# Patient Record
Sex: Male | Born: 1960 | Race: Black or African American | Hispanic: No | Marital: Single
Health system: Southern US, Community
[De-identification: ages and names within clinical notes are randomized; demographics above are authoritative.]

## PROBLEM LIST (undated history)

## (undated) ENCOUNTER — Inpatient Hospital Stay (HOSPITAL_REHABILITATION): Admission: RE | Payer: Self-pay | Admitting: Physical Medicine and Rehabilitation

## (undated) DIAGNOSIS — S069XAA Unspecified intracranial injury with loss of consciousness status unknown, initial encounter: Secondary | ICD-10-CM

## (undated) DIAGNOSIS — M1990 Unspecified osteoarthritis, unspecified site: Secondary | ICD-10-CM

## (undated) DIAGNOSIS — F1910 Other psychoactive substance abuse, uncomplicated: Secondary | ICD-10-CM

## (undated) DIAGNOSIS — G4700 Insomnia, unspecified: Secondary | ICD-10-CM

## (undated) DIAGNOSIS — J302 Other seasonal allergic rhinitis: Secondary | ICD-10-CM

## (undated) DIAGNOSIS — F10129 Alcohol abuse with intoxication, unspecified: Secondary | ICD-10-CM

## (undated) DIAGNOSIS — F32A Depression, unspecified: Secondary | ICD-10-CM

## (undated) DIAGNOSIS — F39 Unspecified mood [affective] disorder: Secondary | ICD-10-CM

## (undated) DIAGNOSIS — F419 Anxiety disorder, unspecified: Secondary | ICD-10-CM

## (undated) DIAGNOSIS — K219 Gastro-esophageal reflux disease without esophagitis: Secondary | ICD-10-CM

## (undated) HISTORY — DX: Anxiety disorder, unspecified: F41.9

## (undated) HISTORY — DX: Other psychoactive substance abuse, uncomplicated: F19.10

## (undated) HISTORY — DX: Insomnia, unspecified: G47.00

## (undated) HISTORY — DX: Unspecified osteoarthritis, unspecified site: M19.90

## (undated) HISTORY — DX: Unspecified intracranial injury with loss of consciousness status unknown, initial encounter: S06.9XAA

## (undated) HISTORY — DX: Alcohol abuse with intoxication, unspecified: F10.129

## (undated) HISTORY — DX: Other seasonal allergic rhinitis: J30.2

## (undated) HISTORY — DX: Unspecified mood (affective) disorder: F39

## (undated) HISTORY — DX: Gastro-esophageal reflux disease without esophagitis: K21.9

## (undated) HISTORY — DX: Depression, unspecified: F32.A

---

## 2005-05-14 ENCOUNTER — Emergency Department (HOSPITAL_COMMUNITY): Admission: EM | Admit: 2005-05-14 | Discharge: 2005-05-14 | Payer: Self-pay | Admitting: Emergency Medicine

## 2005-05-21 ENCOUNTER — Emergency Department (HOSPITAL_COMMUNITY): Admission: EM | Admit: 2005-05-21 | Discharge: 2005-05-21 | Payer: Self-pay | Admitting: Family Medicine

## 2006-05-02 ENCOUNTER — Encounter: Admission: RE | Admit: 2006-05-02 | Discharge: 2006-05-02 | Payer: Self-pay | Admitting: Occupational Medicine

## 2006-09-09 ENCOUNTER — Emergency Department (HOSPITAL_COMMUNITY): Admission: EM | Admit: 2006-09-09 | Discharge: 2006-09-09 | Payer: Self-pay | Admitting: Family Medicine

## 2013-01-31 ENCOUNTER — Emergency Department (HOSPITAL_COMMUNITY): Payer: Self-pay

## 2013-01-31 ENCOUNTER — Emergency Department (HOSPITAL_COMMUNITY)
Admission: EM | Admit: 2013-01-31 | Discharge: 2013-01-31 | Disposition: A | Discharge status: Home / Self Care | Payer: Self-pay | Attending: Emergency Medicine | Admitting: Emergency Medicine

## 2013-01-31 ENCOUNTER — Encounter (HOSPITAL_COMMUNITY): Payer: Self-pay | Admitting: Emergency Medicine

## 2013-01-31 DIAGNOSIS — S0180XA Unspecified open wound of other part of head, initial encounter: Secondary | ICD-10-CM | POA: Insufficient documentation

## 2013-01-31 DIAGNOSIS — F17200 Nicotine dependence, unspecified, uncomplicated: Secondary | ICD-10-CM | POA: Insufficient documentation

## 2013-01-31 DIAGNOSIS — S0285XA Fracture of orbit, unspecified, initial encounter for closed fracture: Secondary | ICD-10-CM

## 2013-01-31 DIAGNOSIS — S02401A Maxillary fracture, unspecified, initial encounter for closed fracture: Secondary | ICD-10-CM

## 2013-01-31 DIAGNOSIS — S02109A Fracture of base of skull, unspecified side, initial encounter for closed fracture: Secondary | ICD-10-CM | POA: Insufficient documentation

## 2013-01-31 DIAGNOSIS — S0280XA Fracture of other specified skull and facial bones, unspecified side, initial encounter for closed fracture: Secondary | ICD-10-CM | POA: Insufficient documentation

## 2013-01-31 LAB — URINALYSIS, ROUTINE W REFLEX MICROSCOPIC
Bilirubin Urine: NEGATIVE
Hgb urine dipstick: NEGATIVE
Specific Gravity, Urine: 1.028 (ref 1.005–1.030)
pH: 5.5 (ref 5.0–8.0)

## 2013-01-31 LAB — URINE MICROSCOPIC-ADD ON

## 2013-01-31 MED ORDER — OXYCODONE-ACETAMINOPHEN 5-325 MG PO TABS
1.0000 | ORAL_TABLET | Freq: Once | ORAL | Status: AC
  Administered 2013-01-31: 1 via ORAL
  Filled 2013-01-31: qty 1

## 2013-01-31 MED ORDER — AMOXICILLIN-POT CLAVULANATE 875-125 MG PO TABS: 1.0000 | ORAL_TABLET | Freq: Two times a day (BID) | ORAL | Status: DC

## 2013-01-31 MED ORDER — HYDROCODONE-ACETAMINOPHEN 5-325 MG PO TABS: 1.0000 | ORAL_TABLET | ORAL | Status: DC | PRN
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
## 2013-01-31 NOTE — ED Provider Notes (Signed)
Medical screening examination/treatment/procedure(s) were conducted as a shared visit with non-physician practitioner(s) and myself.  I personally evaluated the patient during the encounter  No significant displaced fractures that demonstrate need for operative repair at this time.  Outpatient followup with maxillofacial surgery.  Lyanne Co, MD 01/31/13 [phone]
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
## 2015-06-15 ENCOUNTER — Emergency Department (HOSPITAL_COMMUNITY): Payer: Self-pay

## 2015-06-15 ENCOUNTER — Emergency Department (HOSPITAL_COMMUNITY)
Admission: EM | Admit: 2015-06-15 | Discharge: 2015-06-15 | Disposition: A | Discharge status: Home / Self Care | Payer: Self-pay | Attending: Emergency Medicine | Admitting: Emergency Medicine

## 2015-06-15 ENCOUNTER — Encounter (HOSPITAL_COMMUNITY): Payer: Self-pay | Admitting: *Deleted

## 2015-06-15 DIAGNOSIS — S39012A Strain of muscle, fascia and tendon of lower back, initial encounter: Secondary | ICD-10-CM

## 2015-06-15 DIAGNOSIS — Y990 Civilian activity done for income or pay: Secondary | ICD-10-CM | POA: Insufficient documentation

## 2015-06-15 DIAGNOSIS — S8991XA Unspecified injury of right lower leg, initial encounter: Secondary | ICD-10-CM | POA: Insufficient documentation

## 2015-06-15 DIAGNOSIS — Y9289 Other specified places as the place of occurrence of the external cause: Secondary | ICD-10-CM | POA: Insufficient documentation

## 2015-06-15 DIAGNOSIS — F17210 Nicotine dependence, cigarettes, uncomplicated: Secondary | ICD-10-CM | POA: Insufficient documentation

## 2015-06-15 DIAGNOSIS — X500XXA Overexertion from strenuous movement or load, initial encounter: Secondary | ICD-10-CM | POA: Insufficient documentation

## 2015-06-15 DIAGNOSIS — G8929 Other chronic pain: Secondary | ICD-10-CM | POA: Insufficient documentation

## 2015-06-15 DIAGNOSIS — Y9389 Activity, other specified: Secondary | ICD-10-CM | POA: Insufficient documentation

## 2015-06-15 MED ORDER — CYCLOBENZAPRINE HCL 10 MG PO TABS: 10.0000 mg | ORAL_TABLET | Freq: Two times a day (BID) | ORAL | Status: DC | PRN

## 2015-06-15 MED ORDER — NAPROXEN 500 MG PO TABS: 500.0000 mg | ORAL_TABLET | Freq: Two times a day (BID) | ORAL | Status: DC

## 2015-06-15 MED ORDER — CYCLOBENZAPRINE HCL 10 MG PO TABS
10.0000 mg | ORAL_TABLET | Freq: Once | ORAL | Status: AC
  Administered 2015-06-15: 10 mg via ORAL
  Filled 2015-06-15: qty 1

## 2015-06-15 MED ORDER — OXYCODONE-ACETAMINOPHEN 5-325 MG PO TABS: 2.0000 | ORAL_TABLET | ORAL | Status: DC | PRN

## 2015-06-15 MED ORDER — OXYCODONE-ACETAMINOPHEN 5-325 MG PO TABS
1.0000 | ORAL_TABLET | Freq: Once | ORAL | Status: AC
  Administered 2015-06-15: 1 via ORAL
  Filled 2015-06-15: qty 1

## 2015-06-15 NOTE — ED Notes (Signed)
Pt reports while moving a refrigerator  On Tuesday his back started to hurt and the pain goes down the RT leg.

## 2015-06-15 NOTE — ED Notes (Signed)
Declined W/C at D/C and was escorted to lobby by RN. 
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
## 2015-06-15 NOTE — Discharge Instructions (Signed)
Low Back Strain With Rehab A strain is an injury in which a tendon or muscle is torn. The muscles and tendons of the lower back are vulnerable to strains. However, these muscles and tendons are very strong and require a great force to be injured. Strains are classified into three categories. Grade 1 strains cause pain, but the tendon is not lengthened. Grade 2 strains include a lengthened ligament, due to the ligament being stretched or partially ruptured. With grade 2 strains there is still function, although the function may be decreased. Grade 3 strains involve a complete tear of the tendon or muscle, and function is usually impaired. SYMPTOMS   Pain in the lower back.  Pain that affects one side more than the other.  Pain that gets worse with movement and may be felt in the hip, buttocks, or back of the thigh.  Muscle spasms of the muscles in the back.  Swelling along the muscles of the back.  Loss of strength of the back muscles.  Crackling sound (crepitation) when the muscles are touched. CAUSES  Lower back strains occur when a force is placed on the muscles or tendons that is greater than they can handle. Common causes of injury include:  Prolonged overuse of the muscle-tendon units in the lower back, usually from incorrect posture.  A single violent injury or force applied to the back. RISK INCREASES WITH:  Sports that involve twisting forces on the spine or a lot of bending at the waist (football, rugby, weightlifting, bowling, golf, tennis, speed skating, racquetball, swimming, running, gymnastics, diving).  Poor strength and flexibility.  Failure to warm up properly before activity.  Family history of lower back pain or disk disorders.  Previous back injury or surgery (especially fusion).  Poor posture with lifting, especially heavy objects.  Prolonged sitting, especially with poor posture. PREVENTION   Learn and use proper posture when sitting or lifting (maintain  proper posture when sitting, lift using the knees and legs, not at the waist).  Warm up and stretch properly before activity.  Allow for adequate recovery between workouts.  Maintain physical fitness:  Strength, flexibility, and endurance.  Cardiovascular fitness. PROGNOSIS  If treated properly, lower back strains usually heal within 6 weeks. RELATED COMPLICATIONS   Recurring symptoms, resulting in a chronic problem.  Chronic inflammation, scarring, and partial muscle-tendon tear.  Delayed healing or resolution of symptoms.  Prolonged disability. TREATMENT  Treatment first involves the use of ice and medicine, to reduce pain and inflammation. The use of strengthening and stretching exercises may help reduce pain with activity. These exercises may be performed at home or with a therapist. Severe injuries may require referral to a therapist for further evaluation and treatment, such as ultrasound. Your caregiver may advise that you wear a back brace or corset, to help reduce pain and discomfort. Often, prolonged bed rest results in greater harm then benefit. Corticosteroid injections may be recommended. However, these should be reserved for the most serious cases. It is important to avoid using your back when lifting objects. At night, sleep on your back on a firm mattress with a pillow placed under your knees. If non-surgical treatment is unsuccessful, surgery may be needed.  MEDICATION   If pain medicine is needed, nonsteroidal anti-inflammatory medicines (aspirin and ibuprofen), or other minor pain relievers (acetaminophen), are often advised.  Do not take pain medicine for 7 days before surgery.  Prescription pain relievers may be given, if your caregiver thinks they are needed. Use only as  directed and only as much as you need.  Ointments applied to the skin may be helpful.  Corticosteroid injections may be given by your caregiver. These injections should be reserved for the most  serious cases, because they may only be given a certain number of times. HEAT AND COLD  Cold treatment (icing) should be applied for 10 to 15 minutes every 2 to 3 hours for inflammation and pain, and immediately after activity that aggravates your symptoms. Use ice packs or an ice massage.  Heat treatment may be used before performing stretching and strengthening activities prescribed by your caregiver, physical therapist, or athletic trainer. Use a heat pack or a warm water soak. SEEK MEDICAL CARE IF:   Symptoms get worse or do not improve in 2 to 4 weeks, despite treatment.  You develop numbness, weakness, or loss of bowel or bladder function.  New, unexplained symptoms develop. (Drugs used in treatment may produce side effects.) EXERCISES  RANGE OF MOTION (ROM) AND STRETCHING EXERCISES - Low Back Strain Most people with lower back pain will find that their symptoms get worse with excessive bending forward (flexion) or arching at the lower back (extension). The exercises which will help resolve your symptoms will focus on the opposite motion.  Your physician, physical therapist or athletic trainer will help you determine which exercises will be most helpful to resolve your lower back pain. Do not complete any exercises without first consulting with your caregiver. Discontinue any exercises which make your symptoms worse until you speak to your caregiver.  If you have pain, numbness or tingling which travels down into your buttocks, leg or foot, the goal of the therapy is for these symptoms to move closer to your back and eventually resolve. Sometimes, these leg symptoms will get better, but your lower back pain may worsen. This is typically an indication of progress in your rehabilitation. Be very alert to any changes in your symptoms and the activities in which you participated in the 24 hours prior to the change. Sharing this information with your caregiver will allow him/her to most efficiently  treat your condition.  These exercises may help you when beginning to rehabilitate your injury. Your symptoms may resolve with or without further involvement from your physician, physical therapist or athletic trainer. While completing these exercises, remember:  Restoring tissue flexibility helps normal motion to return to the joints. This allows healthier, less painful movement and activity.  An effective stretch should be held for at least 30 seconds.  A stretch should never be painful. You should only feel a gentle lengthening or release in the stretched tissue. FLEXION RANGE OF MOTION AND STRETCHING EXERCISES: STRETCH - Flexion, Single Knee to Chest   Lie on a firm bed or floor with both legs extended in front of you.  Keeping one leg in contact with the floor, bring your opposite knee to your chest. Hold your leg in place by either grabbing behind your thigh or at your knee.  Pull until you feel a gentle stretch in your lower back. Hold __________ seconds.  Slowly release your grasp and repeat the exercise with the opposite side. Repeat __________ times. Complete this exercise __________ times per day.  STRETCH - Flexion, Double Knee to Chest   Lie on a firm bed or floor with both legs extended in front of you.  Keeping one leg in contact with the floor, bring your opposite knee to your chest.  Tense your stomach muscles to support your back and then   lift your other knee to your chest. Hold your legs in place by either grabbing behind your thighs or at your knees.  Pull both knees toward your chest until you feel a gentle stretch in your lower back. Hold __________ seconds.  Tense your stomach muscles and slowly return one leg at a time to the floor. Repeat __________ times. Complete this exercise __________ times per day.  STRETCH - Low Trunk Rotation  Lie on a firm bed or floor. Keeping your legs in front of you, bend your knees so they are both pointed toward the ceiling  and your feet are flat on the floor.  Extend your arms out to the side. This will stabilize your upper body by keeping your shoulders in contact with the floor.  Gently and slowly drop both knees together to one side until you feel a gentle stretch in your lower back. Hold for __________ seconds.  Tense your stomach muscles to support your lower back as you bring your knees back to the starting position. Repeat the exercise to the other side. Repeat __________ times. Complete this exercise __________ times per day  EXTENSION RANGE OF MOTION AND FLEXIBILITY EXERCISES: STRETCH - Extension, Prone on Elbows   Lie on your stomach on the floor, a bed will be too soft. Place your palms about shoulder width apart and at the height of your head.  Place your elbows under your shoulders. If this is too painful, stack pillows under your chest.  Allow your body to relax so that your hips drop lower and make contact more completely with the floor.  Hold this position for __________ seconds.  Slowly return to lying flat on the floor. Repeat __________ times. Complete this exercise __________ times per day.  RANGE OF MOTION - Extension, Prone Press Ups  Lie on your stomach on the floor, a bed will be too soft. Place your palms about shoulder width apart and at the height of your head.  Keeping your back as relaxed as possible, slowly straighten your elbows while keeping your hips on the floor. You may adjust the placement of your hands to maximize your comfort. As you gain motion, your hands will come more underneath your shoulders.  Hold this position __________ seconds.  Slowly return to lying flat on the floor. Repeat __________ times. Complete this exercise __________ times per day.  RANGE OF MOTION- Quadruped, Neutral Spine   Assume a hands and knees position on a firm surface. Keep your hands under your shoulders and your knees under your hips. You may place padding under your knees for  comfort.  Drop your head and point your tail bone toward the ground below you. This will round out your lower back like an angry cat. Hold this position for __________ seconds.  Slowly lift your head and release your tail bone so that your back sags into a large arch, like an old horse.  Hold this position for __________ seconds.  Repeat this until you feel limber in your lower back.  Now, find your "sweet spot." This will be the most comfortable position somewhere between the two previous positions. This is your neutral spine. Once you have found this position, tense your stomach muscles to support your lower back.  Hold this position for __________ seconds. Repeat __________ times. Complete this exercise __________ times per day.  STRENGTHENING EXERCISES - Low Back Strain These exercises may help you when beginning to rehabilitate your injury. These exercises should be done near your "sweet   spot." This is the neutral, low-back arch, somewhere between fully rounded and fully arched, that is your least painful position. When performed in this safe range of motion, these exercises can be used for people who have either a flexion or extension based injury. These exercises may resolve your symptoms with or without further involvement from your physician, physical therapist or athletic trainer. While completing these exercises, remember:  °· Muscles can gain both the endurance and the strength needed for everyday activities through controlled exercises. °· Complete these exercises as instructed by your physician, physical therapist or athletic trainer. Increase the resistance and repetitions only as guided. °· You may experience muscle soreness or fatigue, but the pain or discomfort you are trying to eliminate should never worsen during these exercises. If this pain does worsen, stop and make certain you are following the directions exactly. If the pain is still present after adjustments, discontinue the  exercise until you can discuss the trouble with your caregiver. °STRENGTHENING - Deep Abdominals, Pelvic Tilt °· Lie on a firm bed or floor. Keeping your legs in front of you, bend your knees so they are both pointed toward the ceiling and your feet are flat on the floor. °· Tense your lower abdominal muscles to press your lower back into the floor. This motion will rotate your pelvis so that your tail bone is scooping upwards rather than pointing at your feet or into the floor. °· With a gentle tension and even breathing, hold this position for __________ seconds. °Repeat __________ times. Complete this exercise __________ times per day.  °STRENGTHENING - Abdominals, Crunches  °· Lie on a firm bed or floor. Keeping your legs in front of you, bend your knees so they are both pointed toward the ceiling and your feet are flat on the floor. Cross your arms over your chest. °· Slightly tip your chin down without bending your neck. °· Tense your abdominals and slowly lift your trunk high enough to just clear your shoulder blades. Lifting higher can put excessive stress on the lower back and does not further strengthen your abdominal muscles. °· Control your return to the starting position. °Repeat __________ times. Complete this exercise __________ times per day.  °STRENGTHENING - Quadruped, Opposite UE/LE Lift  °· Assume a hands and knees position on a firm surface. Keep your hands under your shoulders and your knees under your hips. You may place padding under your knees for comfort. °· Find your neutral spine and gently tense your abdominal muscles so that you can maintain this position. Your shoulders and hips should form a rectangle that is parallel with the floor and is not twisted. °· Keeping your trunk steady, lift your right hand no higher than your shoulder and then your left leg no higher than your hip. Make sure you are not holding your breath. Hold this position __________ seconds. °· Continuing to keep your  abdominal muscles tense and your back steady, slowly return to your starting position. Repeat with the opposite arm and leg. °Repeat __________ times. Complete this exercise __________ times per day.  °STRENGTHENING - Lower Abdominals, Double Knee Lift °· Lie on a firm bed or floor. Keeping your legs in front of you, bend your knees so they are both pointed toward the ceiling and your feet are flat on the floor. °· Tense your abdominal muscles to brace your lower back and slowly lift both of your knees until they come over your hips. Be certain not to hold your breath. °·   Hold __________ seconds. Using your abdominal muscles, return to the starting position in a slow and controlled manner. Repeat __________ times. Complete this exercise __________ times per day.  POSTURE AND BODY MECHANICS CONSIDERATIONS - Low Back Strain Keeping correct posture when sitting, standing or completing your activities will reduce the stress put on different body tissues, allowing injured tissues a chance to heal and limiting painful experiences. The following are general guidelines for improved posture. Your physician or physical therapist will provide you with any instructions specific to your needs. While reading these guidelines, remember:  The exercises prescribed by your provider will help you have the flexibility and strength to maintain correct postures.  The correct posture provides the best environment for your joints to work. All of your joints have less wear and tear when properly supported by a spine with good posture. This means you will experience a healthier, less painful body.  Correct posture must be practiced with all of your activities, especially prolonged sitting and standing. Correct posture is as important when doing repetitive low-stress activities (typing) as it is when doing a single heavy-load activity (lifting). RESTING POSITIONS Consider which positions are most painful for you when choosing a  resting position. If you have pain with flexion-based activities (sitting, bending, stooping, squatting), choose a position that allows you to rest in a less flexed posture. You would want to avoid curling into a fetal position on your side. If your pain worsens with extension-based activities (prolonged standing, working overhead), avoid resting in an extended position such as sleeping on your stomach. Most people will find more comfort when they rest with their spine in a more neutral position, neither too rounded nor too arched. Lying on a non-sagging bed on your side with a pillow between your knees, or on your back with a pillow under your knees will often provide some relief. Keep in mind, being in any one position for a prolonged period of time, no matter how correct your posture, can still lead to stiffness. PROPER SITTING POSTURE In order to minimize stress and discomfort on your spine, you must sit with correct posture. Sitting with good posture should be effortless for a healthy body. Returning to good posture is a gradual process. Many people can work toward this most comfortably by using various supports until they have the flexibility and strength to maintain this posture on their own. When sitting with proper posture, your ears will fall over your shoulders and your shoulders will fall over your hips. You should use the back of the chair to support your upper back. Your lower back will be in a neutral position, just slightly arched. You may place a small pillow or folded towel at the base of your lower back for support.  When working at a desk, create an environment that supports good, upright posture. Without extra support, muscles tire, which leads to excessive strain on joints and other tissues. Keep these recommendations in mind: CHAIR:  A chair should be able to slide under your desk when your back makes contact with the back of the chair. This allows you to work closely.  The chair's  height should allow your eyes to be level with the upper part of your monitor and your hands to be slightly lower than your elbows. BODY POSITION  Your feet should make contact with the floor. If this is not possible, use a foot rest.  Keep your ears over your shoulders. This will reduce stress on your neck and  lower back. INCORRECT SITTING POSTURES  If you are feeling tired and unable to assume a healthy sitting posture, do not slouch or slump. This puts excessive strain on your back tissues, causing more damage and pain. Healthier options include:  Using more support, like a lumbar pillow.  Switching tasks to something that requires you to be upright or walking.  Talking a brief walk.  Lying down to rest in a neutral-spine position. PROLONGED STANDING WHILE SLIGHTLY LEANING FORWARD  When completing a task that requires you to lean forward while standing in one place for a long time, place either foot up on a stationary 2-4 inch high object to help maintain the best posture. When both feet are on the ground, the lower back tends to lose its slight inward curve. If this curve flattens (or becomes too large), then the back and your other joints will experience too much stress, tire more quickly, and can cause pain. CORRECT STANDING POSTURES Proper standing posture should be assumed with all daily activities, even if they only take a few moments, like when brushing your teeth. As in sitting, your ears should fall over your shoulders and your shoulders should fall over your hips. You should keep a slight tension in your abdominal muscles to brace your spine. Your tailbone should point down to the ground, not behind your body, resulting in an over-extended swayback posture.  INCORRECT STANDING POSTURES  Common incorrect standing postures include a forward head, locked knees and/or an excessive swayback. WALKING Walk with an upright posture. Your ears, shoulders and hips should all  line-up. PROLONGED ACTIVITY IN A FLEXED POSITION When completing a task that requires you to bend forward at your waist or lean over a low surface, try to find a way to stabilize 3 out of 4 of your limbs. You can place a hand or elbow on your thigh or rest a knee on the surface you are reaching across. This will provide you more stability so that your muscles do not fatigue as quickly. By keeping your knees relaxed, or slightly bent, you will also reduce stress across your lower back. CORRECT LIFTING TECHNIQUES DO :   Assume a wide stance. This will provide you more stability and the opportunity to get as close as possible to the object which you are lifting.  Tense your abdominals to brace your spine. Bend at the knees and hips. Keeping your back locked in a neutral-spine position, lift using your leg muscles. Lift with your legs, keeping your back straight.  Test the weight of unknown objects before attempting to lift them.  Try to keep your elbows locked down at your sides in order get the best strength from your shoulders when carrying an object.  Always ask for help when lifting heavy or awkward objects. INCORRECT LIFTING TECHNIQUES DO NOT:   Lock your knees when lifting, even if it is a small object.  Bend and twist. Pivot at your feet or move your feet when needing to change directions.  Assume that you can safely pick up even a paper clip without proper posture.   This information is not intended to replace advice given to you by your health care provider. Make sure you discuss any questions you have with your health care provider.  Take pain medication and muscle relaxers as needed. Apply ice to affected area. Avoid heavy lifting or strenuous activity to allow your back muscles to heal. Follow-up with [facility] for symptoms do not  improve. Return to the emergency department if you experience severe worsening of her symptoms, numbness or tingling in  both of your lower extremities, loss of bowel or bladder control, fever, weakness.

## 2017-03-03 ENCOUNTER — Emergency Department (HOSPITAL_COMMUNITY): Payer: Self-pay

## 2017-03-03 ENCOUNTER — Encounter (HOSPITAL_COMMUNITY): Payer: Self-pay | Admitting: Emergency Medicine

## 2017-03-03 ENCOUNTER — Other Ambulatory Visit: Payer: Self-pay

## 2017-03-03 ENCOUNTER — Emergency Department (HOSPITAL_COMMUNITY)
Admission: EM | Admit: 2017-03-03 | Discharge: 2017-03-03 | Disposition: A | Discharge status: Home / Self Care | Payer: Self-pay | Attending: Physician Assistant | Admitting: Physician Assistant

## 2017-03-03 DIAGNOSIS — F17210 Nicotine dependence, cigarettes, uncomplicated: Secondary | ICD-10-CM | POA: Insufficient documentation

## 2017-03-03 DIAGNOSIS — Z79899 Other long term (current) drug therapy: Secondary | ICD-10-CM | POA: Insufficient documentation

## 2017-03-03 DIAGNOSIS — M5441 Lumbago with sciatica, right side: Secondary | ICD-10-CM | POA: Insufficient documentation

## 2017-03-03 MED ORDER — METHYLPREDNISOLONE 4 MG PO TBPK
ORAL_TABLET | ORAL | 0 refills | Status: DC
Start: 2017-03-03 — End: 2021-11-13

## 2017-03-03 MED ORDER — OXYCODONE-ACETAMINOPHEN 5-325 MG PO TABS
1.0000 | ORAL_TABLET | Freq: Once | ORAL | Status: AC
  Administered 2017-03-03: 1 via ORAL
  Filled 2017-03-03: qty 1

## 2017-03-03 MED ORDER — CYCLOBENZAPRINE HCL 10 MG PO TABS
10.0000 mg | ORAL_TABLET | Freq: Once | ORAL | Status: AC
  Administered 2017-03-03: 10 mg via ORAL
  Filled 2017-03-03: qty 1

## 2017-03-03 MED ORDER — PREDNISONE 20 MG PO TABS
40.0000 mg | ORAL_TABLET | Freq: Once | ORAL | Status: AC
  Administered 2017-03-03: 40 mg via ORAL
  Filled 2017-03-03: qty 2

## 2017-03-03 MED ORDER — CYCLOBENZAPRINE HCL 10 MG PO TABS: 10.0000 mg | ORAL_TABLET | Freq: Two times a day (BID) | ORAL | 0 refills | Status: DC | PRN

## 2017-03-03 NOTE — ED Provider Notes (Signed)
[facility] Note   CSN: [id number] Arrival date & time: 03/03/17  1551     History   Chief Complaint Chief Complaint  Patient presents with  . Back Pain    HPI  Bill Brooks is a 56 y.o. Male with a history of chronic low back pain, presents complaining of right-sided back pain that radiates down his right leg.  Patient reports he works at [employer] and was lifting heavy boxes at work 2-3 days ago and since then he has been having worsening pain.  He describes pain as a constant ache that has been worsening, is tried Tylenol and heating pads with mild improvement but no complete relief.  Patient reports particularly with movement and forward bending pain shoots down his right leg.  He denies numbness, weakness or tingling of the right leg.  Reports he is been able to walk but it is painful.  Symptoms at all on the left side of the back or radiating down to the left leg. Denies weakness, numbness, loss of bowel/bladder function or saddle anesthesia.  Denies fevers or chills.  No abdominal pain, dysuria or frequency.  Patient denies any history of cancer or IV drug use.      History reviewed. No pertinent past medical history.  There are no active problems to display for this patient.   History reviewed. No pertinent surgical history.     Home Medications    Prior to Admission medications   Medication Sig Start Date End Date Taking? Authorizing Provider  amoxicillin-clavulanate (AUGMENTIN) 875-125 MG per tablet Take 1 tablet by mouth every 12 (twelve) hours. 01/31/13   Schinlever, Santina Evansatherine, PA-C  cyclobenzaprine (FLEXERIL) 10 MG tablet Take 1 tablet (10 mg total) by mouth 2 (two) times daily as needed for muscle spasms. 06/15/15   Dowless, Lelon MastSamantha Tripp, PA-C  HYDROcodone-acetaminophen (NORCO/VICODIN) 5-325 MG per tablet Take 1 tablet by mouth every 4 (four) hours as needed for moderate pain. 01/31/13   Schinlever, Santina Evansatherine, PA-C    naproxen (NAPROSYN) 500 MG tablet Take 1 tablet (500 mg total) by mouth 2 (two) times daily. 06/15/15   Dowless, Lelon MastSamantha Tripp, PA-C  oxyCODONE-acetaminophen (PERCOCET/ROXICET) 5-325 MG tablet Take 2 tablets by mouth every 4 (four) hours as needed for severe pain. 06/15/15   Dowless, Lester KinsmanSamantha Tripp, PA-C    Family History No family history on file.  Social History Social History   Tobacco Use  . Smoking status: Current Every Day Smoker    Packs/day: 1.00    Types: Cigarettes  . Smokeless tobacco: Never Used  Substance Use Topics  . Alcohol use: Yes    Alcohol/week: 3.6 oz    Types: 6 Cans of beer per week    Comment: daily  . Drug use: Yes    Types: Cocaine     Allergies   Patient has no known allergies.   Review of Systems Review of Systems  Constitutional: Negative for chills and fever.  Gastrointestinal: Negative for abdominal pain, constipation, diarrhea, nausea and vomiting.  Genitourinary: Negative for dysuria, flank pain and frequency.  Musculoskeletal: Positive for back pain and myalgias. Negative for arthralgias, gait problem, joint swelling, neck pain and neck stiffness.  Skin: Negative for color change and rash.  Neurological: Negative for weakness and numbness.     Physical Exam Updated Vital Signs BP 122/88 (BP Location: Right Arm)   Pulse 71   Temp 98.4 F (36.9 C) (Oral)   Resp 18   Ht 6\' 1"  (  1.854 m)   Wt 92.1 kg (203 lb)   SpO2 97%   BMI 26.78 kg/m   Physical Exam  Constitutional: He is oriented to person, place, and time. He appears well-developed and well-nourished. No distress.  HENT:  Head: Normocephalic and atraumatic.  Eyes: Right eye exhibits no discharge. Left eye exhibits no discharge.  Cardiovascular:  Pulses:      Dorsalis pedis pulses are 2+ on the right side, and 2+ on the left side.       Posterior tibial pulses are 2+ on the right side, and 2+ on the left side.  Pulmonary/Chest: Effort normal. No respiratory distress.   Musculoskeletal:  Tenderness to palpation at midline of the lower back extending across the right side of the lower back and down into the buttock, no tenderness over the left lower back, no erythema or warmth, pain is worsened with movement of the lower extremities especially on the right side, positive right straight leg raise  Neurological: He is alert and oriented to person, place, and time. Coordination normal.  Speech is clear, able to follow commands CN III-XII intact Normal strength in upper and lower extremities bilaterally including dorsiflexion and plantar flexion, strong and equal grip strength Sensation normal to light and sharp touch Moves extremities without ataxia, coordination intact  Skin: He is not diaphoretic.  Psychiatric: He has a normal mood and affect. His behavior is normal.  Nursing note and vitals reviewed.    ED Treatments / Results  Labs (all labs ordered are listed, but only abnormal results are displayed) Labs Reviewed - No data to display  EKG  EKG Interpretation None       Radiology No results found.  Procedures Procedures (including critical care time)  Medications Ordered in ED Medications  oxyCODONE-acetaminophen (PERCOCET/ROXICET) 5-325 MG per tablet 1 tablet (1 tablet Oral Given 03/03/17 1735)  cyclobenzaprine (FLEXERIL) tablet 10 mg (10 mg Oral Given 03/03/17 1735)  oxyCODONE-acetaminophen (PERCOCET/ROXICET) 5-325 MG per tablet 1 tablet (1 tablet Oral Given 03/03/17 1852)  predniSONE (DELTASONE) tablet 40 mg (40 mg Oral Given 03/03/17 1852)     Initial Impression / Assessment and Plan / ED Course  I have reviewed the triage vital signs and the nursing notes.  Pertinent labs & imaging results that were available during my care of the patient were reviewed by me and considered in my medical decision making (see chart for details).  Patient with back pain.  No neurological deficits and normal neuro exam.  Patient can walk but states  is painful.  No loss of bowel or bladder control.  No concern for cauda equina.  No fever, night sweats, weight loss, h/o cancer, IVDU.  Pain improved with medication here in the ED.  RICE protocol and pain medicine indicated and discussed with patient.  Sciatic component will also discharged with steroid taper.  [facility].  Strict return precautions discussed.   Final Clinical Impressions(s) / ED Diagnoses   Final diagnoses:  Acute right-sided low back pain with right-sided sciatica    ED Discharge Orders        Ordered    cyclobenzaprine (FLEXERIL) 10 MG tablet  2 times daily PRN     03/03/17 1858    methylPREDNISolone (MEDROL DOSEPAK) 4 MG TBPK tablet     03/03/17 1858       Dartha LodgeFord, Kelsey N, [place]PA-C 03/03/17 1906    Abelino DerrickMackuen, Courteney Lyn, MD 03/05/17 2215

## 2017-03-03 NOTE — ED Triage Notes (Signed)
C/o lower back pain that radiates down R leg since lifting boxes at work 2-3 days ago.  Reports history of chronic back pain.

## 2017-03-03 NOTE — ED Notes (Signed)
Pt transported to xray 

## 2017-03-03 NOTE — Discharge Instructions (Signed)
Please continue to use Tylenol and Flexeril for pain, complete steroid Dosepak as directed.  Please call Monday morning to schedule [facility].  Also encourage you to use ice and heat, and light stretching.  If you have significantly worsening pain, difficulty walking, numbness or weakness in the legs or problems controlling you bowel or bladder please return to the ED for reevaluation.

## 2018-07-03 NOTE — Progress Notes (Signed)
COVID Hotel Screening performed. Temperature, PHQ-9, and need for medical care and medications assessed. No additional needs assessed at this time.  Tadhg Eskew RN MSN 

## 2018-07-10 NOTE — Progress Notes (Signed)
COVID Hotel Screening performed. Temperature, PHQ-9, and need for medical care and medications assessed.Patient struggles with pain. Reports that he has applied for an [employer] twice now but has not heard back. Gave them his sister's contact number, but she has since changed her number and he does not have the new number. Referral made to [facility].  Carlyle Basques RN MSN
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
## 2018-07-23 NOTE — Progress Notes (Signed)
COVID Hotel Screening performed. Temperature, PHQ-9, and need for medical care and medications assessed. Patient continues to complain of increased pain in lower back that radiates down his legs. Referral to [facility] last week. Patient reports that he has not heard back from her. Patient instructed to go to ED with increasing pain and numbness in legs and back. Referral to [facility]  Carlyle Basques RN MSN

## 2018-08-07 ENCOUNTER — Other Ambulatory Visit (HOSPITAL_COMMUNITY): Payer: Self-pay

## 2018-08-07 DIAGNOSIS — Z20822 Contact with and (suspected) exposure to covid-19: Secondary | ICD-10-CM

## 2018-08-08 ENCOUNTER — Other Ambulatory Visit: Payer: Self-pay

## 2018-08-08 DIAGNOSIS — Z20822 Contact with and (suspected) exposure to covid-19: Secondary | ICD-10-CM

## 2018-08-08 NOTE — Progress Notes (Signed)
lab

## 2018-08-08 NOTE — Addendum Note (Signed)
Addended by: Leonia Corona on: 08/08/2018 11:41 AM   Modules accepted: Orders

## 2020-01-08 ENCOUNTER — Emergency Department (HOSPITAL_COMMUNITY): Payer: No Typology Code available for payment source

## 2020-01-08 ENCOUNTER — Other Ambulatory Visit: Payer: Self-pay

## 2020-01-08 ENCOUNTER — Encounter (HOSPITAL_COMMUNITY): Payer: Self-pay

## 2020-01-08 ENCOUNTER — Inpatient Hospital Stay (HOSPITAL_COMMUNITY)
Admission: EM | Admit: 2020-01-08 | Discharge: 2020-01-11 | DRG: 184 | Disposition: A | Discharge status: Home / Self Care | Payer: No Typology Code available for payment source | Attending: Surgery | Admitting: Surgery

## 2020-01-08 DIAGNOSIS — S0181XA Laceration without foreign body of other part of head, initial encounter: Secondary | ICD-10-CM | POA: Diagnosis present

## 2020-01-08 DIAGNOSIS — S2242XA Multiple fractures of ribs, left side, initial encounter for closed fracture: Secondary | ICD-10-CM | POA: Diagnosis not present

## 2020-01-08 DIAGNOSIS — Y92410 Unspecified street and highway as the place of occurrence of the external cause: Secondary | ICD-10-CM

## 2020-01-08 DIAGNOSIS — S22049A Unspecified fracture of fourth thoracic vertebra, initial encounter for closed fracture: Secondary | ICD-10-CM | POA: Diagnosis present

## 2020-01-08 DIAGNOSIS — M25532 Pain in left wrist: Secondary | ICD-10-CM | POA: Diagnosis present

## 2020-01-08 DIAGNOSIS — S01511A Laceration without foreign body of lip, initial encounter: Secondary | ICD-10-CM | POA: Diagnosis present

## 2020-01-08 DIAGNOSIS — S22039A Unspecified fracture of third thoracic vertebra, initial encounter for closed fracture: Secondary | ICD-10-CM | POA: Diagnosis present

## 2020-01-08 DIAGNOSIS — M79672 Pain in left foot: Secondary | ICD-10-CM

## 2020-01-08 DIAGNOSIS — S2239XA Fracture of one rib, unspecified side, initial encounter for closed fracture: Secondary | ICD-10-CM

## 2020-01-08 DIAGNOSIS — S2249XA Multiple fractures of ribs, unspecified side, initial encounter for closed fracture: Secondary | ICD-10-CM | POA: Diagnosis present

## 2020-01-08 DIAGNOSIS — M25572 Pain in left ankle and joints of left foot: Secondary | ICD-10-CM | POA: Diagnosis present

## 2020-01-08 DIAGNOSIS — J90 Pleural effusion, not elsewhere classified: Secondary | ICD-10-CM | POA: Diagnosis present

## 2020-01-08 DIAGNOSIS — T1490XA Injury, unspecified, initial encounter: Secondary | ICD-10-CM

## 2020-01-08 DIAGNOSIS — R0781 Pleurodynia: Secondary | ICD-10-CM | POA: Diagnosis not present

## 2020-01-08 DIAGNOSIS — Z20822 Contact with and (suspected) exposure to covid-19: Secondary | ICD-10-CM | POA: Diagnosis present

## 2020-01-08 DIAGNOSIS — S32119A Unspecified Zone I fracture of sacrum, initial encounter for closed fracture: Secondary | ICD-10-CM | POA: Diagnosis present

## 2020-01-08 DIAGNOSIS — Z23 Encounter for immunization: Secondary | ICD-10-CM

## 2020-01-08 DIAGNOSIS — M79643 Pain in unspecified hand: Secondary | ICD-10-CM

## 2020-01-08 DIAGNOSIS — S301XXA Contusion of abdominal wall, initial encounter: Secondary | ICD-10-CM | POA: Diagnosis present

## 2020-01-08 LAB — COMPREHENSIVE METABOLIC PANEL
ALT: 29 U/L (ref 0–44)
AST: 32 U/L (ref 15–41)
Albumin: 3.8 g/dL (ref 3.5–5.0)
Alkaline Phosphatase: 54 U/L (ref 38–126)
Anion gap: 7 (ref 5–15)
BUN: 14 mg/dL (ref 6–20)
CO2: 26 mmol/L (ref 22–32)
Calcium: 8.9 mg/dL (ref 8.9–10.3)
Chloride: 108 mmol/L (ref 98–111)
Creatinine, Ser: 1.11 mg/dL (ref 0.61–1.24)
GFR, Estimated: >60 mL/min (ref >60)
Glucose, Bld: 110 mg/dL — ABNORMAL HIGH (ref 70–99)
Potassium: 3.9 mmol/L (ref 3.5–5.1)
Sodium: 141 mmol/L (ref 135–145)
Total Bilirubin: 0.4 mg/dL (ref 0.3–1.2)
Total Protein: 7.3 g/dL (ref 6.5–8.1)

## 2020-01-08 LAB — CBC
HCT: 43.5 % (ref 39.0–52.0)
HCT: 42.2 % (ref 39.0–52.0)
Hemoglobin: 13.6 g/dL (ref 13.0–17.0)
Hemoglobin: 13.2 g/dL (ref 13.0–17.0)
MCH: 28.5 pg (ref 26.0–34.0)
MCH: 28 pg (ref 26.0–34.0)
MCHC: 31.3 g/dL (ref 30.0–36.0)
MCHC: 31.3 g/dL (ref 30.0–36.0)
MCV: 91.2 fL (ref 80.0–100.0)
MCV: 89.6 fL (ref 80.0–100.0)
Platelets: 265 10*3/uL (ref 150–400)
Platelets: 265 10*3/uL (ref 150–400)
RBC: 4.77 MIL/uL (ref 4.22–5.81)
RBC: 4.71 MIL/uL (ref 4.22–5.81)
RDW: 15.5 % (ref 11.5–15.5)
RDW: 15.5 % (ref 11.5–15.5)
WBC: 6 10*3/uL (ref 4.0–10.5)
WBC: 9.2 10*3/uL (ref 4.0–10.5)
nRBC: 0 % (ref 0.0–0.2)
nRBC: 0 % (ref 0.0–0.2)

## 2020-01-08 LAB — I-STAT CHEM 8, ED
BUN: 17 mg/dL (ref 6–20)
Calcium, Ion: 1.1 mmol/L — ABNORMAL LOW (ref 1.15–1.40)
Chloride: 109 mmol/L (ref 98–111)
Creatinine, Ser: 1.1 mg/dL (ref 0.61–1.24)
Glucose, Bld: 147 mg/dL — ABNORMAL HIGH (ref 70–99)
HCT: 44 % (ref 39.0–52.0)
Hemoglobin: 15 g/dL (ref 13.0–17.0)
Potassium: 3.9 mmol/L (ref 3.5–5.1)
Sodium: 143 mmol/L (ref 135–145)
TCO2: 24 mmol/L (ref 22–32)

## 2020-01-08 LAB — COMPREHENSIVE METABOLIC PANEL WITH GFR
ALT: 30 U/L (ref 0–44)
AST: 31 U/L (ref 15–41)
Albumin: 3.7 g/dL (ref 3.5–5.0)
Alkaline Phosphatase: 65 U/L (ref 38–126)
Anion gap: 7 (ref 5–15)
BUN: 15 mg/dL (ref 6–20)
CO2: 26 mmol/L (ref 22–32)
Calcium: 9.1 mg/dL (ref 8.9–10.3)
Chloride: 109 mmol/L (ref 98–111)
Creatinine, Ser: 1.21 mg/dL (ref 0.61–1.24)
GFR, Estimated: >60 mL/min
Glucose, Bld: 153 mg/dL — ABNORMAL HIGH (ref 70–99)
Potassium: 3.9 mmol/L (ref 3.5–5.1)
Sodium: 142 mmol/L (ref 135–145)
Total Bilirubin: 0.3 mg/dL (ref 0.3–1.2)
Total Protein: 7.3 g/dL (ref 6.5–8.1)

## 2020-01-08 LAB — MAGNESIUM: Magnesium: 2 mg/dL (ref 1.7–2.4)

## 2020-01-08 LAB — RESPIRATORY PANEL BY RT PCR (FLU A&B, COVID)
Influenza A by PCR: NEGATIVE
Influenza B by PCR: NEGATIVE
SARS Coronavirus 2 by RT PCR: NEGATIVE

## 2020-01-08 LAB — HIV ANTIBODY (ROUTINE TESTING W REFLEX): HIV Screen 4th Generation wRfx: NONREACTIVE

## 2020-01-08 LAB — LACTIC ACID, PLASMA: Lactic Acid, Venous: 1.3 mmol/L (ref 0.5–1.9)

## 2020-01-08 LAB — PHOSPHORUS: Phosphorus: 2.5 mg/dL (ref 2.5–4.6)

## 2020-01-08 LAB — ETHANOL: Alcohol, Ethyl (B): <10 mg/dL

## 2020-01-08 LAB — PROTIME-INR
INR: 1 (ref 0.8–1.2)
Prothrombin Time: 12.3 s (ref 11.4–15.2)

## 2020-01-08 LAB — SAMPLE TO BLOOD BANK

## 2020-01-08 MED ORDER — DOCUSATE SODIUM 100 MG PO CAPS
100.0000 mg | ORAL_CAPSULE | Freq: Two times a day (BID) | ORAL | Status: DC
  Administered 2020-01-08 – 2020-01-11 (×6): 100 mg via ORAL
  Filled 2020-01-08 (×6): qty 1

## 2020-01-08 MED ORDER — ONDANSETRON HCL 4 MG/2ML IJ SOLN: 4.0000 mg | Freq: Four times a day (QID) | INTRAMUSCULAR | Status: DC | PRN

## 2020-01-08 MED ORDER — THIAMINE HCL 100 MG/ML IJ SOLN: 100.0000 mg | Freq: Every day | INTRAMUSCULAR | Status: DC

## 2020-01-08 MED ORDER — LORAZEPAM 2 MG/ML IJ SOLN: 1.0000 mg | INTRAMUSCULAR | Status: DC | PRN

## 2020-01-08 MED ORDER — HYDROMORPHONE HCL 1 MG/ML IJ SOLN
1.0000 mg | Freq: Once | INTRAMUSCULAR | Status: AC
  Administered 2020-01-08: 1 mg via INTRAVENOUS
  Filled 2020-01-08: qty 1

## 2020-01-08 MED ORDER — MORPHINE SULFATE (PF) 2 MG/ML IV SOLN: 1.0000 mg | INTRAVENOUS | Status: DC | PRN

## 2020-01-08 MED ORDER — MORPHINE SULFATE (PF) 2 MG/ML IV SOLN
2.0000 mg | INTRAVENOUS | Status: DC | PRN
  Administered 2020-01-08 – 2020-01-10 (×2): 2 mg via INTRAVENOUS
  Filled 2020-01-08 (×2): qty 1

## 2020-01-08 MED ORDER — SODIUM CHLORIDE 0.9 % IV SOLN: INTRAVENOUS | Status: DC

## 2020-01-08 MED ORDER — FENTANYL CITRATE (PF) 100 MCG/2ML IJ SOLN
50.0000 ug | Freq: Once | INTRAMUSCULAR | Status: AC
  Administered 2020-01-08: 50 ug via INTRAVENOUS
  Filled 2020-01-08: qty 2

## 2020-01-08 MED ORDER — IOHEXOL 300 MG/ML  SOLN
100.0000 mL | Freq: Once | INTRAMUSCULAR | Status: AC | PRN
  Administered 2020-01-08: 100 mL via INTRAVENOUS

## 2020-01-08 MED ORDER — METOPROLOL TARTRATE 5 MG/5ML IV SOLN: 5.0000 mg | Freq: Four times a day (QID) | INTRAVENOUS | Status: DC | PRN

## 2020-01-08 MED ORDER — OXYCODONE HCL 5 MG PO TABS: 5.0000 mg | ORAL_TABLET | ORAL | Status: DC | PRN

## 2020-01-08 MED ORDER — THIAMINE HCL 100 MG PO TABS
100.0000 mg | ORAL_TABLET | Freq: Every day | ORAL | Status: DC
  Administered 2020-01-09 – 2020-01-11 (×3): 100 mg via ORAL
  Filled 2020-01-08 (×3): qty 1

## 2020-01-08 MED ORDER — LORAZEPAM 1 MG PO TABS: 1.0000 mg | ORAL_TABLET | ORAL | Status: DC | PRN

## 2020-01-08 MED ORDER — ENOXAPARIN SODIUM 30 MG/0.3ML ~~LOC~~ SOLN
30.0000 mg | Freq: Two times a day (BID) | SUBCUTANEOUS | Status: DC
  Administered 2020-01-09 – 2020-01-10 (×4): 30 mg via SUBCUTANEOUS
  Filled 2020-01-08 (×5): qty 0.3

## 2020-01-08 MED ORDER — ACETAMINOPHEN 500 MG PO TABS
1000.0000 mg | ORAL_TABLET | Freq: Four times a day (QID) | ORAL | Status: DC
  Administered 2020-01-08 – 2020-01-11 (×11): 1000 mg via ORAL
  Filled 2020-01-08 (×11): qty 2

## 2020-01-08 MED ORDER — ONDANSETRON 4 MG PO TBDP: 4.0000 mg | ORAL_TABLET | Freq: Four times a day (QID) | ORAL | Status: DC | PRN

## 2020-01-08 MED ORDER — MORPHINE SULFATE (PF) 4 MG/ML IV SOLN: 4.0000 mg | INTRAVENOUS | Status: DC | PRN

## 2020-01-08 MED ORDER — SODIUM CHLORIDE 0.9 % IV BOLUS
500.0000 mL | Freq: Once | INTRAVENOUS | Status: AC
  Administered 2020-01-08: 500 mL via INTRAVENOUS

## 2020-01-08 MED ORDER — FOLIC ACID 1 MG PO TABS
1.0000 mg | ORAL_TABLET | Freq: Every day | ORAL | Status: DC
  Administered 2020-01-09 – 2020-01-11 (×3): 1 mg via ORAL
  Filled 2020-01-08 (×3): qty 1

## 2020-01-08 MED ORDER — TETANUS-DIPHTH-ACELL PERTUSSIS 5-2.5-18.5 LF-MCG/0.5 IM SUSY
0.5000 mL | PREFILLED_SYRINGE | Freq: Once | INTRAMUSCULAR | Status: AC
  Administered 2020-01-08: 0.5 mL via INTRAMUSCULAR
  Filled 2020-01-08: qty 0.5

## 2020-01-08 MED ORDER — OXYCODONE HCL 5 MG PO TABS
10.0000 mg | ORAL_TABLET | ORAL | Status: DC | PRN
  Administered 2020-01-08 – 2020-01-11 (×10): 10 mg via ORAL
  Filled 2020-01-08 (×10): qty 2

## 2020-01-08 MED ORDER — ONDANSETRON HCL 4 MG/2ML IJ SOLN
4.0000 mg | Freq: Once | INTRAMUSCULAR | Status: AC
  Administered 2020-01-08: 4 mg via INTRAVENOUS
  Filled 2020-01-08: qty 2

## 2020-01-08 MED ORDER — ADULT MULTIVITAMIN W/MINERALS CH
1.0000 | ORAL_TABLET | Freq: Every day | ORAL | Status: DC
  Administered 2020-01-09 – 2020-01-11 (×3): 1 via ORAL
  Filled 2020-01-08 (×3): qty 1
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
## 2020-01-08 NOTE — ED Notes (Signed)
Pt transported to CT ?

## 2020-01-08 NOTE — Progress Notes (Signed)
Bill Brooks. is a 59 y.o. male patient admitted. Awake, alert - oriented  X 4 - no acute distress noted.  VSS - Blood pressure 110/68, pulse 78, temperature 98.6 F (37 C), temperature source Oral, resp. rate 19, height 6\' 1"  (1.854 m), weight 81.6 kg, SpO2 96 %.    IV in place, occlusive dsg intact without redness.  Orientation to room, and floor completed.  Admission INP armband ID verified with patient/family, and in place.   SR up x 2, fall assessment complete, with patient and family able to verbalize understanding of risk associated with falls, and verbalized understanding to call nsg before up out of bed.  Call light within reach, patient able to voice, and demonstrate understanding. Laceration above left eye cleansed, back of head has 6 staples cleansed area, and cleansed laceration above lip.No evidence of skin breakdown.  Admission nurse notified of admission.     Will cont to eval and treat per MD orders.  , RN 01/08/2020 8:23 PM
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
## 2020-01-09 ENCOUNTER — Observation Stay (HOSPITAL_COMMUNITY): Payer: No Typology Code available for payment source

## 2020-01-09 DIAGNOSIS — S32119A Unspecified Zone I fracture of sacrum, initial encounter for closed fracture: Secondary | ICD-10-CM | POA: Diagnosis present

## 2020-01-09 DIAGNOSIS — S22049A Unspecified fracture of fourth thoracic vertebra, initial encounter for closed fracture: Secondary | ICD-10-CM | POA: Diagnosis present

## 2020-01-09 DIAGNOSIS — R0781 Pleurodynia: Secondary | ICD-10-CM | POA: Diagnosis present

## 2020-01-09 DIAGNOSIS — Y92410 Unspecified street and highway as the place of occurrence of the external cause: Secondary | ICD-10-CM | POA: Diagnosis not present

## 2020-01-09 DIAGNOSIS — S2242XA Multiple fractures of ribs, left side, initial encounter for closed fracture: Secondary | ICD-10-CM | POA: Diagnosis present

## 2020-01-09 DIAGNOSIS — J90 Pleural effusion, not elsewhere classified: Secondary | ICD-10-CM | POA: Diagnosis present

## 2020-01-09 DIAGNOSIS — S2249XA Multiple fractures of ribs, unspecified side, initial encounter for closed fracture: Secondary | ICD-10-CM | POA: Diagnosis present

## 2020-01-09 DIAGNOSIS — Z23 Encounter for immunization: Secondary | ICD-10-CM | POA: Diagnosis not present

## 2020-01-09 DIAGNOSIS — M25532 Pain in left wrist: Secondary | ICD-10-CM | POA: Diagnosis present

## 2020-01-09 DIAGNOSIS — S22039A Unspecified fracture of third thoracic vertebra, initial encounter for closed fracture: Secondary | ICD-10-CM | POA: Diagnosis present

## 2020-01-09 DIAGNOSIS — S01511A Laceration without foreign body of lip, initial encounter: Secondary | ICD-10-CM | POA: Diagnosis present

## 2020-01-09 DIAGNOSIS — M25572 Pain in left ankle and joints of left foot: Secondary | ICD-10-CM | POA: Diagnosis present

## 2020-01-09 DIAGNOSIS — Z20822 Contact with and (suspected) exposure to covid-19: Secondary | ICD-10-CM | POA: Diagnosis present

## 2020-01-09 DIAGNOSIS — S301XXA Contusion of abdominal wall, initial encounter: Secondary | ICD-10-CM | POA: Diagnosis present

## 2020-01-09 DIAGNOSIS — S0181XA Laceration without foreign body of other part of head, initial encounter: Secondary | ICD-10-CM | POA: Diagnosis present

## 2020-01-09 LAB — BASIC METABOLIC PANEL
Anion gap: 8 (ref 5–15)
BUN: 12 mg/dL (ref 6–20)
CO2: 23 mmol/L (ref 22–32)
Calcium: 9 mg/dL (ref 8.9–10.3)
Chloride: 108 mmol/L (ref 98–111)
Creatinine, Ser: 1.04 mg/dL (ref 0.61–1.24)
GFR, Estimated: >60 mL/min (ref >60)
Glucose, Bld: 119 mg/dL — ABNORMAL HIGH (ref 70–99)
Potassium: 3.7 mmol/L (ref 3.5–5.1)
Sodium: 139 mmol/L (ref 135–145)

## 2020-01-09 LAB — CBC
HCT: 39.3 % (ref 39.0–52.0)
Hemoglobin: 12.4 g/dL — ABNORMAL LOW (ref 13.0–17.0)
MCH: 28.2 pg (ref 26.0–34.0)
MCHC: 31.6 g/dL (ref 30.0–36.0)
MCV: 89.3 fL (ref 80.0–100.0)
Platelets: 231 10*3/uL (ref 150–400)
RBC: 4.4 MIL/uL (ref 4.22–5.81)
RDW: 15.5 % (ref 11.5–15.5)
WBC: 7.9 10*3/uL (ref 4.0–10.5)
nRBC: 0 % (ref 0.0–0.2)

## 2020-01-09 MED ORDER — POTASSIUM CHLORIDE CRYS ER 10 MEQ PO TBCR
10.0000 meq | EXTENDED_RELEASE_TABLET | Freq: Three times a day (TID) | ORAL | Status: AC
  Administered 2020-01-09 (×3): 10 meq via ORAL
  Filled 2020-01-09 (×3): qty 1
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
## 2020-01-09 NOTE — TOC CAGE-AID Note (Signed)
Transition of Care [facility]) - CAGE-AID Screening   Patient Details  Name: Bill Brooks. MRN: [id number] Date of Birth: 08-26-1960  [facility] of [facility]) CM/SW Contact:    Emeterio Reeve, Anita Phone Number: 01/09/2020, 1:16 PM   Clinical Narrative:  CSW met with pt at bedside. CSW introduced self and explained her role at the hospital.  Pt reports he drinks 1 beer after work daily. Pt denies substance use. Pt did not need resources at this time.   CAGE-AID Screening:    Have You Ever Felt You Ought to Cut Down on Your Drinking or Drug Use?: No Have People Annoyed You By Critizing Your Drinking Or Drug Use?: No Have You Felt Bad Or Guilty About Your Drinking Or Drug Use?: No Have You Ever Had a Drink or Used Drugs First Thing In The Morning to Steady Your Nerves or to Get Rid of a Hangover?: No CAGE-AID Score: 0  Substance Abuse Education Offered: Yes    Blima Ledger, [place] Social Worker [phone]
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
## 2020-01-10 LAB — CBC
HCT: 39.3 % (ref 39.0–52.0)
Hemoglobin: 12.5 g/dL — ABNORMAL LOW (ref 13.0–17.0)
MCH: 28.1 pg (ref 26.0–34.0)
MCHC: 31.8 g/dL (ref 30.0–36.0)
MCV: 88.3 fL (ref 80.0–100.0)
Platelets: 224 10*3/uL (ref 150–400)
RBC: 4.45 MIL/uL (ref 4.22–5.81)
RDW: 15.3 % (ref 11.5–15.5)
WBC: 6.7 10*3/uL (ref 4.0–10.5)
nRBC: 0 % (ref 0.0–0.2)
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
## 2020-01-11 ENCOUNTER — Other Ambulatory Visit (HOSPITAL_COMMUNITY): Payer: Self-pay | Admitting: General Surgery

## 2020-01-11 MED ORDER — DOCUSATE SODIUM 100 MG PO CAPS
100.0000 mg | ORAL_CAPSULE | Freq: Two times a day (BID) | ORAL | 0 refills | Status: DC
Start: 2020-01-11 — End: 2022-03-27

## 2020-01-11 MED ORDER — OXYCODONE HCL 10 MG PO TABS
10.0000 mg | ORAL_TABLET | Freq: Four times a day (QID) | ORAL | 0 refills | Status: AC | PRN
Start: 2020-01-11 — End: 2020-01-18

## 2020-01-11 MED ORDER — ACETAMINOPHEN 500 MG PO TABS
1000.0000 mg | ORAL_TABLET | Freq: Four times a day (QID) | ORAL | 0 refills | Status: DC
Start: 2020-01-11 — End: 2022-03-27

## 2020-01-11 MED ORDER — GUAIFENESIN ER 600 MG PO TB12
600.0000 mg | ORAL_TABLET | Freq: Two times a day (BID) | ORAL | Status: DC
  Administered 2020-01-11: 600 mg via ORAL
  Filled 2020-01-11: qty 1

## 2020-01-11 MED FILL — ACETAMINOPHEN 500MG XT STRE: 500 | 3 days supply | Qty: 30 | Fill #0

## 2020-01-11 MED FILL — oxyCODONE HCL 10 MG TABS: 10 | 7 days supply | Qty: 28 | Fill #0

## 2020-01-11 MED FILL — DOCUSATE SODIUM 100 MG CAPS: 100 | 5 days supply | Qty: 10 | Fill #0

## 2020-01-11 NOTE — Discharge Instructions (Signed)
Rib Fracture ° °A rib fracture is a break or crack in one of the bones of the ribs. The ribs are long, curved bones that wrap around your chest and attach to your spine and your breastbone. The ribs protect your heart, lungs, and other organs in the chest. °A broken or cracked rib is often painful but is not usually serious. Most rib fractures heal on their own over time. However, rib fractures can be more serious if multiple ribs are broken or if broken ribs move out of place and push against other structures or organs. °What are the causes? °This condition is caused by: °· Repetitive movements with high force, such as pitching a baseball or having severe coughing spells. °· A direct blow to the chest, such as a sports injury, a car accident, or a fall. °· Cancer that has spread to the bones, which can weaken bones and cause them to break. °What are the signs or symptoms? °Symptoms of this condition include: °· Pain when you breathe in or cough. °· Pain when someone presses on the injured area. °· Feeling short of breath. °How is this diagnosed? °This condition is diagnosed with a physical exam and medical history. Imaging tests may also be done, such as: °· Chest X-ray. °· CT scan. °· MRI. °· Bone scan. °· Chest ultrasound. °How is this treated? °Treatment for this condition depends on the severity of the fracture. Most rib fractures usually heal on their own in 1-3 months. Sometimes healing takes longer if there is a cough that does not stop or if there are other activities that make the injury worse (aggravating factors). While you heal, you will be given medicines to control the pain. You will also be taught deep breathing exercises. °Severe injuries may require hospitalization or surgery. °Follow these instructions at home: °Managing pain, stiffness, and swelling °· If directed, apply ice to the injured area. °? Put ice in a plastic bag. °? Place a towel between your skin and the bag. °? Leave the ice on for  20 minutes, 2-3 times a day. °· Take over-the-counter and prescription medicines only as told by your health care provider. °Activity °· Avoid a lot of activity and any activities or movements that cause pain. Be careful during activities and avoid bumping the injured rib. °· Slowly increase your activity as told by your health care provider. °General instructions °· Do deep breathing exercises as told by your health care provider. This helps prevent pneumonia, which is a common complication of a broken rib. Your health care provider may instruct you to: °? Take deep breaths several times a day. °? Try to cough several times a day, holding a pillow against the injured area. °? Use a device called incentive spirometer to practice deep breathing several times a day. °· Drink enough fluid to keep your urine pale yellow. °· Do not wear a rib belt or binder. These restrict breathing, which can lead to pneumonia. °· Keep all follow-up visits as told by your health care provider. This is important. °Contact a health care provider if: °· You have a fever. °Get help right away if: °· You have difficulty breathing or you are short of breath. °· You develop a cough that does not stop, or you cough up thick or bloody sputum. °· You have nausea, vomiting, or pain in your abdomen. °· Your pain gets worse and medicine does not help. °Summary °· A rib fracture is a break or crack in one of   the bones of the ribs. °· A broken or cracked rib is often painful but is not usually serious. °· Most rib fractures heal on their own over time. °· Treatment for this condition depends on the severity of the fracture. °· Avoid a lot of activity and any activities or movements that cause pain. °This information is not intended to replace advice given to you by your health care provider. Make sure you discuss any questions you have with your health care provider. °Document Revised: 02/01/2017 Document Reviewed: 05/21/2016 °Elsevier Patient  Education © 2020 [employer] ° °

## 2020-01-11 NOTE — Plan of Care (Signed)
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
## 2020-01-13 ENCOUNTER — Other Ambulatory Visit: Payer: Self-pay

## 2020-01-13 ENCOUNTER — Telehealth: Payer: Self-pay

## 2020-01-13 ENCOUNTER — Ambulatory Visit: Payer: Self-pay | Attending: Physician Assistant | Admitting: Physician Assistant
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
## 2020-01-13 NOTE — Telephone Encounter (Signed)
Patient rescheduled for December 15th with [facility] at 10:30am.

## 2020-01-13 NOTE — Telephone Encounter (Signed)
Could please contact pt sister and see if they still want to do phone visit or wants to reschedule

## 2020-02-17 ENCOUNTER — Telehealth: Payer: Self-pay | Admitting: Physician Assistant

## 2020-07-22 ENCOUNTER — Other Ambulatory Visit (HOSPITAL_COMMUNITY): Payer: Self-pay

## 2020-08-22 ENCOUNTER — Other Ambulatory Visit (HOSPITAL_COMMUNITY): Payer: Self-pay

## 2020-09-09 ENCOUNTER — Other Ambulatory Visit (HOSPITAL_COMMUNITY): Payer: Self-pay

## 2020-09-19 ENCOUNTER — Other Ambulatory Visit (HOSPITAL_COMMUNITY): Payer: Self-pay

## 2021-03-05 DIAGNOSIS — Z5189 Encounter for other specified aftercare: Secondary | ICD-10-CM

## 2021-03-05 HISTORY — DX: Encounter for other specified aftercare: Z51.89

## 2021-11-13 ENCOUNTER — Inpatient Hospital Stay (HOSPITAL_COMMUNITY): Payer: Self-pay

## 2021-11-13 ENCOUNTER — Other Ambulatory Visit: Payer: Self-pay

## 2021-11-13 ENCOUNTER — Emergency Department (HOSPITAL_COMMUNITY): Payer: Self-pay

## 2021-11-13 ENCOUNTER — Inpatient Hospital Stay (HOSPITAL_COMMUNITY): Payer: Self-pay | Admitting: Certified Registered"

## 2021-11-13 ENCOUNTER — Encounter (HOSPITAL_COMMUNITY): Payer: Self-pay | Admitting: Emergency Medicine

## 2021-11-13 ENCOUNTER — Inpatient Hospital Stay (HOSPITAL_COMMUNITY)
Admission: EM | Admit: 2021-11-13 | Discharge: 2022-03-16 | DRG: 003 | Disposition: A | Discharge status: Rehabilitation Facility | Payer: Self-pay | Attending: Surgery | Admitting: Surgery

## 2021-11-13 ENCOUNTER — Encounter (HOSPITAL_COMMUNITY): Admission: EM | Disposition: A | Discharge status: Rehabilitation Facility | Payer: Self-pay | Source: Home / Self Care

## 2021-11-13 DIAGNOSIS — I10 Essential (primary) hypertension: Secondary | ICD-10-CM | POA: Diagnosis present

## 2021-11-13 DIAGNOSIS — J80 Acute respiratory distress syndrome: Secondary | ICD-10-CM | POA: Diagnosis present

## 2021-11-13 DIAGNOSIS — R413 Other amnesia: Secondary | ICD-10-CM | POA: Diagnosis present

## 2021-11-13 DIAGNOSIS — R739 Hyperglycemia, unspecified: Secondary | ICD-10-CM | POA: Diagnosis not present

## 2021-11-13 DIAGNOSIS — R339 Retention of urine, unspecified: Secondary | ICD-10-CM | POA: Diagnosis not present

## 2021-11-13 DIAGNOSIS — R627 Adult failure to thrive: Secondary | ICD-10-CM | POA: Diagnosis not present

## 2021-11-13 DIAGNOSIS — R1310 Dysphagia, unspecified: Secondary | ICD-10-CM | POA: Diagnosis not present

## 2021-11-13 DIAGNOSIS — F17200 Nicotine dependence, unspecified, uncomplicated: Secondary | ICD-10-CM

## 2021-11-13 DIAGNOSIS — S42001A Fracture of unspecified part of right clavicle, initial encounter for closed fracture: Secondary | ICD-10-CM | POA: Diagnosis present

## 2021-11-13 DIAGNOSIS — Z515 Encounter for palliative care: Secondary | ICD-10-CM

## 2021-11-13 DIAGNOSIS — Z781 Physical restraint status: Secondary | ICD-10-CM

## 2021-11-13 DIAGNOSIS — J1569 Pneumonia due to other gram-negative bacteria: Secondary | ICD-10-CM | POA: Diagnosis not present

## 2021-11-13 DIAGNOSIS — Y92410 Unspecified street and highway as the place of occurrence of the external cause: Secondary | ICD-10-CM

## 2021-11-13 DIAGNOSIS — S36116A Major laceration of liver, initial encounter: Secondary | ICD-10-CM | POA: Diagnosis present

## 2021-11-13 DIAGNOSIS — Z6825 Body mass index (BMI) 25.0-25.9, adult: Secondary | ICD-10-CM

## 2021-11-13 DIAGNOSIS — Z23 Encounter for immunization: Secondary | ICD-10-CM

## 2021-11-13 DIAGNOSIS — L89812 Pressure ulcer of head, stage 2: Secondary | ICD-10-CM | POA: Diagnosis not present

## 2021-11-13 DIAGNOSIS — F17210 Nicotine dependence, cigarettes, uncomplicated: Secondary | ICD-10-CM | POA: Diagnosis present

## 2021-11-13 DIAGNOSIS — S42301A Unspecified fracture of shaft of humerus, right arm, initial encounter for closed fracture: Secondary | ICD-10-CM

## 2021-11-13 DIAGNOSIS — R531 Weakness: Secondary | ICD-10-CM | POA: Diagnosis not present

## 2021-11-13 DIAGNOSIS — E162 Hypoglycemia, unspecified: Secondary | ICD-10-CM | POA: Diagnosis not present

## 2021-11-13 DIAGNOSIS — L8990 Pressure ulcer of unspecified site, unspecified stage: Secondary | ICD-10-CM | POA: Insufficient documentation

## 2021-11-13 DIAGNOSIS — E559 Vitamin D deficiency, unspecified: Secondary | ICD-10-CM | POA: Diagnosis present

## 2021-11-13 DIAGNOSIS — S270XXA Traumatic pneumothorax, initial encounter: Secondary | ICD-10-CM

## 2021-11-13 DIAGNOSIS — J9600 Acute respiratory failure, unspecified whether with hypoxia or hypercapnia: Secondary | ICD-10-CM | POA: Insufficient documentation

## 2021-11-13 DIAGNOSIS — T794XXA Traumatic shock, initial encounter: Secondary | ICD-10-CM

## 2021-11-13 DIAGNOSIS — F1210 Cannabis abuse, uncomplicated: Secondary | ICD-10-CM | POA: Diagnosis present

## 2021-11-13 DIAGNOSIS — J930 Spontaneous tension pneumothorax: Secondary | ICD-10-CM | POA: Diagnosis present

## 2021-11-13 DIAGNOSIS — R578 Other shock: Secondary | ICD-10-CM | POA: Diagnosis present

## 2021-11-13 DIAGNOSIS — Z20822 Contact with and (suspected) exposure to covid-19: Secondary | ICD-10-CM | POA: Diagnosis present

## 2021-11-13 DIAGNOSIS — J150 Pneumonia due to Klebsiella pneumoniae: Secondary | ICD-10-CM | POA: Diagnosis not present

## 2021-11-13 DIAGNOSIS — B965 Pseudomonas (aeruginosa) (mallei) (pseudomallei) as the cause of diseases classified elsewhere: Secondary | ICD-10-CM | POA: Diagnosis present

## 2021-11-13 DIAGNOSIS — Z751 Person awaiting admission to adequate facility elsewhere: Secondary | ICD-10-CM

## 2021-11-13 DIAGNOSIS — S37031A Laceration of right kidney, unspecified degree, initial encounter: Secondary | ICD-10-CM

## 2021-11-13 DIAGNOSIS — R402142 Coma scale, eyes open, spontaneous, at arrival to emergency department: Secondary | ICD-10-CM | POA: Diagnosis present

## 2021-11-13 DIAGNOSIS — R64 Cachexia: Secondary | ICD-10-CM | POA: Diagnosis not present

## 2021-11-13 DIAGNOSIS — J439 Emphysema, unspecified: Secondary | ICD-10-CM | POA: Diagnosis present

## 2021-11-13 DIAGNOSIS — F10129 Alcohol abuse with intoxication, unspecified: Secondary | ICD-10-CM | POA: Diagnosis present

## 2021-11-13 DIAGNOSIS — F05 Delirium due to known physiological condition: Secondary | ICD-10-CM | POA: Diagnosis not present

## 2021-11-13 DIAGNOSIS — B961 Klebsiella pneumoniae [K. pneumoniae] as the cause of diseases classified elsewhere: Secondary | ICD-10-CM | POA: Diagnosis present

## 2021-11-13 DIAGNOSIS — D62 Acute posthemorrhagic anemia: Secondary | ICD-10-CM | POA: Diagnosis not present

## 2021-11-13 DIAGNOSIS — J942 Hemothorax: Secondary | ICD-10-CM

## 2021-11-13 DIAGNOSIS — S2249XA Multiple fractures of ribs, unspecified side, initial encounter for closed fracture: Secondary | ICD-10-CM | POA: Diagnosis present

## 2021-11-13 DIAGNOSIS — Z833 Family history of diabetes mellitus: Secondary | ICD-10-CM

## 2021-11-13 DIAGNOSIS — N179 Acute kidney failure, unspecified: Secondary | ICD-10-CM | POA: Diagnosis present

## 2021-11-13 DIAGNOSIS — M549 Dorsalgia, unspecified: Secondary | ICD-10-CM | POA: Diagnosis not present

## 2021-11-13 DIAGNOSIS — G629 Polyneuropathy, unspecified: Secondary | ICD-10-CM | POA: Diagnosis present

## 2021-11-13 DIAGNOSIS — Z79899 Other long term (current) drug therapy: Secondary | ICD-10-CM

## 2021-11-13 DIAGNOSIS — E43 Unspecified severe protein-calorie malnutrition: Secondary | ICD-10-CM | POA: Diagnosis not present

## 2021-11-13 DIAGNOSIS — R402252 Coma scale, best verbal response, oriented, at arrival to emergency department: Secondary | ICD-10-CM | POA: Diagnosis present

## 2021-11-13 DIAGNOSIS — R569 Unspecified convulsions: Secondary | ICD-10-CM | POA: Diagnosis not present

## 2021-11-13 DIAGNOSIS — S37001A Unspecified injury of right kidney, initial encounter: Secondary | ICD-10-CM

## 2021-11-13 DIAGNOSIS — K9423 Gastrostomy malfunction: Secondary | ICD-10-CM | POA: Diagnosis not present

## 2021-11-13 DIAGNOSIS — R000 Tachycardia, unspecified: Secondary | ICD-10-CM | POA: Diagnosis present

## 2021-11-13 DIAGNOSIS — S42112A Displaced fracture of body of scapula, left shoulder, initial encounter for closed fracture: Secondary | ICD-10-CM | POA: Diagnosis present

## 2021-11-13 DIAGNOSIS — R451 Restlessness and agitation: Secondary | ICD-10-CM | POA: Diagnosis not present

## 2021-11-13 DIAGNOSIS — S2241XA Multiple fractures of ribs, right side, initial encounter for closed fracture: Secondary | ICD-10-CM

## 2021-11-13 DIAGNOSIS — I952 Hypotension due to drugs: Secondary | ICD-10-CM

## 2021-11-13 DIAGNOSIS — F1410 Cocaine abuse, uncomplicated: Secondary | ICD-10-CM | POA: Diagnosis present

## 2021-11-13 DIAGNOSIS — S2243XA Multiple fractures of ribs, bilateral, initial encounter for closed fracture: Secondary | ICD-10-CM | POA: Diagnosis present

## 2021-11-13 DIAGNOSIS — S37061A Major laceration of right kidney, initial encounter: Secondary | ICD-10-CM | POA: Diagnosis present

## 2021-11-13 DIAGNOSIS — R001 Bradycardia, unspecified: Secondary | ICD-10-CM | POA: Diagnosis not present

## 2021-11-13 DIAGNOSIS — S36119A Unspecified injury of liver, initial encounter: Secondary | ICD-10-CM

## 2021-11-13 DIAGNOSIS — R402362 Coma scale, best motor response, obeys commands, at arrival to emergency department: Secondary | ICD-10-CM | POA: Diagnosis present

## 2021-11-13 DIAGNOSIS — S065XAA Traumatic subdural hemorrhage with loss of consciousness status unknown, initial encounter: Secondary | ICD-10-CM | POA: Diagnosis present

## 2021-11-13 DIAGNOSIS — S0101XA Laceration without foreign body of scalp, initial encounter: Secondary | ICD-10-CM | POA: Diagnosis present

## 2021-11-13 DIAGNOSIS — S42122A Displaced fracture of acromial process, left shoulder, initial encounter for closed fracture: Secondary | ICD-10-CM | POA: Diagnosis present

## 2021-11-13 DIAGNOSIS — M25532 Pain in left wrist: Secondary | ICD-10-CM | POA: Diagnosis not present

## 2021-11-13 HISTORY — PX: IR EMBO ART  VEN HEMORR LYMPH EXTRAV  INC GUIDE ROADMAPPING: IMG5450

## 2021-11-13 HISTORY — PX: IR US GUIDE VASC ACCESS RIGHT: IMG2390

## 2021-11-13 HISTORY — PX: IR ANGIOGRAM VISCERAL SELECTIVE: IMG657

## 2021-11-13 HISTORY — PX: RADIOLOGY WITH ANESTHESIA: SHX6223

## 2021-11-13 LAB — BASIC METABOLIC PANEL
Anion gap: 6 (ref 5–15)
BUN: 11 mg/dL (ref 8–23)
CO2: 18 mmol/L — ABNORMAL LOW (ref 22–32)
Calcium: 6.4 mg/dL — CL (ref 8.9–10.3)
Chloride: 120 mmol/L — ABNORMAL HIGH (ref 98–111)
Creatinine, Ser: 1.12 mg/dL (ref 0.61–1.24)
GFR, Estimated: >60 mL/min (ref >60)
Glucose, Bld: 143 mg/dL — ABNORMAL HIGH (ref 70–99)
Potassium: 2.9 mmol/L — ABNORMAL LOW (ref 3.5–5.1)
Sodium: 144 mmol/L (ref 135–145)

## 2021-11-13 LAB — I-STAT CHEM 8, ED
BUN: 17 mg/dL (ref 8–23)
Calcium, Ion: 1.08 mmol/L — ABNORMAL LOW (ref 1.15–1.40)
Chloride: 114 mmol/L — ABNORMAL HIGH (ref 98–111)
Creatinine, Ser: 1.5 mg/dL — ABNORMAL HIGH (ref 0.61–1.24)
Glucose, Bld: 172 mg/dL — ABNORMAL HIGH (ref 70–99)
HCT: 42 % (ref 39.0–52.0)
Hemoglobin: 14.3 g/dL (ref 13.0–17.0)
Potassium: 6 mmol/L — ABNORMAL HIGH (ref 3.5–5.1)
Sodium: 144 mmol/L (ref 135–145)
TCO2: 21 mmol/L — ABNORMAL LOW (ref 22–32)

## 2021-11-13 LAB — CBC
HCT: 41.6 % (ref 39.0–52.0)
HCT: 31.7 % — ABNORMAL LOW (ref 39.0–52.0)
Hemoglobin: 13.1 g/dL (ref 13.0–17.0)
Hemoglobin: 10.6 g/dL — ABNORMAL LOW (ref 13.0–17.0)
MCH: 28.2 pg (ref 26.0–34.0)
MCH: 28.9 pg (ref 26.0–34.0)
MCHC: 31.5 g/dL (ref 30.0–36.0)
MCHC: 33.4 g/dL (ref 30.0–36.0)
MCV: 89.5 fL (ref 80.0–100.0)
MCV: 86.4 fL (ref 80.0–100.0)
Platelets: 246 10*3/uL (ref 150–400)
Platelets: 185 10*3/uL (ref 150–400)
RBC: 4.65 MIL/uL (ref 4.22–5.81)
RBC: 3.67 MIL/uL — ABNORMAL LOW (ref 4.22–5.81)
RDW: 15.1 % (ref 11.5–15.5)
RDW: 14.8 % (ref 11.5–15.5)
WBC: 9 10*3/uL (ref 4.0–10.5)
WBC: 11.9 10*3/uL — ABNORMAL HIGH (ref 4.0–10.5)
nRBC: 0 % (ref 0.0–0.2)
nRBC: 0 % (ref 0.0–0.2)

## 2021-11-13 LAB — URINALYSIS, ROUTINE W REFLEX MICROSCOPIC

## 2021-11-13 LAB — POCT I-STAT 7, (LYTES, BLD GAS, ICA,H+H)
Acid-base deficit: 4 mmol/L — ABNORMAL HIGH (ref 0.0–2.0)
Acid-base deficit: 4 mmol/L — ABNORMAL HIGH (ref 0.0–2.0)
Bicarbonate: 23.1 mmol/L (ref 20.0–28.0)
Bicarbonate: 20.7 mmol/L (ref 20.0–28.0)
Calcium, Ion: 1.19 mmol/L (ref 1.15–1.40)
Calcium, Ion: 1.13 mmol/L — ABNORMAL LOW (ref 1.15–1.40)
HCT: 34 % — ABNORMAL LOW (ref 39.0–52.0)
HCT: 33 % — ABNORMAL LOW (ref 39.0–52.0)
Hemoglobin: 11.6 g/dL — ABNORMAL LOW (ref 13.0–17.0)
Hemoglobin: 11.2 g/dL — ABNORMAL LOW (ref 13.0–17.0)
O2 Saturation: 100 %
O2 Saturation: 99 %
Patient temperature: 98.4
Potassium: 3.9 mmol/L (ref 3.5–5.1)
Potassium: 3.9 mmol/L (ref 3.5–5.1)
Sodium: 142 mmol/L (ref 135–145)
Sodium: 142 mmol/L (ref 135–145)
TCO2: 25 mmol/L (ref 22–32)
TCO2: 22 mmol/L (ref 22–32)
pCO2 arterial: 48.7 mmHg — ABNORMAL HIGH (ref 32–48)
pCO2 arterial: 36 mmHg (ref 32–48)
pH, Arterial: 7.284 — ABNORMAL LOW (ref 7.35–7.45)
pH, Arterial: 7.366 (ref 7.35–7.45)
pO2, Arterial: 238 mmHg — ABNORMAL HIGH (ref 83–108)
pO2, Arterial: 129 mmHg — ABNORMAL HIGH (ref 83–108)

## 2021-11-13 LAB — COMPREHENSIVE METABOLIC PANEL
ALT: 154 U/L — ABNORMAL HIGH (ref 0–44)
AST: 282 U/L — ABNORMAL HIGH (ref 15–41)
Albumin: 3.5 g/dL (ref 3.5–5.0)
Alkaline Phosphatase: 56 U/L (ref 38–126)
Anion gap: 5 (ref 5–15)
BUN: 13 mg/dL (ref 8–23)
CO2: 22 mmol/L (ref 22–32)
Calcium: 8.1 mg/dL — ABNORMAL LOW (ref 8.9–10.3)
Chloride: 114 mmol/L — ABNORMAL HIGH (ref 98–111)
Creatinine, Ser: 1.36 mg/dL — ABNORMAL HIGH (ref 0.61–1.24)
GFR, Estimated: 59 mL/min — ABNORMAL LOW (ref >60)
Glucose, Bld: 136 mg/dL — ABNORMAL HIGH (ref 70–99)
Potassium: 3.8 mmol/L (ref 3.5–5.1)
Sodium: 141 mmol/L (ref 135–145)
Total Bilirubin: 0.8 mg/dL (ref 0.3–1.2)
Total Protein: 6.2 g/dL — ABNORMAL LOW (ref 6.5–8.1)

## 2021-11-13 LAB — URINALYSIS, MICROSCOPIC (REFLEX): RBC / HPF: >50 RBC/hpf (ref 0–5)

## 2021-11-13 LAB — HEPATITIS PANEL, ACUTE
HCV Ab: NONREACTIVE
Hep A IgM: NONREACTIVE
Hep B C IgM: NONREACTIVE
Hepatitis B Surface Ag: NONREACTIVE

## 2021-11-13 LAB — MRSA NEXT GEN BY PCR, NASAL: MRSA by PCR Next Gen: NOT DETECTED

## 2021-11-13 LAB — BLOOD PRODUCT ORDER (VERBAL) VERIFICATION

## 2021-11-13 LAB — LACTIC ACID, PLASMA: Lactic Acid, Venous: 4.8 mmol/L (ref 0.5–1.9)

## 2021-11-13 LAB — RESP PANEL BY RT-PCR (FLU A&B, COVID) ARPGX2
Influenza A by PCR: NEGATIVE
Influenza B by PCR: NEGATIVE
SARS Coronavirus 2 by RT PCR: NEGATIVE

## 2021-11-13 LAB — ETHANOL: Alcohol, Ethyl (B): <10 mg/dL (ref <10)

## 2021-11-13 LAB — PROTIME-INR
INR: 1 (ref 0.8–1.2)
Prothrombin Time: 12.8 seconds (ref 11.4–15.2)

## 2021-11-13 LAB — HIV ANTIBODY (ROUTINE TESTING W REFLEX): HIV Screen 4th Generation wRfx: NONREACTIVE

## 2021-11-13 SURGERY — IR WITH ANESTHESIA
Anesthesia: General

## 2021-11-13 MED ORDER — IPRATROPIUM-ALBUTEROL 0.5-2.5 (3) MG/3ML IN SOLN
3.0000 mL | Freq: Four times a day (QID) | RESPIRATORY_TRACT | Status: DC
  Administered 2021-11-13 – 2021-11-28 (×60): 3 mL via RESPIRATORY_TRACT
  Filled 2021-11-13 (×60): qty 3

## 2021-11-13 MED ORDER — CEFAZOLIN SODIUM-DEXTROSE 2-4 GM/100ML-% IV SOLN
2.0000 g | Freq: Once | INTRAVENOUS | Status: AC
  Administered 2021-11-13: 2 g via INTRAVENOUS

## 2021-11-13 MED ORDER — FENTANYL BOLUS VIA INFUSION
50.0000 ug | INTRAVENOUS | Status: DC | PRN
  Administered 2021-11-16 – 2021-11-19 (×4): 50 ug via INTRAVENOUS
  Administered 2021-11-22 – 2021-11-23 (×2): 100 ug via INTRAVENOUS
  Administered 2021-11-23 – 2021-11-24 (×2): 50 ug via INTRAVENOUS
  Administered 2021-11-25 (×3): 100 ug via INTRAVENOUS
  Administered 2021-11-27 – 2021-11-30 (×7): 50 ug via INTRAVENOUS
  Administered 2021-11-30: 100 ug via INTRAVENOUS
  Administered 2021-12-03 – 2021-12-07 (×9): 50 ug via INTRAVENOUS

## 2021-11-13 MED ORDER — ALBUMIN HUMAN 5 % IV SOLN: INTRAVENOUS | Status: DC | PRN

## 2021-11-13 MED ORDER — LACTATED RINGERS IV SOLN: INTRAVENOUS | Status: DC | PRN

## 2021-11-13 MED ORDER — PROPOFOL 500 MG/50ML IV EMUL
INTRAVENOUS | Status: DC | PRN
  Administered 2021-11-13: 75 ug/kg/min via INTRAVENOUS

## 2021-11-13 MED ORDER — CHLORHEXIDINE GLUCONATE CLOTH 2 % EX PADS
6.0000 | MEDICATED_PAD | Freq: Every day | CUTANEOUS | Status: DC
  Administered 2021-11-14 – 2022-01-25 (×66): 6 via TOPICAL

## 2021-11-13 MED ORDER — FENTANYL CITRATE PF 50 MCG/ML IJ SOSY
PREFILLED_SYRINGE | INTRAMUSCULAR | Status: AC
  Administered 2021-11-13: 100 ug
  Filled 2021-11-13: qty 1

## 2021-11-13 MED ORDER — SODIUM CHLORIDE 0.9 % IV SOLN: INTRAVENOUS | Status: DC

## 2021-11-13 MED ORDER — METHOCARBAMOL 1000 MG/10ML IJ SOLN
1000.0000 mg | Freq: Three times a day (TID) | INTRAVENOUS | Status: DC
  Administered 2021-11-14 – 2021-11-16 (×6): 1000 mg via INTRAVENOUS
  Filled 2021-11-13 (×2): qty 1000
  Filled 2021-11-13: qty 10
  Filled 2021-11-13: qty 1000
  Filled 2021-11-13: qty 10
  Filled 2021-11-13: qty 1000

## 2021-11-13 MED ORDER — LIDOCAINE HCL 1 % IJ SOLN
INTRAMUSCULAR | Status: AC | PRN
  Administered 2021-11-13: 5 mL

## 2021-11-13 MED ORDER — ADULT MULTIVITAMIN W/MINERALS CH
1.0000 | ORAL_TABLET | Freq: Every day | ORAL | Status: DC
  Administered 2021-11-14: 1 via ORAL
  Filled 2021-11-13: qty 1

## 2021-11-13 MED ORDER — GELATIN ABSORBABLE 12-7 MM EX MISC
CUTANEOUS | Status: AC | PRN
  Administered 2021-11-13: 1

## 2021-11-13 MED ORDER — LIDOCAINE HCL (PF) 1 % IJ SOLN
INTRAMUSCULAR | Status: AC
  Filled 2021-11-13: qty 5

## 2021-11-13 MED ORDER — HYDROMORPHONE HCL 1 MG/ML IJ SOLN
1.0000 mg | INTRAMUSCULAR | Status: DC | PRN
  Filled 2021-11-13: qty 1

## 2021-11-13 MED ORDER — FENTANYL CITRATE PF 50 MCG/ML IJ SOSY
50.0000 ug | PREFILLED_SYRINGE | Freq: Once | INTRAMUSCULAR | Status: AC
  Administered 2021-11-13: 50 ug via INTRAVENOUS
  Filled 2021-11-13: qty 1

## 2021-11-13 MED ORDER — ACETAMINOPHEN 325 MG PO TABS: 650.0000 mg | ORAL_TABLET | ORAL | Status: DC | PRN

## 2021-11-13 MED ORDER — IOHEXOL 300 MG/ML  SOLN
100.0000 mL | Freq: Once | INTRAMUSCULAR | Status: AC | PRN
  Administered 2021-11-13: 35 mL via INTRA_ARTERIAL

## 2021-11-13 MED ORDER — GELATIN ABSORBABLE 12-7 MM EX MISC
CUTANEOUS | Status: AC
  Filled 2021-11-13: qty 2

## 2021-11-13 MED ORDER — DEXMEDETOMIDINE HCL IN NACL 400 MCG/100ML IV SOLN
0.4000 ug/kg/h | INTRAVENOUS | Status: DC
  Administered 2021-11-13: 0.4 ug/kg/h via INTRAVENOUS
  Administered 2021-11-14: 0.9 ug/kg/h via INTRAVENOUS
  Administered 2021-11-14: 1.2 ug/kg/h via INTRAVENOUS
  Administered 2021-11-15: 0.7 ug/kg/h via INTRAVENOUS
  Administered 2021-11-16: 1.2 ug/kg/h via INTRAVENOUS
  Filled 2021-11-13 (×6): qty 100

## 2021-11-13 MED ORDER — DEXAMETHASONE SODIUM PHOSPHATE 10 MG/ML IJ SOLN
INTRAMUSCULAR | Status: DC | PRN
  Administered 2021-11-13: 10 mg via INTRAVENOUS

## 2021-11-13 MED ORDER — PHENYLEPHRINE 80 MCG/ML (10ML) SYRINGE FOR IV PUSH (FOR BLOOD PRESSURE SUPPORT)
PREFILLED_SYRINGE | INTRAVENOUS | Status: DC | PRN
  Administered 2021-11-13 (×3): 240 ug via INTRAVENOUS

## 2021-11-13 MED ORDER — PHENYLEPHRINE HCL-NACL 20-0.9 MG/250ML-% IV SOLN
INTRAVENOUS | Status: DC | PRN
  Administered 2021-11-13: 80 ug/min via INTRAVENOUS

## 2021-11-13 MED ORDER — ONDANSETRON 4 MG PO TBDP: 4.0000 mg | ORAL_TABLET | Freq: Four times a day (QID) | ORAL | Status: DC | PRN

## 2021-11-13 MED ORDER — PROPOFOL 10 MG/ML IV BOLUS
INTRAVENOUS | Status: DC | PRN
  Administered 2021-11-13: 120 mg via INTRAVENOUS

## 2021-11-13 MED ORDER — LIDOCAINE HCL 1 % IJ SOLN
INTRAMUSCULAR | Status: AC
  Filled 2021-11-13: qty 20

## 2021-11-13 MED ORDER — THIAMINE MONONITRATE 100 MG PO TABS
100.0000 mg | ORAL_TABLET | Freq: Every day | ORAL | Status: DC
  Filled 2021-11-13: qty 1

## 2021-11-13 MED ORDER — FENTANYL CITRATE (PF) 250 MCG/5ML IJ SOLN
INTRAMUSCULAR | Status: DC | PRN
  Administered 2021-11-13 (×2): 50 ug via INTRAVENOUS

## 2021-11-13 MED ORDER — TETANUS-DIPHTH-ACELL PERTUSSIS 5-2.5-18.5 LF-MCG/0.5 IM SUSY
0.5000 mL | PREFILLED_SYRINGE | Freq: Once | INTRAMUSCULAR | Status: AC
  Administered 2021-11-13: 0.5 mL via INTRAMUSCULAR

## 2021-11-13 MED ORDER — THIAMINE HCL 100 MG/ML IJ SOLN
100.0000 mg | Freq: Every day | INTRAMUSCULAR | Status: DC
  Administered 2021-11-14 – 2021-11-15 (×2): 100 mg via INTRAVENOUS
  Filled 2021-11-13 (×2): qty 2

## 2021-11-13 MED ORDER — FENTANYL 2500MCG IN NS 250ML (10MCG/ML) PREMIX INFUSION
0.0000 ug/h | INTRAVENOUS | Status: DC
  Administered 2021-11-13: 50 ug/h via INTRAVENOUS
  Administered 2021-11-14: 110 ug/h via INTRAVENOUS
  Administered 2021-11-15: 100 ug/h via INTRAVENOUS
  Administered 2021-11-15: 110 ug/h via INTRAVENOUS
  Administered 2021-11-16 – 2021-11-17 (×2): 150 ug/h via INTRAVENOUS
  Administered 2021-11-18 (×2): 200 ug/h via INTRAVENOUS
  Administered 2021-11-19: 100 ug/h via INTRAVENOUS
  Administered 2021-11-20 – 2021-11-21 (×3): 150 ug/h via INTRAVENOUS
  Administered 2021-11-22 – 2021-11-23 (×3): 100 ug/h via INTRAVENOUS
  Administered 2021-11-24 – 2021-11-25 (×2): 150 ug/h via INTRAVENOUS
  Administered 2021-11-25: 100 ug/h via INTRAVENOUS
  Administered 2021-11-26: 150 ug/h via INTRAVENOUS
  Administered 2021-11-26: 200 ug/h via INTRAVENOUS
  Administered 2021-11-27: 250 ug/h via INTRAVENOUS
  Administered 2021-11-27: 175 ug/h via INTRAVENOUS
  Administered 2021-11-28: 200 ug/h via INTRAVENOUS
  Administered 2021-11-28 – 2021-11-30 (×4): 150 ug/h via INTRAVENOUS
  Administered 2021-12-01 (×2): 200 ug/h via INTRAVENOUS
  Administered 2021-12-01: 100 ug/h via INTRAVENOUS
  Administered 2021-12-02: 200 ug/h via INTRAVENOUS
  Administered 2021-12-03: 100 ug/h via INTRAVENOUS
  Administered 2021-12-04: 125 ug/h via INTRAVENOUS
  Administered 2021-12-05 – 2021-12-06 (×2): 100 ug/h via INTRAVENOUS
  Administered 2021-12-06: 200 ug/h via INTRAVENOUS
  Filled 2021-11-13 (×35): qty 250

## 2021-11-13 MED ORDER — OXYCODONE HCL 5 MG PO TABS: 5.0000 mg | ORAL_TABLET | ORAL | Status: DC | PRN

## 2021-11-13 MED ORDER — LIDOCAINE 2% (20 MG/ML) 5 ML SYRINGE
INTRAMUSCULAR | Status: DC | PRN
  Administered 2021-11-13: 80 mg via INTRAVENOUS

## 2021-11-13 MED ORDER — FENTANYL CITRATE PF 50 MCG/ML IJ SOSY
PREFILLED_SYRINGE | INTRAMUSCULAR | Status: AC
  Administered 2021-11-13: 50 ug
  Filled 2021-11-13: qty 2

## 2021-11-13 MED ORDER — PANTOPRAZOLE SODIUM 40 MG IV SOLR
40.0000 mg | Freq: Every day | INTRAVENOUS | Status: DC
  Administered 2021-11-13 – 2021-11-15 (×2): 40 mg via INTRAVENOUS
  Filled 2021-11-13 (×2): qty 10

## 2021-11-13 MED ORDER — FOLIC ACID 1 MG PO TABS
1.0000 mg | ORAL_TABLET | Freq: Every day | ORAL | Status: DC
  Administered 2021-11-14: 1 mg via ORAL
  Filled 2021-11-13: qty 1

## 2021-11-13 MED ORDER — SUCCINYLCHOLINE CHLORIDE 200 MG/10ML IV SOSY
PREFILLED_SYRINGE | INTRAVENOUS | Status: DC | PRN
  Administered 2021-11-13: 100 mg via INTRAVENOUS

## 2021-11-13 MED ORDER — IOHEXOL 350 MG/ML SOLN
75.0000 mL | Freq: Once | INTRAVENOUS | Status: AC | PRN
  Administered 2021-11-13: 75 mL via INTRAVENOUS

## 2021-11-13 MED ORDER — ROCURONIUM BROMIDE 10 MG/ML (PF) SYRINGE
PREFILLED_SYRINGE | INTRAVENOUS | Status: DC | PRN
  Administered 2021-11-13 (×2): 50 mg via INTRAVENOUS

## 2021-11-13 MED ORDER — LORAZEPAM 2 MG/ML IJ SOLN
1.0000 mg | INTRAMUSCULAR | Status: DC | PRN
  Administered 2021-11-16: 4 mg via INTRAVENOUS
  Administered 2021-11-16: 2 mg via INTRAVENOUS
  Filled 2021-11-13 (×3): qty 1

## 2021-11-13 MED ORDER — LORAZEPAM 1 MG PO TABS: 1.0000 mg | ORAL_TABLET | ORAL | Status: DC | PRN

## 2021-11-13 MED ORDER — OXYCODONE HCL 5 MG PO TABS
10.0000 mg | ORAL_TABLET | ORAL | Status: DC | PRN
  Filled 2021-11-13: qty 2

## 2021-11-13 MED ORDER — IOHEXOL 300 MG/ML  SOLN
100.0000 mL | Freq: Once | INTRAMUSCULAR | Status: AC | PRN
  Administered 2021-11-13: 80 mL via INTRA_ARTERIAL

## 2021-11-13 MED ORDER — ONDANSETRON HCL 4 MG/2ML IJ SOLN
4.0000 mg | Freq: Four times a day (QID) | INTRAMUSCULAR | Status: DC | PRN
  Administered 2021-11-18 – 2021-12-15 (×19): 4 mg via INTRAVENOUS
  Filled 2021-11-13 (×21): qty 2

## 2021-11-13 MED ORDER — PANTOPRAZOLE SODIUM 40 MG PO TBEC
40.0000 mg | DELAYED_RELEASE_TABLET | Freq: Every day | ORAL | Status: DC
  Filled 2021-11-13: qty 1

## 2021-11-13 MED ORDER — FENTANYL CITRATE (PF) 100 MCG/2ML IJ SOLN
INTRAMUSCULAR | Status: AC
  Filled 2021-11-13: qty 2

## 2021-11-13 MED ORDER — ONDANSETRON HCL 4 MG/2ML IJ SOLN
INTRAMUSCULAR | Status: DC | PRN
  Administered 2021-11-13: 4 mg via INTRAVENOUS

## 2021-11-13 NOTE — Consult Note (Signed)
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

## 2021-11-13 NOTE — Progress Notes (Signed)
Orthopaedic Trauma Service   Consult received from Trauma service R clavicle fracture and L scapula fracture  R arm pain with pending xrays of humerus   Full consult to follow  Pt to IR then ICU    Mearl Latin, PA-C [phone] (C) 11/13/2021, 5:21 PM  [facility] [address] [place] [address] [phone] Collier Bullock (F)      Patient ID: Uno Esau., male   DOB: 1960/10/10, 61 y.o.   MRN: [id number]
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
## 2021-11-13 NOTE — Procedures (Signed)
Laceration Repair  Date/Time: 11/13/2021 5:32 PM  Performed by: Violeta Gelinas, MD Authorized by: Violeta Gelinas, MD   Consent:    Consent obtained:  Verbal   Consent given by:  Patient Universal protocol:    Procedure explained and questions answered to patient or proxy's satisfaction: yes     Relevant documents present and verified: yes     Test results available: yes     Imaging studies available: yes     Required blood products, implants, devices, and special equipment available: yes     Site/side marked: yes     Immediately prior to procedure, a time out was called: yes     Patient identity confirmed:  Verbally with patient Anesthesia:    Anesthesia method:  Local infiltration Laceration details:    Location:  Scalp Pre-procedure details:    Preparation:  Patient was prepped and draped in usual sterile fashion Treatment:    Area cleansed with:  Povidone-iodine   Irrigation solution:  Sterile water   Debridement:  None Skin repair:    Repair method:  Staples Repair type:    Repair type:  Simple Post-procedure details:    Procedure completion:  Tolerated Comments:     6cm laceration, simple closure Violeta Gelinas, MD, MPH, FACS Please use [email or web address] to contact on call provider

## 2021-11-13 NOTE — ED Notes (Signed)
Upgrade to level 1 by pickering
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
## 2021-11-13 NOTE — Plan of Care (Signed)
  Problem: Clinical Measurements: Goal: Ability to maintain clinical measurements within normal limits will improve Outcome: Progressing Goal: Respiratory complications will improve Outcome: Progressing Goal: Cardiovascular complication will be avoided Outcome: Progressing   Problem: Activity: Goal: Risk for activity intolerance will decrease Outcome: Progressing   Problem: Pain Managment: Goal: General experience of comfort will improve Outcome: Progressing   Problem: Safety: Goal: Ability to remain free from injury will improve Outcome: Progressing   Problem: Skin Integrity: Goal: Risk for impaired skin integrity will decrease Outcome: Progressing   
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
## 2021-11-13 NOTE — ED Notes (Signed)
X-ray at bedside
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
## 2021-11-13 NOTE — Procedures (Signed)
Chest tube insertion  Date/Time: 11/13/2021 4:44 PM  Performed by: Violeta Gelinas, MD Authorized by: Violeta Gelinas, MD   Consent:    Consent obtained:  Emergent situation Pre-procedure details:    Skin preparation:  ChloraPrep Anesthesia (see MAR for exact dosages):    Anesthesia method:  Local infiltration Procedure details:    Placement location:  R lateral   Tube size (Fr):  20   Technique: blunt     Ultrasound guidance: no     Tension pneumothorax: yes     Tube connected to:  Suction Comments:     Release of tension with large blood and air evacuated  Violeta Gelinas, MD, MPH, FACS Please use [email or web address] to contact on call provider
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
## 2021-11-13 NOTE — Transfer of Care (Signed)
Immediate Anesthesia Transfer of Care Note  Patient: Bill Brooks.  Procedure(s) Performed: IR WITH ANESTHESIA  Patient Location: ICU  Anesthesia Type:General  Level of Consciousness: Patient remains intubated per anesthesia plan  Airway & Oxygen Therapy: Patient remains intubated per anesthesia plan and Patient placed on Ventilator (see vital sign flow sheet for setting)  Post-op Assessment: Report given to RN and Post -op Vital signs reviewed and stable  Post vital signs: Reviewed and stable  Last Vitals:  Vitals Value Taken Time  BP 140/84 11/13/21 2002  Temp    Pulse 87 11/13/21 2008  Resp 18 11/13/21 2012  SpO2 100 % 11/13/21 2008  Vitals shown include unvalidated device data.  Last Pain:  Vitals:   11/13/21 1542  TempSrc:   PainSc: 10-Worst pain ever         Complications: No notable events documented.
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
## 2021-11-13 NOTE — Anesthesia Postprocedure Evaluation (Signed)
Anesthesia Post Note  Patient: Bill Brooks.  Procedure(s) Performed: IR WITH ANESTHESIA     Patient location during evaluation: ICU Anesthesia Type: General Level of consciousness: patient remains intubated per anesthesia plan Pain management: satisfactory to patient Vital Signs Assessment: post-procedure vital signs reviewed and stable Respiratory status: patient remains intubated per anesthesia plan Cardiovascular status: stable Postop Assessment: no apparent nausea or vomiting Anesthetic complications: no   No notable events documented.  Last Vitals:  Vitals:   11/13/21 1744 11/13/21 2053  BP:    Pulse: (!) 109   Resp: (!) 23   Temp: 36.8 C   SpO2: 90% 100%    Last Pain:  Vitals:   11/13/21 1542  TempSrc:   PainSc: 10-Worst pain ever                 [name]

## 2021-11-13 NOTE — Progress Notes (Signed)
Orthopedic Tech Progress Note Patient Details:  Bill Brooks. 07/22/1960 [id number]  Ortho Devices Type of Ortho Device: Arm sling, Coapt Ortho Device/Splint Location: rue Ortho Device/Splint Interventions: Ordered, Application, Adjustment   Post Interventions Patient Tolerated: Well Instructions Provided: Care of device, Adjustment of device  Trinna Post 11/13/2021, 11:28 PM

## 2021-11-13 NOTE — Anesthesia Procedure Notes (Signed)
Arterial Line Insertion Start/End9/01/2022 6:00 PM Performed by: Sheppard Evens, CRNA  Patient location: OOR procedure area. Preanesthetic checklist: patient identified, IV checked, site marked, risks and benefits discussed, surgical consent, monitors and equipment checked, pre-op evaluation, timeout performed and anesthesia consent Left, radial was placed Catheter size: 20 G Hand hygiene performed  and maximum sterile barriers used   Attempts: 1 Procedure performed without using ultrasound guided technique. Following insertion, dressing applied and Biopatch. Post procedure assessment: normal
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
## 2021-11-13 NOTE — Anesthesia Procedure Notes (Signed)
Procedure Name: Intubation Date/Time: 11/13/2021 6:05 PM  Performed by: Gwenyth Allegra, CRNAPre-anesthesia Checklist: Patient identified, Emergency Drugs available, Suction available, Patient being monitored and Timeout performed Patient Re-evaluated:Patient Re-evaluated prior to induction Oxygen Delivery Method: Circle system utilized Preoxygenation: Pre-oxygenation with 100% oxygen Induction Type: IV induction, Rapid sequence and Cricoid Pressure applied Grade View: Grade II Tube type: Oral Tube size: 7.5 mm Number of attempts: 1 Airway Equipment and Method: Stylet Placement Confirmation: positive ETCO2, breath sounds checked- equal and bilateral and ETT inserted through vocal cords under direct vision Secured at: 22 cm Dental Injury: Teeth and Oropharynx as per pre-operative assessment
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
## 2021-11-13 NOTE — Progress Notes (Signed)
ABG results given to RN. 
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
## 2021-11-14 ENCOUNTER — Inpatient Hospital Stay (HOSPITAL_COMMUNITY): Payer: Self-pay

## 2021-11-14 ENCOUNTER — Encounter (HOSPITAL_COMMUNITY): Payer: Self-pay | Admitting: Interventional Radiology

## 2021-11-14 LAB — TYPE AND SCREEN
ABO/RH(D): O POS
Antibody Screen: NEGATIVE
Unit division: 0

## 2021-11-14 LAB — CBC
HCT: 28.6 % — ABNORMAL LOW (ref 39.0–52.0)
HCT: 26 % — ABNORMAL LOW (ref 39.0–52.0)
HCT: 26.9 % — ABNORMAL LOW (ref 39.0–52.0)
HCT: 26.3 % — ABNORMAL LOW (ref 39.0–52.0)
Hemoglobin: 9.3 g/dL — ABNORMAL LOW (ref 13.0–17.0)
Hemoglobin: 8.7 g/dL — ABNORMAL LOW (ref 13.0–17.0)
Hemoglobin: 8.6 g/dL — ABNORMAL LOW (ref 13.0–17.0)
Hemoglobin: 8.5 g/dL — ABNORMAL LOW (ref 13.0–17.0)
MCH: 28.3 pg (ref 26.0–34.0)
MCH: 28.4 pg (ref 26.0–34.0)
MCH: 28.4 pg (ref 26.0–34.0)
MCH: 28.7 pg (ref 26.0–34.0)
MCHC: 32.5 g/dL (ref 30.0–36.0)
MCHC: 33.5 g/dL (ref 30.0–36.0)
MCHC: 32 g/dL (ref 30.0–36.0)
MCHC: 32.3 g/dL (ref 30.0–36.0)
MCV: 86.9 fL (ref 80.0–100.0)
MCV: 85 fL (ref 80.0–100.0)
MCV: 88.8 fL (ref 80.0–100.0)
MCV: 88.9 fL (ref 80.0–100.0)
Platelets: 160 10*3/uL (ref 150–400)
Platelets: 153 10*3/uL (ref 150–400)
Platelets: 152 10*3/uL (ref 150–400)
Platelets: 141 10*3/uL — ABNORMAL LOW (ref 150–400)
RBC: 3.29 MIL/uL — ABNORMAL LOW (ref 4.22–5.81)
RBC: 3.06 MIL/uL — ABNORMAL LOW (ref 4.22–5.81)
RBC: 3.03 MIL/uL — ABNORMAL LOW (ref 4.22–5.81)
RBC: 2.96 MIL/uL — ABNORMAL LOW (ref 4.22–5.81)
RDW: 15.1 % (ref 11.5–15.5)
RDW: 15.4 % (ref 11.5–15.5)
RDW: 15.5 % (ref 11.5–15.5)
RDW: 15.5 % (ref 11.5–15.5)
WBC: 10.5 10*3/uL (ref 4.0–10.5)
WBC: 9.5 10*3/uL (ref 4.0–10.5)
WBC: 10.2 10*3/uL (ref 4.0–10.5)
WBC: 10.1 10*3/uL (ref 4.0–10.5)
nRBC: 0 % (ref 0.0–0.2)
nRBC: 0 % (ref 0.0–0.2)
nRBC: 0 % (ref 0.0–0.2)
nRBC: 0 % (ref 0.0–0.2)

## 2021-11-14 LAB — PREPARE FRESH FROZEN PLASMA: Unit division: 0

## 2021-11-14 LAB — BASIC METABOLIC PANEL
Anion gap: 5 (ref 5–15)
BUN: 16 mg/dL (ref 8–23)
CO2: 21 mmol/L — ABNORMAL LOW (ref 22–32)
Calcium: 7.8 mg/dL — ABNORMAL LOW (ref 8.9–10.3)
Chloride: 115 mmol/L — ABNORMAL HIGH (ref 98–111)
Creatinine, Ser: 1.64 mg/dL — ABNORMAL HIGH (ref 0.61–1.24)
GFR, Estimated: 47 mL/min — ABNORMAL LOW (ref >60)
Glucose, Bld: 140 mg/dL — ABNORMAL HIGH (ref 70–99)
Potassium: 3.9 mmol/L (ref 3.5–5.1)
Sodium: 141 mmol/L (ref 135–145)

## 2021-11-14 LAB — BPAM FFP
Blood Product Expiration Date: 202309152359
ISSUE DATE / TIME: 202309111551
Unit Type and Rh: 6200

## 2021-11-14 LAB — BPAM RBC
Blood Product Expiration Date: 202310172359
ISSUE DATE / TIME: 202309111537
Unit Type and Rh: 5100

## 2021-11-14 LAB — ABO/RH: ABO/RH(D): O POS

## 2021-11-14 MED ORDER — PANTOPRAZOLE 2 MG/ML SUSPENSION: 40.0000 mg | Freq: Every day | ORAL | Status: DC

## 2021-11-14 MED ORDER — ORAL CARE MOUTH RINSE: 15.0000 mL | OROMUCOSAL | Status: DC | PRN

## 2021-11-14 MED ORDER — ACETAMINOPHEN 325 MG PO TABS
650.0000 mg | ORAL_TABLET | ORAL | Status: DC | PRN
  Administered 2021-11-15 – 2021-11-18 (×11): 650 mg
  Filled 2021-11-14 (×11): qty 2

## 2021-11-14 MED ORDER — CHLORHEXIDINE GLUCONATE 0.12 % MT SOLN
15.0000 mL | Freq: Once | OROMUCOSAL | Status: AC
  Administered 2021-11-14: 15 mL via OROMUCOSAL
  Filled 2021-11-14: qty 15

## 2021-11-14 MED ORDER — ADULT MULTIVITAMIN W/MINERALS CH
1.0000 | ORAL_TABLET | Freq: Every day | ORAL | Status: DC
  Administered 2021-11-15 – 2021-12-14 (×26): 1
  Filled 2021-11-14 (×27): qty 1

## 2021-11-14 MED ORDER — ORAL CARE MOUTH RINSE
15.0000 mL | OROMUCOSAL | Status: DC
  Administered 2021-11-14 – 2021-12-09 (×294): 15 mL via OROMUCOSAL

## 2021-11-14 MED ORDER — OXYCODONE HCL 5 MG PO TABS
10.0000 mg | ORAL_TABLET | ORAL | Status: DC | PRN
  Administered 2021-11-14 – 2021-11-17 (×9): 10 mg
  Filled 2021-11-14 (×8): qty 2

## 2021-11-14 MED ORDER — OXYCODONE HCL 5 MG PO TABS: 5.0000 mg | ORAL_TABLET | ORAL | Status: DC | PRN

## 2021-11-14 MED ORDER — FOLIC ACID 1 MG PO TABS
1.0000 mg | ORAL_TABLET | Freq: Every day | ORAL | Status: DC
  Administered 2021-11-15 – 2021-12-14 (×26): 1 mg
  Filled 2021-11-14 (×27): qty 1

## 2021-11-14 MED ORDER — ALBUMIN HUMAN 5 % IV SOLN
25.0000 g | Freq: Once | INTRAVENOUS | Status: AC
  Administered 2021-11-14: 25 g via INTRAVENOUS
  Filled 2021-11-14: qty 500

## 2021-11-14 MED ORDER — CEFAZOLIN SODIUM-DEXTROSE 2-4 GM/100ML-% IV SOLN
2.0000 g | Freq: Once | INTRAVENOUS | Status: AC
  Administered 2021-11-16: 2 g via INTRAVENOUS
  Filled 2021-11-14: qty 100

## 2021-11-14 MED ORDER — ALBUMIN HUMAN 5 % IV SOLN
INTRAVENOUS | Status: AC
  Filled 2021-11-14: qty 250
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
## 2021-11-14 NOTE — Progress Notes (Signed)
RT transported pt to and from CT without complications ? ?
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
## 2021-11-15 ENCOUNTER — Inpatient Hospital Stay (HOSPITAL_COMMUNITY): Payer: Self-pay

## 2021-11-15 LAB — PHOSPHORUS
Phosphorus: 2.8 mg/dL (ref 2.5–4.6)
Phosphorus: 2.8 mg/dL (ref 2.5–4.6)

## 2021-11-15 LAB — POCT I-STAT 7, (LYTES, BLD GAS, ICA,H+H)
Acid-base deficit: 7 mmol/L — ABNORMAL HIGH (ref 0.0–2.0)
Bicarbonate: 18.6 mmol/L — ABNORMAL LOW (ref 20.0–28.0)
Calcium, Ion: 1.2 mmol/L (ref 1.15–1.40)
HCT: 28 % — ABNORMAL LOW (ref 39.0–52.0)
Hemoglobin: 9.5 g/dL — ABNORMAL LOW (ref 13.0–17.0)
O2 Saturation: 82 %
Patient temperature: 100.6
Potassium: 4.1 mmol/L (ref 3.5–5.1)
Sodium: 141 mmol/L (ref 135–145)
TCO2: 20 mmol/L — ABNORMAL LOW (ref 22–32)
pCO2 arterial: 38.6 mmHg (ref 32–48)
pH, Arterial: 7.297 — ABNORMAL LOW (ref 7.35–7.45)
pO2, Arterial: 54 mmHg — ABNORMAL LOW (ref 83–108)

## 2021-11-15 LAB — MAGNESIUM
Magnesium: 2.1 mg/dL (ref 1.7–2.4)
Magnesium: 2.2 mg/dL (ref 1.7–2.4)

## 2021-11-15 LAB — BASIC METABOLIC PANEL
Anion gap: 6 (ref 5–15)
BUN: 16 mg/dL (ref 8–23)
CO2: 19 mmol/L — ABNORMAL LOW (ref 22–32)
Calcium: 6.9 mg/dL — ABNORMAL LOW (ref 8.9–10.3)
Chloride: 116 mmol/L — ABNORMAL HIGH (ref 98–111)
Creatinine, Ser: 1.3 mg/dL — ABNORMAL HIGH (ref 0.61–1.24)
GFR, Estimated: >60 mL/min (ref >60)
Glucose, Bld: 96 mg/dL (ref 70–99)
Potassium: 3.3 mmol/L — ABNORMAL LOW (ref 3.5–5.1)
Sodium: 141 mmol/L (ref 135–145)

## 2021-11-15 LAB — CBC
HCT: 24.3 % — ABNORMAL LOW (ref 39.0–52.0)
Hemoglobin: 7.9 g/dL — ABNORMAL LOW (ref 13.0–17.0)
MCH: 28.5 pg (ref 26.0–34.0)
MCHC: 32.5 g/dL (ref 30.0–36.0)
MCV: 87.7 fL (ref 80.0–100.0)
Platelets: 132 10*3/uL — ABNORMAL LOW (ref 150–400)
RBC: 2.77 MIL/uL — ABNORMAL LOW (ref 4.22–5.81)
RDW: 15.6 % — ABNORMAL HIGH (ref 11.5–15.5)
WBC: 9.4 10*3/uL (ref 4.0–10.5)
nRBC: 0 % (ref 0.0–0.2)

## 2021-11-15 LAB — SURGICAL PCR SCREEN
MRSA, PCR: NEGATIVE
Staphylococcus aureus: NEGATIVE

## 2021-11-15 LAB — GLUCOSE, CAPILLARY: Glucose-Capillary: 99 mg/dL (ref 70–99)

## 2021-11-15 MED ORDER — POTASSIUM CHLORIDE 20 MEQ PO PACK
20.0000 meq | PACK | Freq: Once | ORAL | Status: AC
  Administered 2021-11-15: 20 meq
  Filled 2021-11-15: qty 1

## 2021-11-15 MED ORDER — VITAL 1.5 CAL PO LIQD
1000.0000 mL | ORAL | Status: DC
  Administered 2021-11-15 – 2021-11-18 (×4): 1000 mL

## 2021-11-15 MED ORDER — PROSOURCE TF20 ENFIT COMPATIBL EN LIQD
60.0000 mL | Freq: Two times a day (BID) | ENTERAL | Status: DC
  Administered 2021-11-15 – 2021-11-16 (×4): 60 mL
  Filled 2021-11-15 (×4): qty 60

## 2021-11-15 MED ORDER — THIAMINE HCL 100 MG/ML IJ SOLN
100.0000 mg | Freq: Every day | INTRAMUSCULAR | Status: DC
  Administered 2021-11-19: 100 mg via INTRAVENOUS
  Filled 2021-11-15 (×2): qty 2

## 2021-11-15 MED ORDER — THIAMINE MONONITRATE 100 MG PO TABS
100.0000 mg | ORAL_TABLET | Freq: Every day | ORAL | Status: DC
  Administered 2021-11-16 – 2021-12-14 (×25): 100 mg
  Filled 2021-11-15 (×26): qty 1

## 2021-11-15 MED ORDER — MAGNESIUM CITRATE PO SOLN
1.0000 | Freq: Once | ORAL | Status: AC
  Administered 2021-11-15: 1
  Filled 2021-11-15: qty 296

## 2021-11-15 MED ORDER — PANTOPRAZOLE 2 MG/ML SUSPENSION
40.0000 mg | Freq: Every day | ORAL | Status: DC
  Administered 2021-11-16 – 2021-11-18 (×3): 40 mg
  Filled 2021-11-15 (×3): qty 20

## 2021-11-15 MED ORDER — ALBUMIN HUMAN 5 % IV SOLN
25.0000 g | Freq: Once | INTRAVENOUS | Status: AC
  Administered 2021-11-15: 25 g via INTRAVENOUS
  Filled 2021-11-15: qty 500

## 2021-11-15 NOTE — Progress Notes (Signed)
ETT found @ 23cm and was advanced 5cm per MD order. ETT is now secured at 28@lip 

## 2021-11-15 NOTE — Progress Notes (Signed)
1720 Ice applied for temp 38.5 despite tylenol given 1500
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
## 2021-11-15 NOTE — Progress Notes (Signed)
Patient ID: Bill Brooks., male   DOB: [birth year], 61 y.o.   MRN: [id number] Patient remains sedated and intubated however will arouse and follow commands neurologically stable
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
## 2021-11-16 ENCOUNTER — Inpatient Hospital Stay (HOSPITAL_COMMUNITY): Payer: Self-pay

## 2021-11-16 ENCOUNTER — Inpatient Hospital Stay: Payer: Self-pay

## 2021-11-16 DIAGNOSIS — R569 Unspecified convulsions: Secondary | ICD-10-CM

## 2021-11-16 LAB — POCT I-STAT 7, (LYTES, BLD GAS, ICA,H+H)
Acid-base deficit: 7 mmol/L — ABNORMAL HIGH (ref 0.0–2.0)
Acid-base deficit: 7 mmol/L — ABNORMAL HIGH (ref 0.0–2.0)
Acid-base deficit: 7 mmol/L — ABNORMAL HIGH (ref 0.0–2.0)
Acid-base deficit: 9 mmol/L — ABNORMAL HIGH (ref 0.0–2.0)
Bicarbonate: 18.7 mmol/L — ABNORMAL LOW (ref 20.0–28.0)
Bicarbonate: 18.8 mmol/L — ABNORMAL LOW (ref 20.0–28.0)
Bicarbonate: 18.9 mmol/L — ABNORMAL LOW (ref 20.0–28.0)
Bicarbonate: 19 mmol/L — ABNORMAL LOW (ref 20.0–28.0)
Calcium, Ion: 1.21 mmol/L (ref 1.15–1.40)
Calcium, Ion: 1.21 mmol/L (ref 1.15–1.40)
Calcium, Ion: 1.23 mmol/L (ref 1.15–1.40)
Calcium, Ion: 1.24 mmol/L (ref 1.15–1.40)
HCT: 23 % — ABNORMAL LOW (ref 39.0–52.0)
HCT: 23 % — ABNORMAL LOW (ref 39.0–52.0)
HCT: 26 % — ABNORMAL LOW (ref 39.0–52.0)
HCT: 27 % — ABNORMAL LOW (ref 39.0–52.0)
Hemoglobin: 7.8 g/dL — ABNORMAL LOW (ref 13.0–17.0)
Hemoglobin: 7.8 g/dL — ABNORMAL LOW (ref 13.0–17.0)
Hemoglobin: 8.8 g/dL — ABNORMAL LOW (ref 13.0–17.0)
Hemoglobin: 9.2 g/dL — ABNORMAL LOW (ref 13.0–17.0)
O2 Saturation: 70 %
O2 Saturation: 82 %
O2 Saturation: 98 %
O2 Saturation: 98 %
Patient temperature: 36.8
Patient temperature: 38.4
Patient temperature: 38.8
Patient temperature: 98.6
Potassium: 4.4 mmol/L (ref 3.5–5.1)
Potassium: 4.4 mmol/L (ref 3.5–5.1)
Potassium: 4.5 mmol/L (ref 3.5–5.1)
Potassium: 4.7 mmol/L (ref 3.5–5.1)
Sodium: 140 mmol/L (ref 135–145)
Sodium: 140 mmol/L (ref 135–145)
Sodium: 140 mmol/L (ref 135–145)
Sodium: 142 mmol/L (ref 135–145)
TCO2: 20 mmol/L — ABNORMAL LOW (ref 22–32)
TCO2: 20 mmol/L — ABNORMAL LOW (ref 22–32)
TCO2: 20 mmol/L — ABNORMAL LOW (ref 22–32)
TCO2: 20 mmol/L — ABNORMAL LOW (ref 22–32)
pCO2 arterial: 38.8 mmHg (ref 32–48)
pCO2 arterial: 41.7 mmHg (ref 32–48)
pCO2 arterial: 43.6 mmHg (ref 32–48)
pCO2 arterial: 47 mmHg (ref 32–48)
pH, Arterial: 7.207 — ABNORMAL LOW (ref 7.35–7.45)
pH, Arterial: 7.251 — ABNORMAL LOW (ref 7.35–7.45)
pH, Arterial: 7.271 — ABNORMAL LOW (ref 7.35–7.45)
pH, Arterial: 7.297 — ABNORMAL LOW (ref 7.35–7.45)
pO2, Arterial: 108 mmHg (ref 83–108)
pO2, Arterial: 128 mmHg — ABNORMAL HIGH (ref 83–108)
pO2, Arterial: 44 mmHg — ABNORMAL LOW (ref 83–108)
pO2, Arterial: 59 mmHg — ABNORMAL LOW (ref 83–108)

## 2021-11-16 LAB — HEPATIC FUNCTION PANEL
ALT: 83 U/L — ABNORMAL HIGH (ref 0–44)
AST: 96 U/L — ABNORMAL HIGH (ref 15–41)
Albumin: 2.6 g/dL — ABNORMAL LOW (ref 3.5–5.0)
Alkaline Phosphatase: 48 U/L (ref 38–126)
Bilirubin, Direct: 0.1 mg/dL (ref 0.0–0.2)
Indirect Bilirubin: 0.6 mg/dL (ref 0.3–0.9)
Total Bilirubin: 0.7 mg/dL (ref 0.3–1.2)
Total Protein: 5.9 g/dL — ABNORMAL LOW (ref 6.5–8.1)

## 2021-11-16 LAB — BASIC METABOLIC PANEL
Anion gap: 7 (ref 5–15)
BUN: 23 mg/dL (ref 8–23)
CO2: 19 mmol/L — ABNORMAL LOW (ref 22–32)
Calcium: 7.9 mg/dL — ABNORMAL LOW (ref 8.9–10.3)
Chloride: 115 mmol/L — ABNORMAL HIGH (ref 98–111)
Creatinine, Ser: 1.52 mg/dL — ABNORMAL HIGH (ref 0.61–1.24)
GFR, Estimated: 51 mL/min — ABNORMAL LOW (ref >60)
Glucose, Bld: 118 mg/dL — ABNORMAL HIGH (ref 70–99)
Potassium: 4.3 mmol/L (ref 3.5–5.1)
Sodium: 141 mmol/L (ref 135–145)

## 2021-11-16 LAB — CBC
HCT: 24.6 % — ABNORMAL LOW (ref 39.0–52.0)
Hemoglobin: 7.8 g/dL — ABNORMAL LOW (ref 13.0–17.0)
MCH: 28.7 pg (ref 26.0–34.0)
MCHC: 31.7 g/dL (ref 30.0–36.0)
MCV: 90.4 fL (ref 80.0–100.0)
Platelets: 142 10*3/uL — ABNORMAL LOW (ref 150–400)
RBC: 2.72 MIL/uL — ABNORMAL LOW (ref 4.22–5.81)
RDW: 15.7 % — ABNORMAL HIGH (ref 11.5–15.5)
WBC: 8.2 10*3/uL (ref 4.0–10.5)
nRBC: 0 % (ref 0.0–0.2)

## 2021-11-16 LAB — GLUCOSE, CAPILLARY
Glucose-Capillary: 102 mg/dL — ABNORMAL HIGH (ref 70–99)
Glucose-Capillary: 110 mg/dL — ABNORMAL HIGH (ref 70–99)
Glucose-Capillary: 118 mg/dL — ABNORMAL HIGH (ref 70–99)
Glucose-Capillary: 118 mg/dL — ABNORMAL HIGH (ref 70–99)
Glucose-Capillary: 123 mg/dL — ABNORMAL HIGH (ref 70–99)
Glucose-Capillary: 123 mg/dL — ABNORMAL HIGH (ref 70–99)
Glucose-Capillary: 139 mg/dL — ABNORMAL HIGH (ref 70–99)
Glucose-Capillary: <10 mg/dL — CL (ref 70–99)

## 2021-11-16 LAB — MAGNESIUM
Magnesium: 2.4 mg/dL (ref 1.7–2.4)
Magnesium: 2.9 mg/dL — ABNORMAL HIGH (ref 1.7–2.4)

## 2021-11-16 LAB — PHOSPHORUS
Phosphorus: 2.7 mg/dL (ref 2.5–4.6)
Phosphorus: 2.3 mg/dL — ABNORMAL LOW (ref 2.5–4.6)

## 2021-11-16 LAB — LACTIC ACID, PLASMA: Lactic Acid, Venous: 0.9 mmol/L (ref 0.5–1.9)

## 2021-11-16 LAB — VITAMIN D 25 HYDROXY (VIT D DEFICIENCY, FRACTURES): Vit D, 25-Hydroxy: 14.74 ng/mL — ABNORMAL LOW (ref 30–100)

## 2021-11-16 MED ORDER — SODIUM CHLORIDE 0.9 % IV SOLN
2.0000 g | Freq: Three times a day (TID) | INTRAVENOUS | Status: DC
  Administered 2021-11-16: 2 g via INTRAVENOUS
  Filled 2021-11-16: qty 12.5

## 2021-11-16 MED ORDER — METHOCARBAMOL 1000 MG/10ML IJ SOLN
1000.0000 mg | Freq: Three times a day (TID) | INTRAVENOUS | Status: DC
  Administered 2021-11-16 – 2021-11-17 (×4): 1000 mg via INTRAVENOUS
  Filled 2021-11-16 (×4): qty 1000

## 2021-11-16 MED ORDER — BISACODYL 10 MG RE SUPP
10.0000 mg | Freq: Once | RECTAL | Status: AC
  Administered 2021-11-16: 10 mg via RECTAL
  Filled 2021-11-16: qty 1

## 2021-11-16 MED ORDER — NOREPINEPHRINE 4 MG/250ML-% IV SOLN
2.0000 ug/min | INTRAVENOUS | Status: DC
  Administered 2021-11-16: 10 ug/min via INTRAVENOUS

## 2021-11-16 MED ORDER — NOREPINEPHRINE 4 MG/250ML-% IV SOLN: 0.0000 ug/min | INTRAVENOUS | Status: DC

## 2021-11-16 MED ORDER — MIDAZOLAM-SODIUM CHLORIDE 100-0.9 MG/100ML-% IV SOLN: 0.5000 mg/h | INTRAVENOUS | Status: DC

## 2021-11-16 MED ORDER — ATROPINE SULFATE 1 MG/10ML IJ SOSY
PREFILLED_SYRINGE | INTRAMUSCULAR | Status: AC
  Filled 2021-11-16: qty 10

## 2021-11-16 MED ORDER — MIDAZOLAM HCL 2 MG/2ML IJ SOLN
4.0000 mg | Freq: Once | INTRAMUSCULAR | Status: AC
  Administered 2021-11-16: 4 mg via INTRAVENOUS

## 2021-11-16 MED ORDER — ENOXAPARIN SODIUM 30 MG/0.3ML IJ SOSY
30.0000 mg | PREFILLED_SYRINGE | Freq: Two times a day (BID) | INTRAMUSCULAR | Status: DC
  Administered 2021-11-16 – 2022-03-16 (×237): 30 mg via SUBCUTANEOUS
  Filled 2021-11-16 (×238): qty 0.3

## 2021-11-16 MED ORDER — NOREPINEPHRINE 4 MG/250ML-% IV SOLN
INTRAVENOUS | Status: AC
  Filled 2021-11-16: qty 250

## 2021-11-16 MED ORDER — MIDAZOLAM BOLUS VIA INFUSION
0.0000 mg | INTRAVENOUS | Status: DC | PRN
  Administered 2021-11-16 – 2021-11-17 (×2): 2 mg via INTRAVENOUS
  Administered 2021-11-17 (×2): 1 mg via INTRAVENOUS
  Administered 2021-11-17 – 2021-11-24 (×5): 2 mg via INTRAVENOUS

## 2021-11-16 MED ORDER — PROPOFOL 1000 MG/100ML IV EMUL
INTRAVENOUS | Status: AC
  Filled 2021-11-16: qty 100

## 2021-11-16 MED ORDER — LORAZEPAM 2 MG/ML IJ SOLN
INTRAMUSCULAR | Status: AC
  Filled 2021-11-16: qty 1

## 2021-11-16 MED ORDER — LEVETIRACETAM IN NACL 500 MG/100ML IV SOLN
500.0000 mg | Freq: Two times a day (BID) | INTRAVENOUS | Status: DC
  Administered 2021-11-16 – 2021-11-27 (×22): 500 mg via INTRAVENOUS
  Filled 2021-11-16 (×22): qty 100

## 2021-11-16 MED ORDER — SODIUM CHLORIDE 0.9 % IV SOLN
2.0000 g | Freq: Two times a day (BID) | INTRAVENOUS | Status: AC
  Administered 2021-11-16 – 2021-11-23 (×14): 2 g via INTRAVENOUS
  Filled 2021-11-16 (×12): qty 12.5

## 2021-11-16 MED ORDER — MIDAZOLAM HCL 2 MG/2ML IJ SOLN
INTRAMUSCULAR | Status: AC
  Filled 2021-11-16: qty 4

## 2021-11-16 MED ORDER — MIDAZOLAM-SODIUM CHLORIDE 100-0.9 MG/100ML-% IV SOLN
0.0000 mg/h | INTRAVENOUS | Status: DC
  Administered 2021-11-16: 2 mg/h via INTRAVENOUS
  Administered 2021-11-17 – 2021-11-20 (×3): 3 mg/h via INTRAVENOUS
  Administered 2021-11-21: 2 mg/h via INTRAVENOUS
  Administered 2021-11-23: 3 mg/h via INTRAVENOUS
  Filled 2021-11-16 (×6): qty 100

## 2021-11-16 MED ORDER — NOREPINEPHRINE 16 MG/250ML-% IV SOLN
0.0000 ug/min | INTRAVENOUS | Status: DC
  Administered 2021-11-16: 4 ug/min via INTRAVENOUS
  Filled 2021-11-16: qty 250

## 2021-11-16 MED ORDER — SODIUM CHLORIDE 0.9 % IV SOLN
250.0000 mL | INTRAVENOUS | Status: DC
  Administered 2021-11-18: 250 mL via INTRAVENOUS
  Administered 2021-11-24: 1000 mL via INTRAVENOUS
  Administered 2021-11-29 – 2021-12-09 (×2): 250 mL via INTRAVENOUS

## 2021-11-16 MED ORDER — PROPOFOL 1000 MG/100ML IV EMUL
5.0000 ug/kg/min | INTRAVENOUS | Status: DC
  Administered 2021-11-16: 60 ug/kg/min via INTRAVENOUS
  Administered 2021-11-16: 80 ug/kg/min via INTRAVENOUS
  Administered 2021-11-16: 60 ug/kg/min via INTRAVENOUS
  Filled 2021-11-16 (×3): qty 100

## 2021-11-16 MED ORDER — SENNA 8.6 MG PO TABS
2.0000 | ORAL_TABLET | Freq: Once | ORAL | Status: AC
  Administered 2021-11-16: 17.2 mg
  Filled 2021-11-16: qty 2

## 2021-11-16 MED ORDER — IOHEXOL 350 MG/ML SOLN
100.0000 mL | Freq: Once | INTRAVENOUS | Status: AC | PRN
  Administered 2021-11-16: 80 mL via INTRAVENOUS

## 2021-11-16 NOTE — Progress Notes (Signed)
EEG complete - results pending 

## 2021-11-16 NOTE — Procedures (Signed)
Patient Name: Bill Brooks.  MRN: [id number]  Epilepsy Attending: Charlsie Quest  Referring Physician/Provider: Fritzi Mandes, MD Date: 11/16/2021 Duration: 23.55 mins  Patient history: 61yo M with seizure like activity. EEG to evaluate for seizure  Level of alertness: comatose/sedated  AEDs during EEG study: propofol  Technical aspects: This EEG study was done with scalp electrodes positioned according to the 10-20 International system of electrode placement. Electrical activity was reviewed with band pass filter of 1-70Hz , sensitivity of 7 uV/mm, display speed of 15mm/sec with a 60Hz  notched filter applied as appropriate. EEG data were recorded continuously and digitally stored.  Video monitoring was available and reviewed as appropriate.  Description: EEG showed near continuous generalized 5-7 Hz theta slowing admixed with 1-2 seconds of generalized eeg attenuation. Hyperventilation and photic stimulation were not performed.     ABNORMALITY - Continuous slow, generalized  IMPRESSION: This study is suggestive of severe diffuse encephalopathy, nonspecific etiology but likely related to sedation. No seizures or epileptiform discharges were seen throughout the recording.  Mahati Vajda 

## 2021-11-16 NOTE — Progress Notes (Signed)
PHARMACY NOTE:  ANTIMICROBIAL RENAL DOSAGE ADJUSTMENT  Current antimicrobial regimen includes a mismatch between antimicrobial dosage and estimated renal function.  As per policy approved by the Pharmacy & Therapeutics and Medical Executive Committees, the antimicrobial dosage will be adjusted accordingly.  Current antimicrobial dosage:  Cefepime 2g q8h  Indication: PNA  Renal Function:  Estimated Creatinine Clearance: 55.4 mL/min (A) (by C-G formula based on SCr of 1.52 mg/dL (H)).  Antimicrobial dosage has been changed to:  cefepime 2g q12h  Additional comments:   Thank you for allowing pharmacy to be a part of this patient's care.  Gerrit Halls, PharmD, BCPS Clinical Pharmacist 11/16/2021 8:39 AM
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
## 2021-11-16 NOTE — Procedures (Signed)
Central line  Date/Time: 11/16/2021 4:30 AM  Performed by: Fritzi Mandes, MD Authorized by: Fritzi Mandes, MD   Consent:    Consent obtained:  Emergent situation Pre-procedure details:    Indication(s): central venous access and insufficient peripheral access     Hand hygiene: Hand hygiene performed prior to insertion     Sterile barrier technique: All elements of maximal sterile technique followed     Skin preparation:  Chlorhexidine   Skin preparation agent: Skin preparation agent completely dried prior to procedure   Sedation:    Sedation type:  Deep (Propofol, precedex) Procedure details:    Location:  L internal jugular   Site selection rationale:  Access to RUE limited by injury   Patient position:  Trendelenburg   Procedural supplies:  Triple lumen   Catheter size:  8 Fr   Landmarks identified: yes     Ultrasound guidance: yes     Ultrasound guidance timing: real time     Sterile ultrasound techniques: Sterile gel and sterile probe covers were used     Number of attempts:  1   Successful placement: yes   Post-procedure details:    Post-procedure:  Dressing applied and line sutured   Assessment:  Blood return through all ports, no pneumothorax on x-ray, placement verified by x-ray and free fluid flow   Procedure completion:  Tolerated well, no immediate complications  Patient has required levophed to maintain BP. Central line placed to allow infusion.  Sophronia Simas, MD [facility] Surgery General, Hepatobiliary and Pancreatic Surgery 11/16/21 4:33 AM

## 2021-11-16 NOTE — Progress Notes (Signed)
I was called and notified that patient developed seizure activity around 0200, with subsequent O2 desats to 60%. Patient taken off vent and bag ventilation initiated. 4mg  ativan given per CIWA protocol and propofol started. 4mg  versed given. On my arrival to the bedside O2 sats were in low 80s with bag mask ventilation ongoing. Fasciculations noted in right thigh. Patient has been persistently febrile. Breath sounds coarse bilaterally. R chest tube with no air leak. CXR shows patchy infiltrates bilaterally but no large pneumothorax, no tracheal deviation, and no white-out to suggest mucous plugging. Patient's O2 sats remained low for 20-30 minutes before recovering to about 90%. ABG showed pO2 of 44. Patient was subsequently placed back on the vent.  Patient is now resting comfortably on propofol infusion, with continued precedex and fentanyl. No tremors at this time. Sats have recovered to 97% on 100% FiO2 and 14 PEEP. Suspect he de-recruited during seizure. He is now synchronous with the vent. PE is also a possibility, however with recent SDH there would be risks to anticoagulation. CCM consulted and bedside echo performed, showing possible RV enlargement but overall function appears normal.  Given improvement in oxygenation with adequate sedation, will defer CT PE and CT head at this time, as I am concerned he could de-recruit further if transported to scanner. Will continue current vent support, can wean FiO2 slowly but keep PEEP at 14. Given fevers and infiltrates on CXR, will also send respiratory cultures and begin cefepime empirically. Tentatively plan for head CT in am once patient is more stabilized from a respiratory standpoint.   , MD [facility] Surgery General, Hepatobiliary and Pancreatic Surgery 11/16/21 3:24 AM

## 2021-11-16 NOTE — Progress Notes (Signed)
At 0202, pt's SBP increased to 220's, and pt started seizing. Paged Freida Busman MD. Jovita Gamma PRN Ativan per CIWA protocol.  Pt started to desat down to 65% SpO2. Called respiratory and began bagging the pt. Allen gave verbal order to give 4 mg Versed and start propofol at 100 mcg/kg/min. Pt's O2 sats remained low for 30 minutes. Pt continued to seize until 0240. Pt then became hypotensive with SBP in 80's. Levophed started per MD order.

## 2021-11-16 NOTE — Progress Notes (Signed)
RT transported pt to CT and back to 4N28 on full vent support. No complication noted.

## 2021-11-16 NOTE — Progress Notes (Signed)
LTM hooked up. NON MRI leads used. [facility] monitoring. Test button tested.
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
## 2021-11-17 ENCOUNTER — Inpatient Hospital Stay (HOSPITAL_COMMUNITY): Payer: Self-pay

## 2021-11-17 DIAGNOSIS — R4182 Altered mental status, unspecified: Secondary | ICD-10-CM

## 2021-11-17 LAB — PHOSPHORUS
Phosphorus: 2.9 mg/dL (ref 2.5–4.6)
Phosphorus: 3.2 mg/dL (ref 2.5–4.6)

## 2021-11-17 LAB — CBC
HCT: 22.8 % — ABNORMAL LOW (ref 39.0–52.0)
Hemoglobin: 7.1 g/dL — ABNORMAL LOW (ref 13.0–17.0)
MCH: 28.5 pg (ref 26.0–34.0)
MCHC: 31.1 g/dL (ref 30.0–36.0)
MCV: 91.6 fL (ref 80.0–100.0)
Platelets: 152 10*3/uL (ref 150–400)
RBC: 2.49 MIL/uL — ABNORMAL LOW (ref 4.22–5.81)
RDW: 15.6 % — ABNORMAL HIGH (ref 11.5–15.5)
WBC: 8.1 10*3/uL (ref 4.0–10.5)
nRBC: 0 % (ref 0.0–0.2)

## 2021-11-17 LAB — BASIC METABOLIC PANEL
Anion gap: 8 (ref 5–15)
BUN: 33 mg/dL — ABNORMAL HIGH (ref 8–23)
CO2: 20 mmol/L — ABNORMAL LOW (ref 22–32)
Calcium: 8.4 mg/dL — ABNORMAL LOW (ref 8.9–10.3)
Chloride: 111 mmol/L (ref 98–111)
Creatinine, Ser: 1.5 mg/dL — ABNORMAL HIGH (ref 0.61–1.24)
GFR, Estimated: 52 mL/min — ABNORMAL LOW (ref >60)
Glucose, Bld: 127 mg/dL — ABNORMAL HIGH (ref 70–99)
Potassium: 4.6 mmol/L (ref 3.5–5.1)
Sodium: 139 mmol/L (ref 135–145)

## 2021-11-17 LAB — GLUCOSE, CAPILLARY
Glucose-Capillary: 103 mg/dL — ABNORMAL HIGH (ref 70–99)
Glucose-Capillary: 126 mg/dL — ABNORMAL HIGH (ref 70–99)
Glucose-Capillary: 130 mg/dL — ABNORMAL HIGH (ref 70–99)
Glucose-Capillary: 137 mg/dL — ABNORMAL HIGH (ref 70–99)
Glucose-Capillary: 157 mg/dL — ABNORMAL HIGH (ref 70–99)
Glucose-Capillary: 167 mg/dL — ABNORMAL HIGH (ref 70–99)
Glucose-Capillary: 96 mg/dL (ref 70–99)

## 2021-11-17 LAB — TRIGLYCERIDES: Triglycerides: 134 mg/dL (ref <150)

## 2021-11-17 LAB — MAGNESIUM
Magnesium: 2.9 mg/dL — ABNORMAL HIGH (ref 1.7–2.4)
Magnesium: 2.9 mg/dL — ABNORMAL HIGH (ref 1.7–2.4)

## 2021-11-17 MED ORDER — LACTULOSE 10 GM/15ML PO SOLN
30.0000 g | Freq: Once | ORAL | Status: AC
  Administered 2021-11-17: 30 g
  Filled 2021-11-17: qty 45

## 2021-11-17 MED ORDER — PROSOURCE TF20 ENFIT COMPATIBL EN LIQD
60.0000 mL | Freq: Every day | ENTERAL | Status: DC
  Administered 2021-11-17 – 2021-11-18 (×2): 60 mL
  Filled 2021-11-17 (×2): qty 60

## 2021-11-17 MED ORDER — VITAL HIGH PROTEIN PO LIQD: 1000.0000 mL | ORAL | Status: DC

## 2021-11-17 MED ORDER — METHOCARBAMOL 500 MG PO TABS
1000.0000 mg | ORAL_TABLET | Freq: Three times a day (TID) | ORAL | Status: DC
  Administered 2021-11-17 – 2021-11-18 (×3): 1000 mg
  Filled 2021-11-17 (×3): qty 2

## 2021-11-17 MED ORDER — FUROSEMIDE 10 MG/ML IJ SOLN
40.0000 mg | Freq: Once | INTRAMUSCULAR | Status: AC
  Administered 2021-11-17: 40 mg via INTRAVENOUS
  Filled 2021-11-17: qty 4

## 2021-11-17 MED ORDER — SENNA 8.6 MG PO TABS
2.0000 | ORAL_TABLET | Freq: Once | ORAL | Status: AC
  Administered 2021-11-17: 17.2 mg
  Filled 2021-11-17: qty 2

## 2021-11-17 MED ORDER — METHOCARBAMOL 500 MG PO TABS: 1000.0000 mg | ORAL_TABLET | Freq: Three times a day (TID) | ORAL | Status: DC

## 2021-11-17 MED ORDER — SODIUM PHOSPHATES 45 MMOLE/15ML IV SOLN
15.0000 mmol | Freq: Once | INTRAVENOUS | Status: AC
  Administered 2021-11-17: 15 mmol via INTRAVENOUS
  Filled 2021-11-17: qty 5

## 2021-11-17 MED ORDER — POLYETHYLENE GLYCOL 3350 17 G PO PACK
17.0000 g | PACK | Freq: Two times a day (BID) | ORAL | Status: DC
  Administered 2021-11-17 – 2021-11-24 (×14): 17 g
  Filled 2021-11-17 (×15): qty 1

## 2021-11-17 MED ORDER — BISACODYL 10 MG RE SUPP
10.0000 mg | Freq: Once | RECTAL | Status: AC
  Administered 2021-11-17: 10 mg via RECTAL
  Filled 2021-11-17: qty 1

## 2021-11-17 NOTE — Progress Notes (Signed)
Nutrition Brief Note  Consult received for tube feeding. TF infusing via OG at goal rate- Vital 1.5 at 41ml/hr ( per day). Spoke with RN. Pt tolerating well TF well.   Per MD, escalate bowel regimen today.   Potassium and Phos WNL today. Mg 2.9 (H).    Drusilla Kanner, RDN, LDN Clinical Nutrition

## 2021-11-17 NOTE — Procedures (Signed)
Patient Name: Toluwani Shaddai Shapley.  MRN: [id number]  Epilepsy Attending: Charlsie Quest  Referring Physician/Provider: Diamantina Monks, MD  Duration: 11/16/2021 1003 to 11/17/2021  1003   Patient history: 61yo M with seizure like activity. EEG to evaluate for seizure   Level of alertness: comatose/sedated   AEDs during EEG study: LEV, Versed   Technical aspects: This EEG study was done with scalp electrodes positioned according to the 10-20 International system of electrode placement. Electrical activity was reviewed with band pass filter of 1-70Hz , sensitivity of 7 uV/mm, display speed of 63mm/sec with a 60Hz  notched filter applied as appropriate. EEG data were recorded continuously and digitally stored.  Video monitoring was available and reviewed as appropriate.   Description: EEG showed near continuous generalized 5-8 Hz theta-alpha activity admixed with intermittent generalized 2-3Hz  delta slowing. Hyperventilation and photic stimulation were not performed.      ABNORMALITY - Continuous slow, generalized   IMPRESSION: This study is suggestive of moderate to severe diffuse encephalopathy, nonspecific etiology but likely related to sedation. No seizures or epileptiform discharges were seen throughout the recording.   Vicy Medico 

## 2021-11-17 NOTE — Progress Notes (Signed)
LTM EEG discontinued - Electrode pressure under FP1 and FP2. RN notified.
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
## 2021-11-18 ENCOUNTER — Inpatient Hospital Stay (HOSPITAL_COMMUNITY): Payer: Self-pay

## 2021-11-18 LAB — HEMOGLOBIN A1C
Hgb A1c MFr Bld: 5.6 % (ref 4.8–5.6)
Mean Plasma Glucose: 114.02 mg/dL

## 2021-11-18 LAB — PREPARE RBC (CROSSMATCH)

## 2021-11-18 LAB — BASIC METABOLIC PANEL
Anion gap: 8 (ref 5–15)
BUN: 42 mg/dL — ABNORMAL HIGH (ref 8–23)
CO2: 21 mmol/L — ABNORMAL LOW (ref 22–32)
Calcium: 8.6 mg/dL — ABNORMAL LOW (ref 8.9–10.3)
Chloride: 111 mmol/L (ref 98–111)
Creatinine, Ser: 1.68 mg/dL — ABNORMAL HIGH (ref 0.61–1.24)
GFR, Estimated: 46 mL/min — ABNORMAL LOW (ref >60)
Glucose, Bld: 181 mg/dL — ABNORMAL HIGH (ref 70–99)
Potassium: 4.2 mmol/L (ref 3.5–5.1)
Sodium: 140 mmol/L (ref 135–145)

## 2021-11-18 LAB — CBC
HCT: 21.2 % — ABNORMAL LOW (ref 39.0–52.0)
Hemoglobin: 6.8 g/dL — CL (ref 13.0–17.0)
MCH: 28.6 pg (ref 26.0–34.0)
MCHC: 32.1 g/dL (ref 30.0–36.0)
MCV: 89.1 fL (ref 80.0–100.0)
Platelets: 186 10*3/uL (ref 150–400)
RBC: 2.38 MIL/uL — ABNORMAL LOW (ref 4.22–5.81)
RDW: 15.9 % — ABNORMAL HIGH (ref 11.5–15.5)
WBC: 9.1 10*3/uL (ref 4.0–10.5)
nRBC: 0.2 % (ref 0.0–0.2)

## 2021-11-18 LAB — POCT I-STAT 7, (LYTES, BLD GAS, ICA,H+H)
Acid-base deficit: 4 mmol/L — ABNORMAL HIGH (ref 0.0–2.0)
Bicarbonate: 22.2 mmol/L (ref 20.0–28.0)
Calcium, Ion: 1.29 mmol/L (ref 1.15–1.40)
HCT: 21 % — ABNORMAL LOW (ref 39.0–52.0)
Hemoglobin: 7.1 g/dL — ABNORMAL LOW (ref 13.0–17.0)
O2 Saturation: 98 %
Patient temperature: 100.9
Potassium: 4.2 mmol/L (ref 3.5–5.1)
Sodium: 143 mmol/L (ref 135–145)
TCO2: 24 mmol/L (ref 22–32)
pCO2 arterial: 46.1 mmHg (ref 32–48)
pH, Arterial: 7.298 — ABNORMAL LOW (ref 7.35–7.45)
pO2, Arterial: 131 mmHg — ABNORMAL HIGH (ref 83–108)

## 2021-11-18 LAB — HEMOGLOBIN AND HEMATOCRIT, BLOOD
HCT: 23.6 % — ABNORMAL LOW (ref 39.0–52.0)
Hemoglobin: 7.4 g/dL — ABNORMAL LOW (ref 13.0–17.0)

## 2021-11-18 LAB — GLUCOSE, CAPILLARY
Glucose-Capillary: 101 mg/dL — ABNORMAL HIGH (ref 70–99)
Glucose-Capillary: 104 mg/dL — ABNORMAL HIGH (ref 70–99)
Glucose-Capillary: 106 mg/dL — ABNORMAL HIGH (ref 70–99)
Glucose-Capillary: 158 mg/dL — ABNORMAL HIGH (ref 70–99)
Glucose-Capillary: 172 mg/dL — ABNORMAL HIGH (ref 70–99)
Glucose-Capillary: 179 mg/dL — ABNORMAL HIGH (ref 70–99)

## 2021-11-18 LAB — MAGNESIUM
Magnesium: 2.7 mg/dL — ABNORMAL HIGH (ref 1.7–2.4)
Magnesium: 2.9 mg/dL — ABNORMAL HIGH (ref 1.7–2.4)

## 2021-11-18 LAB — PHOSPHORUS
Phosphorus: 2.3 mg/dL — ABNORMAL LOW (ref 2.5–4.6)
Phosphorus: 2.8 mg/dL (ref 2.5–4.6)

## 2021-11-18 LAB — LACTIC ACID, PLASMA: Lactic Acid, Venous: 0.7 mmol/L (ref 0.5–1.9)

## 2021-11-18 MED ORDER — METHOCARBAMOL 1000 MG/10ML IJ SOLN
1000.0000 mg | Freq: Three times a day (TID) | INTRAVENOUS | Status: DC
  Administered 2021-11-18 – 2021-11-22 (×12): 1000 mg via INTRAVENOUS
  Filled 2021-11-18 (×2): qty 1000
  Filled 2021-11-18 (×2): qty 10
  Filled 2021-11-18: qty 1000
  Filled 2021-11-18: qty 10
  Filled 2021-11-18: qty 1000
  Filled 2021-11-18: qty 10
  Filled 2021-11-18 (×3): qty 1000
  Filled 2021-11-18: qty 10
  Filled 2021-11-18 (×2): qty 1000

## 2021-11-18 MED ORDER — HYDROMORPHONE HCL 1 MG/ML IJ SOLN
1.0000 mg | INTRAMUSCULAR | Status: DC | PRN
  Administered 2021-11-30 – 2021-12-01 (×2): 1 mg via INTRAVENOUS
  Filled 2021-11-18 (×2): qty 1

## 2021-11-18 MED ORDER — PANTOPRAZOLE SODIUM 40 MG IV SOLR
40.0000 mg | INTRAVENOUS | Status: DC
  Administered 2021-11-19 – 2021-11-21 (×2): 40 mg via INTRAVENOUS
  Filled 2021-11-18 (×2): qty 10

## 2021-11-18 MED ORDER — FLEET ENEMA 7-19 GM/118ML RE ENEM
1.0000 | ENEMA | Freq: Once | RECTAL | Status: AC
  Administered 2021-11-18: 1 via RECTAL
  Filled 2021-11-18: qty 1

## 2021-11-18 MED ORDER — SODIUM CHLORIDE 0.9% IV SOLUTION: Freq: Once | INTRAVENOUS | Status: AC

## 2021-11-18 MED ORDER — INSULIN ASPART 100 UNIT/ML IJ SOLN
0.0000 [IU] | INTRAMUSCULAR | Status: DC
  Administered 2021-11-18 – 2021-11-21 (×4): 3 [IU] via SUBCUTANEOUS
  Administered 2021-11-21 – 2021-11-22 (×2): 2 [IU] via SUBCUTANEOUS
  Administered 2021-11-22: 5 [IU] via SUBCUTANEOUS
  Administered 2021-11-22: 2 [IU] via SUBCUTANEOUS
  Administered 2021-11-22: 3 [IU] via SUBCUTANEOUS
  Administered 2021-11-22 – 2021-11-23 (×2): 2 [IU] via SUBCUTANEOUS
  Administered 2021-11-23 (×6): 3 [IU] via SUBCUTANEOUS
  Administered 2021-11-24: 2 [IU] via SUBCUTANEOUS
  Administered 2021-11-24: 3 [IU] via SUBCUTANEOUS
  Administered 2021-11-24: 2 [IU] via SUBCUTANEOUS
  Administered 2021-11-24 (×2): 3 [IU] via SUBCUTANEOUS
  Administered 2021-11-25 (×2): 2 [IU] via SUBCUTANEOUS
  Administered 2021-11-25 (×2): 3 [IU] via SUBCUTANEOUS
  Administered 2021-11-25 – 2021-11-26 (×3): 2 [IU] via SUBCUTANEOUS
  Administered 2021-11-26: 3 [IU] via SUBCUTANEOUS
  Administered 2021-11-26 (×2): 2 [IU] via SUBCUTANEOUS
  Administered 2021-11-27: 3 [IU] via SUBCUTANEOUS
  Administered 2021-11-27 (×2): 2 [IU] via SUBCUTANEOUS
  Administered 2021-11-27 – 2021-11-28 (×2): 3 [IU] via SUBCUTANEOUS
  Administered 2021-11-28 – 2021-11-30 (×9): 2 [IU] via SUBCUTANEOUS
  Administered 2021-11-30: 3 [IU] via SUBCUTANEOUS
  Administered 2021-11-30 (×2): 2 [IU] via SUBCUTANEOUS
  Administered 2021-12-01: 3 [IU] via SUBCUTANEOUS
  Administered 2021-12-01: 2 [IU] via SUBCUTANEOUS
  Administered 2021-12-01: 3 [IU] via SUBCUTANEOUS
  Administered 2021-12-01 – 2021-12-03 (×5): 2 [IU] via SUBCUTANEOUS
  Administered 2021-12-03: 4 [IU] via SUBCUTANEOUS
  Administered 2021-12-03 – 2021-12-30 (×29): 2 [IU] via SUBCUTANEOUS
  Administered 2021-12-30: 3 [IU] via SUBCUTANEOUS
  Administered 2021-12-30 – 2022-01-04 (×11): 2 [IU] via SUBCUTANEOUS
  Administered 2022-01-04: 0 [IU] via SUBCUTANEOUS
  Administered 2022-01-05 – 2022-01-20 (×17): 2 [IU] via SUBCUTANEOUS
  Administered 2022-01-20: 3 [IU] via SUBCUTANEOUS
  Administered 2022-01-21 – 2022-01-23 (×8): 2 [IU] via SUBCUTANEOUS
  Administered 2022-01-23: 3 [IU] via SUBCUTANEOUS
  Administered 2022-01-24 – 2022-02-04 (×20): 2 [IU] via SUBCUTANEOUS
  Administered 2022-02-04: 3 [IU] via SUBCUTANEOUS
  Administered 2022-02-04 – 2022-02-15 (×8): 2 [IU] via SUBCUTANEOUS
  Administered 2022-02-16: 3 [IU] via SUBCUTANEOUS
  Administered 2022-02-19: 2 [IU] via SUBCUTANEOUS
  Administered 2022-02-20: 3 [IU] via SUBCUTANEOUS
  Administered 2022-02-20: 2 [IU] via SUBCUTANEOUS
  Administered 2022-02-20: 3 [IU] via SUBCUTANEOUS
  Administered 2022-02-22: 2 [IU] via SUBCUTANEOUS
  Administered 2022-02-23 (×2): 3 [IU] via SUBCUTANEOUS
  Administered 2022-02-24 (×3): 2 [IU] via SUBCUTANEOUS
  Administered 2022-02-25 (×2): 3 [IU] via SUBCUTANEOUS
  Administered 2022-02-25: 2 [IU] via SUBCUTANEOUS
  Administered 2022-02-26: 3 [IU] via SUBCUTANEOUS
  Administered 2022-02-26 – 2022-02-27 (×2): 2 [IU] via SUBCUTANEOUS
  Administered 2022-02-27: 3 [IU] via SUBCUTANEOUS

## 2021-11-18 MED ORDER — PANTOPRAZOLE SODIUM 40 MG IV SOLR: 40.0000 mg | INTRAVENOUS | Status: DC

## 2021-11-18 MED ORDER — ACETAMINOPHEN 10 MG/ML IV SOLN
1000.0000 mg | Freq: Four times a day (QID) | INTRAVENOUS | Status: AC
  Administered 2021-11-18 – 2021-11-19 (×4): 1000 mg via INTRAVENOUS
  Filled 2021-11-18 (×5): qty 100

## 2021-11-18 MED ORDER — MAGNESIUM HYDROXIDE 400 MG/5ML PO SUSP
30.0000 mL | Freq: Once | ORAL | Status: AC
  Administered 2021-11-18: 30 mL
  Filled 2021-11-18: qty 30

## 2021-11-18 NOTE — Plan of Care (Signed)
  Problem: Elimination: Goal: Will not experience complications related to urinary retention Outcome: Progressing   Problem: Cardiovascular: Goal: Vascular access site(s) Level 0-1 will be maintained Outcome: Progressing   Problem: Metabolic: Goal: Ability to maintain appropriate glucose levels will improve Outcome: Progressing   Problem: Tissue Perfusion: Goal: Adequacy of tissue perfusion will improve Outcome: Progressing
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
## 2021-11-18 NOTE — Procedures (Signed)
Patient Name: Bill Brooks.  MRN: [id number]  Epilepsy Attending: Lora Havens  Referring Physician/Provider: Jesusita Oka, MD  Duration: 11/17/2021 1003 to 11/17/2021  1854   Patient history: 61yo M with seizure like activity. EEG to evaluate for seizure   Level of alertness: comatose/sedated   AEDs during EEG study: LEV, Versed   Technical aspects: This EEG study was done with scalp electrodes positioned according to the 10-20 International system of electrode placement. Electrical activity was reviewed with band pass filter of 1-70Hz , sensitivity of 7 uV/mm, display speed of 59mm/sec with a 60Hz  notched filter applied as appropriate. EEG data were recorded continuously and digitally stored.  Video monitoring was available and reviewed as appropriate.   Description: EEG showed near continuous generalized 5-8 Hz theta-alpha activity admixed with intermittent generalized 2-3Hz  delta slowing. Hyperventilation and photic stimulation were not performed.      ABNORMALITY - Continuous slow, generalized   IMPRESSION: This study is suggestive of moderate to severe diffuse encephalopathy, nonspecific etiology but likely related to sedation. No seizures or epileptiform discharges were seen throughout the recording.   Bill Brooks [name]

## 2021-11-18 NOTE — Progress Notes (Signed)
CSW was alerted that pt's sister Threasa Beards wanted a call from the Shepherd. CSW spoke with Threasa Beards and she explained that the pt was homeless prior to admission. She explained that pt is not married and does not have any children therefore she would be his Scientist, research (medical). Threasa Beards is requesting assistance with filing for insurance for pt. Threasa Beards stated that she would be willing for pt to be discharged home with her and it is the address on the face sheet. CSW informed Threasa Beards that a TOC staff would follow up with her for further discharge planning.

## 2021-11-18 NOTE — Progress Notes (Signed)
Trauma Event Note   TRN rounded on pt.  Chase, pt's bedside nurse addressed some concerns with me about pt having several episodes of emesis and not having immediate response to enema.  TRN went in room with Chase and the enema had come back out after approximately 40 min of administration of it; pt also had a small amount of stool.  NGT had been placed to [facility] and by the time I left, 350ml had been emptied into the canister.  Per Dr. Redmond Pulling, orders to go ahead and give milk of mag.     Last imported Vital Signs BP 114/71 (BP Location: Left Leg)   Pulse 93   Temp (!) 101 F (38.3 C) (Axillary) Comment: RN notified  Resp 18   Ht 6\' 1"  (1.854 m)   Wt 86.5 kg   SpO2 93%   BMI 25.16 kg/m   Trending CBC Recent Labs    11/16/21 0254 11/16/21 0507 11/17/21 0408 11/18/21 0331 11/18/21 0332 11/18/21 1043  WBC 8.2  --  8.1 9.1  --   --   HGB 7.8*   < > 7.1* 6.8* 7.1* 7.4*  HCT 24.6*   < > 22.8* 21.2* 21.0* 23.6*  PLT 142*  --  152 186  --   --    < > = values in this interval not displayed.    Trending Coag's No results for input(s): "APTT", "INR" in the last 72 hours.  Trending BMET Recent Labs    11/16/21 0254 11/16/21 0507 11/17/21 0408 11/18/21 0331 11/18/21 0332  NA 141   < > 139 140 143  K 4.3   < > 4.6 4.2 4.2  CL 115*  --  111 111  --   CO2 19*  --  20* 21*  --   BUN 23  --  33* 42*  --   CREATININE 1.52*  --  1.50* 1.68*  --   GLUCOSE 118*  --  127* 181*  --    < > = values in this interval not displayed.    Clovis Cao  Trauma Response RN  Please call TRN at [phone] for further assistance.

## 2021-11-18 NOTE — Progress Notes (Signed)
1045: Pt. With additional episode of emesis while RN at bedside. HOB elevated. Unable to give Zofran. Dr. Redmond Pulling paged.   1055: Results of xray verbalized to Dr. Redmond Pulling and MD notified of additional episode of emesis. Verbal orders given for - NPO except per tube meds - Hook OG to wall suction - Administer enema to attempt to clear stool in colon.

## 2021-11-18 NOTE — Progress Notes (Signed)
38: RN and NT attempting to turn pt. Tube feedings held, HOB maintained at 30 degrees during turn. Pt with one episode of emesis. Oral suctioning immediately implemented to reduce aspiration risk. HOB raised. PRN Zofran given. Dr. Redmond Pulling paged.  0945: MD verbal order for chest and abdominal x-ray and to hold tube feedings at this time. RN to call MD with results of x-ray.

## 2021-11-19 ENCOUNTER — Inpatient Hospital Stay (HOSPITAL_COMMUNITY): Payer: Self-pay

## 2021-11-19 DIAGNOSIS — J930 Spontaneous tension pneumothorax: Secondary | ICD-10-CM

## 2021-11-19 LAB — POCT I-STAT 7, (LYTES, BLD GAS, ICA,H+H)
Acid-base deficit: 2 mmol/L (ref 0.0–2.0)
Bicarbonate: 23.3 mmol/L (ref 20.0–28.0)
Calcium, Ion: 1.27 mmol/L (ref 1.15–1.40)
HCT: 21 % — ABNORMAL LOW (ref 39.0–52.0)
Hemoglobin: 7.1 g/dL — ABNORMAL LOW (ref 13.0–17.0)
O2 Saturation: 100 %
Patient temperature: 99.2
Potassium: 4.5 mmol/L (ref 3.5–5.1)
Sodium: 144 mmol/L (ref 135–145)
TCO2: 24 mmol/L (ref 22–32)
pCO2 arterial: 39.9 mmHg (ref 32–48)
pH, Arterial: 7.376 (ref 7.35–7.45)
pO2, Arterial: 182 mmHg — ABNORMAL HIGH (ref 83–108)

## 2021-11-19 LAB — BASIC METABOLIC PANEL
Anion gap: 10 (ref 5–15)
BUN: 45 mg/dL — ABNORMAL HIGH (ref 8–23)
CO2: 21 mmol/L — ABNORMAL LOW (ref 22–32)
Calcium: 8.6 mg/dL — ABNORMAL LOW (ref 8.9–10.3)
Chloride: 113 mmol/L — ABNORMAL HIGH (ref 98–111)
Creatinine, Ser: 1.82 mg/dL — ABNORMAL HIGH (ref 0.61–1.24)
GFR, Estimated: 41 mL/min — ABNORMAL LOW (ref >60)
Glucose, Bld: 110 mg/dL — ABNORMAL HIGH (ref 70–99)
Potassium: 4.2 mmol/L (ref 3.5–5.1)
Sodium: 144 mmol/L (ref 135–145)

## 2021-11-19 LAB — CBC
HCT: 22.6 % — ABNORMAL LOW (ref 39.0–52.0)
Hemoglobin: 7.2 g/dL — ABNORMAL LOW (ref 13.0–17.0)
MCH: 28.3 pg (ref 26.0–34.0)
MCHC: 31.9 g/dL (ref 30.0–36.0)
MCV: 89 fL (ref 80.0–100.0)
Platelets: 235 10*3/uL (ref 150–400)
RBC: 2.54 MIL/uL — ABNORMAL LOW (ref 4.22–5.81)
RDW: 15.9 % — ABNORMAL HIGH (ref 11.5–15.5)
WBC: 10.7 10*3/uL — ABNORMAL HIGH (ref 4.0–10.5)
nRBC: 0 % (ref 0.0–0.2)

## 2021-11-19 LAB — CULTURE, RESPIRATORY W GRAM STAIN

## 2021-11-19 LAB — GLUCOSE, CAPILLARY
Glucose-Capillary: 104 mg/dL — ABNORMAL HIGH (ref 70–99)
Glucose-Capillary: 108 mg/dL — ABNORMAL HIGH (ref 70–99)
Glucose-Capillary: 102 mg/dL — ABNORMAL HIGH (ref 70–99)
Glucose-Capillary: 116 mg/dL — ABNORMAL HIGH (ref 70–99)
Glucose-Capillary: 75 mg/dL (ref 70–99)
Glucose-Capillary: 99 mg/dL (ref 70–99)
Glucose-Capillary: 101 mg/dL — ABNORMAL HIGH (ref 70–99)

## 2021-11-19 LAB — HEPATIC FUNCTION PANEL
ALT: 56 U/L — ABNORMAL HIGH (ref 0–44)
AST: 52 U/L — ABNORMAL HIGH (ref 15–41)
Albumin: 2.1 g/dL — ABNORMAL LOW (ref 3.5–5.0)
Alkaline Phosphatase: 96 U/L (ref 38–126)
Bilirubin, Direct: 0.3 mg/dL — ABNORMAL HIGH (ref 0.0–0.2)
Indirect Bilirubin: 0.9 mg/dL (ref 0.3–0.9)
Total Bilirubin: 1.2 mg/dL (ref 0.3–1.2)
Total Protein: 5.9 g/dL — ABNORMAL LOW (ref 6.5–8.1)

## 2021-11-19 MED ORDER — IOHEXOL 9 MG/ML PO SOLN
ORAL | Status: AC
  Filled 2021-11-19: qty 1000

## 2021-11-19 MED ORDER — LIDOCAINE HCL (PF) 1 % IJ SOLN
10.0000 mL | Freq: Once | INTRAMUSCULAR | Status: AC
  Administered 2021-11-19: 10 mL
  Filled 2021-11-19: qty 10

## 2021-11-19 MED ORDER — IOHEXOL 350 MG/ML SOLN
70.0000 mL | Freq: Once | INTRAVENOUS | Status: AC | PRN
  Administered 2021-11-19: 70 mL via INTRAVENOUS

## 2021-11-19 NOTE — Progress Notes (Signed)
RT transported patient from 4N28 to CT and back with RN. No complications. RT will continue to monitor.  
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
## 2021-11-19 NOTE — Progress Notes (Signed)
While x-ray attempting to obtain film with patient supine, pt. suddenly became desynchronous, BP on art line reading in 190s, RN noted smell of tube feeding and then saw vomit coming from patient's mouth. HOB elevated immediately, oral suctioning applied, Zofran given, in-line suctioning applied with mucous plugs and bloody secretions noted. SpO2 85%. RT called to bedside. Dr. Redmond Pulling paged and notified of findings and inability to obtain supine x-ray at this time. Per MD, ok to not obtain supine x-ray at this time.   Per MD, flush OG to ensure patency and maintain at suction. MD will review x-ray once resulted to determine next steps.
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
## 2021-11-19 NOTE — Progress Notes (Signed)
RT assisted MD with bronchoscopy procedure. RN and second RT at bedside. Patient tolerated well. RT will continue to monitor.

## 2021-11-19 NOTE — Progress Notes (Signed)
1320: RN entered room to find patient SpO2 85% on 50% FiO2. RN performed in-line suctioning and found large tube feed colored mucous plugs. Pt. Desaturating to SpO2 65%. RT called. After 5 suction passes pt. Saturation improved to 95%. Dr. Redmond Pulling paged and notified of event. RN raised concern regarding potential for ETT plugging off in future. RN advised that OG seems to be functioning appropriately. MD in OR, will come to assess patient afterwards since patient SpO2 stable at this moment.

## 2021-11-19 NOTE — Procedures (Signed)
Bronchoscopy Procedure Note  Bill Brooks  [id number]  17-Sep-1960  Date:11/19/21  Time:3:02 PM   Provider Performing:Keyosha Tiedt R Johnhenry Tippin   Procedure(s):  Flexible Bronchoscopy (81771)  Indication(s) Desaturation on mechanical ventilation with thick secretions  Consent Unable to obtain consent due to emergent nature of procedure.  Anesthesia 10 ml lidocaine via ETT, 2 mg IV versed bolus, 100 mcg fentanyl bolus, continuous drips of fentanyl and versed ( preceding order for mechanical ventilation)   Time Out Verified patient identification, verified procedure, site/side was marked, verified correct patient position, special equipment/implants available, medications/allergies/relevant history reviewed, required imaging and test results available.   Sterile Technique Usual hand hygiene, masks, gowns, and gloves were used   Procedure Description Bronchoscope advanced through endotracheal tube and into airway.  Airways were examined down to subsegmental level with findings noted below.    Findings:  Normal mucosa and anatomy without secretion or significant abnromality   Complications/Tolerance None; patient tolerated the procedure well. Chest X-ray is not needed post procedure.   EBL none   Specimen(s) N/a

## 2021-11-19 NOTE — Progress Notes (Signed)
Patient remains on schedule at this time but will  will wait to speak with Trauma Service before proceeding to make sure deemed prudent.  Bill Culberson, MD [facility], [facility] [phone]

## 2021-11-20 ENCOUNTER — Inpatient Hospital Stay (HOSPITAL_COMMUNITY): Payer: Self-pay | Admitting: Certified Registered"

## 2021-11-20 ENCOUNTER — Inpatient Hospital Stay (HOSPITAL_COMMUNITY): Payer: Self-pay

## 2021-11-20 ENCOUNTER — Other Ambulatory Visit: Payer: Self-pay

## 2021-11-20 ENCOUNTER — Encounter (HOSPITAL_COMMUNITY): Admission: EM | Disposition: A | Discharge status: Rehabilitation Facility | Payer: Self-pay | Source: Home / Self Care

## 2021-11-20 DIAGNOSIS — D649 Anemia, unspecified: Secondary | ICD-10-CM

## 2021-11-20 DIAGNOSIS — S42301A Unspecified fracture of shaft of humerus, right arm, initial encounter for closed fracture: Secondary | ICD-10-CM

## 2021-11-20 DIAGNOSIS — S42001A Fracture of unspecified part of right clavicle, initial encounter for closed fracture: Secondary | ICD-10-CM

## 2021-11-20 HISTORY — PX: ORIF HUMERUS FRACTURE: SHX2126

## 2021-11-20 HISTORY — PX: ORIF SHOULDER FRACTURE: SHX5035

## 2021-11-20 HISTORY — PX: ORIF CLAVICULAR FRACTURE: SHX5055

## 2021-11-20 LAB — PREPARE RBC (CROSSMATCH)

## 2021-11-20 LAB — GLUCOSE, CAPILLARY
Glucose-Capillary: 98 mg/dL (ref 70–99)
Glucose-Capillary: 87 mg/dL (ref 70–99)
Glucose-Capillary: 102 mg/dL — ABNORMAL HIGH (ref 70–99)
Glucose-Capillary: 102 mg/dL — ABNORMAL HIGH (ref 70–99)

## 2021-11-20 LAB — POCT I-STAT 7, (LYTES, BLD GAS, ICA,H+H)
Acid-base deficit: 7 mmol/L — ABNORMAL HIGH (ref 0.0–2.0)
Acid-base deficit: 7 mmol/L — ABNORMAL HIGH (ref 0.0–2.0)
Bicarbonate: 20.3 mmol/L (ref 20.0–28.0)
Bicarbonate: 20.3 mmol/L (ref 20.0–28.0)
Calcium, Ion: 1.28 mmol/L (ref 1.15–1.40)
Calcium, Ion: 1.29 mmol/L (ref 1.15–1.40)
HCT: 20 % — ABNORMAL LOW (ref 39.0–52.0)
HCT: 25 % — ABNORMAL LOW (ref 39.0–52.0)
Hemoglobin: 6.8 g/dL — CL (ref 13.0–17.0)
Hemoglobin: 8.5 g/dL — ABNORMAL LOW (ref 13.0–17.0)
O2 Saturation: 90 %
O2 Saturation: 92 %
Patient temperature: 36.4
Patient temperature: 36.5
Potassium: 4.1 mmol/L (ref 3.5–5.1)
Potassium: 4.5 mmol/L (ref 3.5–5.1)
Sodium: 145 mmol/L (ref 135–145)
Sodium: 144 mmol/L (ref 135–145)
TCO2: 22 mmol/L (ref 22–32)
TCO2: 22 mmol/L (ref 22–32)
pCO2 arterial: 47.8 mmHg (ref 32–48)
pCO2 arterial: 45.3 mmHg (ref 32–48)
pH, Arterial: 7.233 — ABNORMAL LOW (ref 7.35–7.45)
pH, Arterial: 7.257 — ABNORMAL LOW (ref 7.35–7.45)
pO2, Arterial: 67 mmHg — ABNORMAL LOW (ref 83–108)
pO2, Arterial: 71 mmHg — ABNORMAL LOW (ref 83–108)

## 2021-11-20 LAB — CBC
HCT: 22.3 % — ABNORMAL LOW (ref 39.0–52.0)
Hemoglobin: 7.3 g/dL — ABNORMAL LOW (ref 13.0–17.0)
MCH: 29.1 pg (ref 26.0–34.0)
MCHC: 32.7 g/dL (ref 30.0–36.0)
MCV: 88.8 fL (ref 80.0–100.0)
Platelets: 292 10*3/uL (ref 150–400)
RBC: 2.51 MIL/uL — ABNORMAL LOW (ref 4.22–5.81)
RDW: 16.2 % — ABNORMAL HIGH (ref 11.5–15.5)
WBC: 12.9 10*3/uL — ABNORMAL HIGH (ref 4.0–10.5)
nRBC: 0.2 % (ref 0.0–0.2)

## 2021-11-20 LAB — BASIC METABOLIC PANEL
Anion gap: 8 (ref 5–15)
BUN: 47 mg/dL — ABNORMAL HIGH (ref 8–23)
CO2: 20 mmol/L — ABNORMAL LOW (ref 22–32)
Calcium: 9 mg/dL (ref 8.9–10.3)
Chloride: 114 mmol/L — ABNORMAL HIGH (ref 98–111)
Creatinine, Ser: 1.84 mg/dL — ABNORMAL HIGH (ref 0.61–1.24)
GFR, Estimated: 41 mL/min — ABNORMAL LOW (ref >60)
Glucose, Bld: 104 mg/dL — ABNORMAL HIGH (ref 70–99)
Potassium: 4.3 mmol/L (ref 3.5–5.1)
Sodium: 142 mmol/L (ref 135–145)

## 2021-11-20 SURGERY — OPEN REDUCTION INTERNAL FIXATION (ORIF) HUMERAL SHAFT FRACTURE
Anesthesia: General | Site: Shoulder | Laterality: Right

## 2021-11-20 MED ORDER — FLEET ENEMA 7-19 GM/118ML RE ENEM
1.0000 | ENEMA | Freq: Once | RECTAL | Status: AC
  Administered 2021-11-20: 1 via RECTAL
  Filled 2021-11-20: qty 1

## 2021-11-20 MED ORDER — MIDAZOLAM HCL 2 MG/2ML IJ SOLN
INTRAMUSCULAR | Status: DC | PRN
  Administered 2021-11-20: 2 mg via INTRAVENOUS

## 2021-11-20 MED ORDER — FENTANYL CITRATE (PF) 250 MCG/5ML IJ SOLN
INTRAMUSCULAR | Status: AC
  Filled 2021-11-20: qty 5

## 2021-11-20 MED ORDER — ALBUMIN HUMAN 5 % IV SOLN: 12.5000 g | Freq: Once | INTRAVENOUS | Status: DC

## 2021-11-20 MED ORDER — FENTANYL CITRATE (PF) 250 MCG/5ML IJ SOLN
INTRAMUSCULAR | Status: DC | PRN
  Administered 2021-11-20 (×5): 50 ug via INTRAVENOUS

## 2021-11-20 MED ORDER — SODIUM CHLORIDE 0.9 % IV SOLN: INTRAVENOUS | Status: DC | PRN

## 2021-11-20 MED ORDER — PROPOFOL 10 MG/ML IV BOLUS
INTRAVENOUS | Status: AC
  Filled 2021-11-20: qty 20

## 2021-11-20 MED ORDER — 0.9 % SODIUM CHLORIDE (POUR BTL) OPTIME
TOPICAL | Status: DC | PRN
  Administered 2021-11-20: 1000 mL

## 2021-11-20 MED ORDER — MIDAZOLAM HCL 2 MG/2ML IJ SOLN
INTRAMUSCULAR | Status: AC
  Filled 2021-11-20: qty 2

## 2021-11-20 MED ORDER — ROCURONIUM BROMIDE 10 MG/ML (PF) SYRINGE
PREFILLED_SYRINGE | INTRAVENOUS | Status: AC
  Filled 2021-11-20: qty 10

## 2021-11-20 MED ORDER — LIDOCAINE 2% (20 MG/ML) 5 ML SYRINGE
INTRAMUSCULAR | Status: AC
  Filled 2021-11-20: qty 5

## 2021-11-20 MED ORDER — ACETAMINOPHEN 10 MG/ML IV SOLN
1000.0000 mg | Freq: Four times a day (QID) | INTRAVENOUS | Status: AC
  Administered 2021-11-20 (×3): 1000 mg via INTRAVENOUS
  Filled 2021-11-20 (×3): qty 100

## 2021-11-20 MED ORDER — SODIUM CHLORIDE 0.9 % IV SOLN: INTRAVENOUS | Status: DC

## 2021-11-20 MED ORDER — ROCURONIUM BROMIDE 10 MG/ML (PF) SYRINGE
PREFILLED_SYRINGE | INTRAVENOUS | Status: DC | PRN
  Administered 2021-11-20 (×4): 100 mg via INTRAVENOUS

## 2021-11-20 MED ORDER — LACTATED RINGERS IV SOLN: INTRAVENOUS | Status: DC | PRN

## 2021-11-20 SURGICAL SUPPLY — 115 items
APL SKNCLS STERI-STRIP NONHPOA (GAUZE/BANDAGES/DRESSINGS)
BAG COUNTER SPONGE SURGICOUNT (BAG) ×2 IMPLANT
BAG SPNG CNTER NS LX DISP (BAG) ×2
BENZOIN TINCTURE AMPULE (MISCELLANEOUS) IMPLANT
BENZOIN TINCTURE PRP APPL 2/3 (GAUZE/BANDAGES/DRESSINGS) ×4 IMPLANT
BIT DRILL 110X30X2XCALB (BIT) IMPLANT
BIT DRILL CALIB QC 170X80 (BIT) IMPLANT
BIT DRILL CANN 3.0 QC 215 (BIT) IMPLANT
BIT DRILL L45 QC 2.7X125 (BIT) IMPLANT
BIT DRILL QC 2.0X100 (BIT) ×2
BIT DRILL QC 2.5X135 (BIT) IMPLANT
BIT DRILL QC 2.7X125 (BIT) ×2
BIT DRILL QC 2X140 (BIT) IMPLANT
BIT DRL 110X30X2XCALB (BIT) ×2
BLADE SURG 10 STRL SS (BLADE) IMPLANT
BLADE SURG 15 STRL LF DISP TIS (BLADE) IMPLANT
BLADE SURG 15 STRL SS (BLADE) ×2
BNDG CMPR 9X4 STRL LF SNTH (GAUZE/BANDAGES/DRESSINGS)
BNDG COHESIVE 4X5 TAN STRL (GAUZE/BANDAGES/DRESSINGS) IMPLANT
BNDG ESMARK 4X9 LF (GAUZE/BANDAGES/DRESSINGS) ×2 IMPLANT
BNDG GAUZE DERMACEA FLUFF 4 (GAUZE/BANDAGES/DRESSINGS) ×4 IMPLANT
BNDG GZE DERMACEA 4 6PLY (GAUZE/BANDAGES/DRESSINGS) ×4
BRUSH SCRUB EZ PLAIN DRY (MISCELLANEOUS) ×4 IMPLANT
CLSR STERI-STRIP ANTIMIC 1/2X4 (GAUZE/BANDAGES/DRESSINGS) IMPLANT
CORD BIPOLAR FORCEPS 12FT (ELECTRODE) ×2 IMPLANT
COVER SURGICAL LIGHT HANDLE (MISCELLANEOUS) ×4 IMPLANT
DRAPE C-ARM 42X72 X-RAY (DRAPES) ×2 IMPLANT
DRAPE HALF SHEET 40X57 (DRAPES) IMPLANT
DRAPE INCISE IOBAN 66X45 STRL (DRAPES) ×2 IMPLANT
DRAPE LAPAROTOMY TRNSV 102X78 (DRAPES) ×2 IMPLANT
DRAPE ORTHO SPLIT 77X108 STRL (DRAPES) ×8
DRAPE SURG ORHT 6 SPLT 77X108 (DRAPES) ×4 IMPLANT
DRAPE U-SHAPE 47X51 STRL (DRAPES) ×4 IMPLANT
DRSG ADAPTIC 3X8 NADH LF (GAUZE/BANDAGES/DRESSINGS) ×2 IMPLANT
DRSG MEPILEX BORDER 4X12 (GAUZE/BANDAGES/DRESSINGS) IMPLANT
DRSG MEPILEX BORDER 4X8 (GAUZE/BANDAGES/DRESSINGS) ×2 IMPLANT
DRSG MEPILEX POST OP 4X8 (GAUZE/BANDAGES/DRESSINGS) IMPLANT
ELECT REM PT RETURN 9FT ADLT (ELECTROSURGICAL) ×2
ELECTRODE REM PT RTRN 9FT ADLT (ELECTROSURGICAL) ×2 IMPLANT
EVACUATOR 1/8 PVC DRAIN (DRAIN) IMPLANT
GAUZE PAD ABD 8X10 STRL (GAUZE/BANDAGES/DRESSINGS) ×2 IMPLANT
GAUZE SPONGE 4X4 12PLY STRL (GAUZE/BANDAGES/DRESSINGS) ×4 IMPLANT
GLOVE BIO SURGEON STRL SZ 6 (GLOVE) IMPLANT
GLOVE BIO SURGEON STRL SZ7.5 (GLOVE) ×2 IMPLANT
GLOVE BIO SURGEON STRL SZ8 (GLOVE) ×2 IMPLANT
GLOVE BIOGEL PI IND STRL 7.5 (GLOVE) ×2 IMPLANT
GLOVE BIOGEL PI IND STRL 8 (GLOVE) ×2 IMPLANT
GLOVE SURG ORTHO LTX SZ7.5 (GLOVE) ×4 IMPLANT
GLOVE SURG SS PI 7.5 STRL IVOR (GLOVE) IMPLANT
GOWN STRL REUS W/ TWL LRG LVL3 (GOWN DISPOSABLE) ×4 IMPLANT
GOWN STRL REUS W/ TWL XL LVL3 (GOWN DISPOSABLE) ×2 IMPLANT
GOWN STRL REUS W/TWL LRG LVL3 (GOWN DISPOSABLE) ×8
GOWN STRL REUS W/TWL XL LVL3 (GOWN DISPOSABLE) ×2
GUIDEWIRE 1.6 THREADED 220 (WIRE) IMPLANT
K-WIRE 1.6X150 (WIRE) ×2
KIT BASIN OR (CUSTOM PROCEDURE TRAY) ×2 IMPLANT
KIT TURNOVER KIT B (KITS) ×2 IMPLANT
KWIRE 1.6X150 (WIRE) IMPLANT
MANIFOLD NEPTUNE II (INSTRUMENTS) ×2 IMPLANT
NDL HYPO 25GX1X1/2 BEV (NEEDLE) IMPLANT
NDL HYPO 25X1 1.5 SAFETY (NEEDLE) ×2 IMPLANT
NEEDLE HYPO 25GX1X1/2 BEV (NEEDLE) IMPLANT
NEEDLE HYPO 25X1 1.5 SAFETY (NEEDLE) ×2 IMPLANT
NS IRRIG 1000ML POUR BTL (IV SOLUTION) ×2 IMPLANT
PACK GENERAL/GYN (CUSTOM PROCEDURE TRAY) ×2 IMPLANT
PACK ORTHO EXTREMITY (CUSTOM PROCEDURE TRAY) ×2 IMPLANT
PAD ARMBOARD 7.5X6 YLW CONV (MISCELLANEOUS) ×4 IMPLANT
PLATE LCP 3.533X150 11H (Plate) IMPLANT
PLATE LCP CLAV LAT 2.7 RT (Plate) IMPLANT
SCREW CORTEX 2.7X28 (Screw) IMPLANT
SCREW LOCK CORT ST 3.5X28 (Screw) IMPLANT
SCREW LOCK CORT ST 3.5X30 (Screw) IMPLANT
SCREW LOCK T15 FT 30X3.5X2.9X (Screw) IMPLANT
SCREW LOCK T15 FT 32X3.5X2.9X (Screw) IMPLANT
SCREW LOCK VA ST 2.7X10 (Screw) IMPLANT
SCREW LOCK VA ST 2.7X12 (Screw) IMPLANT
SCREW LOCK VA ST 2.7X14 (Screw) IMPLANT
SCREW LOCK VA ST 2.7X18 (Screw) IMPLANT
SCREW LOCKING 2.7X16MM VA (Screw) IMPLANT
SCREW LOCKING 3.5X30 (Screw) ×10 IMPLANT
SCREW LOCKING 3.5X32 (Screw) ×2 IMPLANT
SCREW METAPHYSCAL 18MM (Screw) IMPLANT
SCREW METAPHYSEAL 2.7X20MM (Screw) IMPLANT
SCREW NON LOCK 2.7X16MM (Screw) IMPLANT
SCREW PA 4.5X20 (Screw) IMPLANT
SCREW TI CANN 4.5X42 (Screw) IMPLANT
SCREW TI CANN 4.5X50 (Screw) IMPLANT
SLING ARM IMMOBILIZER LRG (SOFTGOODS) IMPLANT
SLING ARM IMMOBILIZER MED (SOFTGOODS) IMPLANT
SPONGE T-LAP 18X18 ~~LOC~~+RFID (SPONGE) ×2 IMPLANT
STAPLER VISISTAT 35W (STAPLE) ×2 IMPLANT
STOCKINETTE IMPERVIOUS 9X36 MD (GAUZE/BANDAGES/DRESSINGS) IMPLANT
STRIP CLOSURE SKIN 1/2X4 (GAUZE/BANDAGES/DRESSINGS) ×2 IMPLANT
SUCTION FRAZIER HANDLE 10FR (MISCELLANEOUS) ×2
SUCTION TUBE FRAZIER 10FR DISP (MISCELLANEOUS) ×2 IMPLANT
SUT ETHILON 2 0 FS 18 (SUTURE) ×4 IMPLANT
SUT ETHILON 2 0 PSLX (SUTURE) IMPLANT
SUT FIBERWIRE #2 38 T-5 BLUE (SUTURE) ×2
SUT MNCRL AB 3-0 PS2 27 (SUTURE) ×2 IMPLANT
SUT PDS AB 2-0 CT1 27 (SUTURE) IMPLANT
SUT VIC AB 0 CT1 27 (SUTURE)
SUT VIC AB 0 CT1 27XBRD ANBCTR (SUTURE) ×4 IMPLANT
SUT VIC AB 1 CT1 36 (SUTURE) IMPLANT
SUT VIC AB 2-0 CT1 27 (SUTURE) ×6
SUT VIC AB 2-0 CT1 TAPERPNT 27 (SUTURE) ×4 IMPLANT
SUT VIC AB CT1 27XBRD ANBCTRL (SUTURE)
SUTURE FIBERWR #2 38 T-5 BLUE (SUTURE) IMPLANT
SYR 5ML LL (SYRINGE) IMPLANT
SYR CONTROL 10ML LL (SYRINGE) ×2 IMPLANT
TOWEL GREEN STERILE (TOWEL DISPOSABLE) ×6 IMPLANT
TOWEL GREEN STERILE FF (TOWEL DISPOSABLE) ×4 IMPLANT
TRAY FOLEY MTR SLVR 16FR STAT (SET/KITS/TRAYS/PACK) IMPLANT
TUBE CONNECTING 12X1/4 (SUCTIONS) ×2 IMPLANT
WATER STERILE IRR 1000ML POUR (IV SOLUTION) ×2 IMPLANT
YANKAUER SUCT BULB TIP NO VENT (SUCTIONS) IMPLANT

## 2021-11-20 NOTE — Progress Notes (Signed)
Patient deemed stable for OR this am by Dr. Grandville Silos of the Trauma Service.  The risks and benefits of surgery were discussed with the patient's family, including the possibility of infection, nerve injury, vessel injury, wound breakdown, arthritis, symptomatic hardware, DVT/ PE, loss of motion, malunion, nonunion, and need for further surgery among others. These risks were acknowledged and consent provided to proceed.  Altamese Muir, MD [facility], [facility] [phone]

## 2021-11-20 NOTE — Anesthesia Postprocedure Evaluation (Signed)
Anesthesia Post Note  Patient: Orian Figueira.  Procedure(s) Performed: OPEN REDUCTION INTERNAL FIXATION (ORIF) HUMERAL SHAFT FRACTURE (Right: Arm Upper) OPEN REDUCTION INTERNAL FIXATION (ORIF) CLAVICULAR FRACTURE (Right: Arm Upper) OPEN REDUCTION INTERNAL FIXATION (ORIF) FRACTURE OF THE LEFT CHOROCOID PROCESS (Left: Shoulder) OPEN REDUCTION INTERNAL FIXATION (ORIF) LEFT ACROMIUM (Left: Shoulder)     Patient location during evaluation: ICU Anesthesia Type: General Level of consciousness: sedated and patient remains intubated per anesthesia plan Pain management: pain level controlled Vital Signs Assessment: post-procedure vital signs reviewed and stable Respiratory status: patient remains intubated per anesthesia plan and patient on ventilator - see flowsheet for VS Cardiovascular status: stable Postop Assessment: no apparent nausea or vomiting Anesthetic complications: no   No notable events documented.  Last Vitals:  Vitals:   11/20/21 0800 11/20/21 1400  BP: 128/79 135/85  Pulse: 67 97  Resp: (!) 22 12  Temp: 37.6 C 37.2 C  SpO2: 100% 92%    Last Pain:  Vitals:   11/20/21 1400  TempSrc: Axillary  PainSc:                  Sonjia Wilcoxson,E. Neev Mcmains
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
## 2021-11-20 NOTE — Progress Notes (Signed)
Spoke with pt's sister, Threasa Beards, on the phone and got a 2 RN consent for surgery this am with ortho. Consent in the chart and pt being prepped to go down to the OR. Skylan Lara, Rande Brunt, RN
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
## 2021-11-20 NOTE — Transfer of Care (Signed)
Immediate Anesthesia Transfer of Care Note  Patient: Bill Brooks.  Procedure(s) Performed: OPEN REDUCTION INTERNAL FIXATION (ORIF) HUMERAL SHAFT FRACTURE (Right: Arm Upper) OPEN REDUCTION INTERNAL FIXATION (ORIF) CLAVICULAR FRACTURE (Right: Arm Upper) OPEN REDUCTION INTERNAL FIXATION (ORIF) FRACTURE OF THE LEFT CHOROCOID PROCESS (Left: Shoulder) OPEN REDUCTION INTERNAL FIXATION (ORIF) LEFT ACROMIUM (Left: Shoulder)  Patient Location: ICU  Anesthesia Type:General  Level of Consciousness: drowsy, patient cooperative and Patient remains intubated per anesthesia plan  Airway & Oxygen Therapy: Patient remains intubated per anesthesia plan and Patient placed on Ventilator (see vital sign flow sheet for setting)  Post-op Assessment: Report given to RN and Post -op Vital signs reviewed and stable  Post vital signs: Reviewed and stable  Last Vitals:  Vitals Value Taken Time  BP    Temp    Pulse 100 11/20/21 1404  Resp 15 11/20/21 1404  SpO2 90 % 11/20/21 1404  Vitals shown include unvalidated device data.  Last Pain:  Vitals:   11/20/21 0800  TempSrc: Oral  PainSc:          Complications: No notable events documented.

## 2021-11-21 ENCOUNTER — Inpatient Hospital Stay (HOSPITAL_COMMUNITY): Payer: Self-pay

## 2021-11-21 LAB — CBC
HCT: 26.3 % — ABNORMAL LOW (ref 39.0–52.0)
Hemoglobin: 8.4 g/dL — ABNORMAL LOW (ref 13.0–17.0)
MCH: 27.9 pg (ref 26.0–34.0)
MCHC: 31.9 g/dL (ref 30.0–36.0)
MCV: 87.4 fL (ref 80.0–100.0)
Platelets: 358 10*3/uL (ref 150–400)
RBC: 3.01 MIL/uL — ABNORMAL LOW (ref 4.22–5.81)
RDW: 17.5 % — ABNORMAL HIGH (ref 11.5–15.5)
WBC: 13 10*3/uL — ABNORMAL HIGH (ref 4.0–10.5)
nRBC: 0.2 % (ref 0.0–0.2)

## 2021-11-21 LAB — BASIC METABOLIC PANEL
Anion gap: 8 (ref 5–15)
BUN: 44 mg/dL — ABNORMAL HIGH (ref 8–23)
CO2: 20 mmol/L — ABNORMAL LOW (ref 22–32)
Calcium: 9 mg/dL (ref 8.9–10.3)
Chloride: 117 mmol/L — ABNORMAL HIGH (ref 98–111)
Creatinine, Ser: 1.56 mg/dL — ABNORMAL HIGH (ref 0.61–1.24)
GFR, Estimated: 50 mL/min — ABNORMAL LOW (ref >60)
Glucose, Bld: 103 mg/dL — ABNORMAL HIGH (ref 70–99)
Potassium: 4.3 mmol/L (ref 3.5–5.1)
Sodium: 145 mmol/L (ref 135–145)

## 2021-11-21 LAB — GLUCOSE, CAPILLARY
Glucose-Capillary: 105 mg/dL — ABNORMAL HIGH (ref 70–99)
Glucose-Capillary: 111 mg/dL — ABNORMAL HIGH (ref 70–99)
Glucose-Capillary: 134 mg/dL — ABNORMAL HIGH (ref 70–99)
Glucose-Capillary: 154 mg/dL — ABNORMAL HIGH (ref 70–99)
Glucose-Capillary: 68 mg/dL — ABNORMAL LOW (ref 70–99)
Glucose-Capillary: 89 mg/dL (ref 70–99)
Glucose-Capillary: 96 mg/dL (ref 70–99)

## 2021-11-21 MED ORDER — VITAL 1.5 CAL PO LIQD
1000.0000 mL | ORAL | Status: DC
  Administered 2021-11-21 – 2021-12-03 (×13): 1000 mL
  Filled 2021-11-21 (×4): qty 1000

## 2021-11-21 MED ORDER — METOPROLOL TARTRATE 25 MG/10 ML ORAL SUSPENSION
25.0000 mg | Freq: Two times a day (BID) | ORAL | Status: DC
  Administered 2021-11-21 – 2021-12-13 (×42): 25 mg
  Filled 2021-11-21 (×48): qty 10

## 2021-11-21 MED ORDER — PROSOURCE TF20 ENFIT COMPATIBL EN LIQD
60.0000 mL | Freq: Two times a day (BID) | ENTERAL | Status: DC
  Administered 2021-11-21 – 2021-11-29 (×18): 60 mL
  Filled 2021-11-21 (×18): qty 60

## 2021-11-21 MED ORDER — DEXTROSE 50 % IV SOLN
INTRAVENOUS | Status: AC
  Administered 2021-11-21: 25 mL
  Filled 2021-11-21: qty 50

## 2021-11-21 NOTE — Progress Notes (Signed)
Glucose 68 Dextrose  0.5 amp given will continue to monitor
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
## 2021-11-21 NOTE — Procedures (Signed)
Cortrak  Tube Type:  Cortrak - 43 inches Tube Location:  Left nare Initial Placement:  Stomach Secured by: Bridle Technique Used to Measure Tube Placement:  Marking at nare/corner of mouth Cortrak Secured At:  74 cm  Cortrak Tube Team Note:  Consult received to place a Cortrak feeding tube.   X-ray is required, abdominal x-ray has been ordered by the Cortrak team. Please confirm tube placement before using the Cortrak tube.   If the tube becomes dislodged please keep the tube and contact the Cortrak team at [email or web address] ([email or web address]) for replacement.  If after hours and replacement cannot be delayed, place a NG tube and confirm placement with an abdominal x-ray.   Annleigh Knueppel MS, RD, LDN Please refer to AMION for RD and/or RD on-call/weekend/after hours pager   

## 2021-11-22 LAB — TYPE AND SCREEN
ABO/RH(D): O POS
Antibody Screen: NEGATIVE
Unit division: 0
Unit division: 0
Unit division: 0
Unit division: 0
Unit division: 0

## 2021-11-22 LAB — BPAM RBC
Blood Product Expiration Date: 202310132359
Blood Product Expiration Date: 202310142359
Blood Product Expiration Date: 202310152359
Blood Product Expiration Date: 202310152359
Blood Product Expiration Date: 202310152359
ISSUE DATE / TIME: 202309151013
ISSUE DATE / TIME: 202309160602
ISSUE DATE / TIME: 202309180912
ISSUE DATE / TIME: 202309180912
Unit Type and Rh: 5100
Unit Type and Rh: 5100
Unit Type and Rh: 5100
Unit Type and Rh: 5100
Unit Type and Rh: 5100

## 2021-11-22 LAB — BASIC METABOLIC PANEL
Anion gap: 8 (ref 5–15)
Anion gap: 5 (ref 5–15)
BUN: 46 mg/dL — ABNORMAL HIGH (ref 8–23)
BUN: 47 mg/dL — ABNORMAL HIGH (ref 8–23)
CO2: 20 mmol/L — ABNORMAL LOW (ref 22–32)
CO2: 24 mmol/L (ref 22–32)
Calcium: 9 mg/dL (ref 8.9–10.3)
Calcium: 9 mg/dL (ref 8.9–10.3)
Chloride: 120 mmol/L — ABNORMAL HIGH (ref 98–111)
Chloride: 120 mmol/L — ABNORMAL HIGH (ref 98–111)
Creatinine, Ser: 1.3 mg/dL — ABNORMAL HIGH (ref 0.61–1.24)
Creatinine, Ser: 1.37 mg/dL — ABNORMAL HIGH (ref 0.61–1.24)
GFR, Estimated: >60 mL/min (ref >60)
GFR, Estimated: 58 mL/min — ABNORMAL LOW (ref >60)
Glucose, Bld: 152 mg/dL — ABNORMAL HIGH (ref 70–99)
Glucose, Bld: 158 mg/dL — ABNORMAL HIGH (ref 70–99)
Potassium: 4.3 mmol/L (ref 3.5–5.1)
Potassium: 4.1 mmol/L (ref 3.5–5.1)
Sodium: 148 mmol/L — ABNORMAL HIGH (ref 135–145)
Sodium: 149 mmol/L — ABNORMAL HIGH (ref 135–145)

## 2021-11-22 LAB — CBC
HCT: 28.1 % — ABNORMAL LOW (ref 39.0–52.0)
Hemoglobin: 8.9 g/dL — ABNORMAL LOW (ref 13.0–17.0)
MCH: 28 pg (ref 26.0–34.0)
MCHC: 31.7 g/dL (ref 30.0–36.0)
MCV: 88.4 fL (ref 80.0–100.0)
Platelets: 419 10*3/uL — ABNORMAL HIGH (ref 150–400)
RBC: 3.18 MIL/uL — ABNORMAL LOW (ref 4.22–5.81)
RDW: 17.3 % — ABNORMAL HIGH (ref 11.5–15.5)
WBC: 15.6 10*3/uL — ABNORMAL HIGH (ref 4.0–10.5)
nRBC: 0.3 % — ABNORMAL HIGH (ref 0.0–0.2)

## 2021-11-22 LAB — GLUCOSE, CAPILLARY
Glucose-Capillary: 146 mg/dL — ABNORMAL HIGH (ref 70–99)
Glucose-Capillary: 154 mg/dL — ABNORMAL HIGH (ref 70–99)
Glucose-Capillary: 215 mg/dL — ABNORMAL HIGH (ref 70–99)
Glucose-Capillary: 136 mg/dL — ABNORMAL HIGH (ref 70–99)
Glucose-Capillary: 133 mg/dL — ABNORMAL HIGH (ref 70–99)

## 2021-11-22 MED ORDER — SORBITOL 70 % SOLN
960.0000 mL | TOPICAL_OIL | Freq: Once | ORAL | Status: AC
  Administered 2021-11-22: 960 mL via RECTAL
  Filled 2021-11-22: qty 240

## 2021-11-22 MED ORDER — FREE WATER
200.0000 mL | Freq: Four times a day (QID) | Status: DC
  Administered 2021-11-22 – 2021-12-05 (×50): 200 mL

## 2021-11-22 MED ORDER — OXYCODONE HCL 5 MG PO TABS
5.0000 mg | ORAL_TABLET | ORAL | Status: DC | PRN
  Administered 2021-11-22 – 2021-11-23 (×4): 10 mg
  Administered 2021-11-23: 5 mg
  Administered 2021-11-23 – 2021-12-10 (×33): 10 mg
  Administered 2021-12-10: 5 mg
  Administered 2021-12-11 – 2021-12-12 (×2): 10 mg
  Filled 2021-11-22 (×18): qty 2
  Filled 2021-11-22: qty 1
  Filled 2021-11-22 (×8): qty 2
  Filled 2021-11-22: qty 1
  Filled 2021-11-22 (×13): qty 2

## 2021-11-22 MED ORDER — PANTOPRAZOLE 2 MG/ML SUSPENSION
40.0000 mg | Freq: Every day | ORAL | Status: DC
  Administered 2021-11-22 – 2021-12-08 (×16): 40 mg
  Filled 2021-11-22 (×18): qty 20

## 2021-11-22 MED ORDER — ACETAMINOPHEN 500 MG PO TABS: 1000.0000 mg | ORAL_TABLET | Freq: Four times a day (QID) | ORAL | Status: DC

## 2021-11-22 MED ORDER — FUROSEMIDE 10 MG/ML IJ SOLN
60.0000 mg | Freq: Once | INTRAMUSCULAR | Status: AC
  Administered 2021-11-22: 60 mg via INTRAVENOUS
  Filled 2021-11-22: qty 6

## 2021-11-22 MED ORDER — SENNA 8.6 MG PO TABS
2.0000 | ORAL_TABLET | Freq: Once | ORAL | Status: AC
  Administered 2021-11-22: 17.2 mg
  Filled 2021-11-22: qty 2

## 2021-11-22 MED ORDER — LACTULOSE 10 GM/15ML PO SOLN
30.0000 g | Freq: Once | ORAL | Status: AC
  Administered 2021-11-22: 30 g
  Filled 2021-11-22: qty 45

## 2021-11-22 MED ORDER — ACETAMINOPHEN 500 MG PO TABS
1000.0000 mg | ORAL_TABLET | Freq: Four times a day (QID) | ORAL | Status: DC
  Administered 2021-11-22 – 2021-12-14 (×75): 1000 mg
  Filled 2021-11-22 (×81): qty 2

## 2021-11-22 MED ORDER — DOCUSATE SODIUM 50 MG/5ML PO LIQD
100.0000 mg | Freq: Two times a day (BID) | ORAL | Status: DC
  Administered 2021-11-22 – 2021-12-07 (×21): 100 mg
  Filled 2021-11-22 (×20): qty 10

## 2021-11-22 MED ORDER — BISACODYL 5 MG PO TBEC: 10.0000 mg | DELAYED_RELEASE_TABLET | Freq: Once | ORAL | Status: DC

## 2021-11-22 MED ORDER — METHOCARBAMOL 500 MG PO TABS
1000.0000 mg | ORAL_TABLET | Freq: Three times a day (TID) | ORAL | Status: DC
  Administered 2021-11-22 – 2021-12-14 (×65): 1000 mg
  Filled 2021-11-22 (×63): qty 2

## 2021-11-22 NOTE — Progress Notes (Signed)
Patient placed back on full vent support due to increased WOB.  Dr. Bobbye Morton notified.

## 2021-11-22 NOTE — Progress Notes (Signed)
Patient placed on vent wean by Dr. Bobbye Morton.
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
## 2021-11-23 DIAGNOSIS — L8990 Pressure ulcer of unspecified site, unspecified stage: Secondary | ICD-10-CM | POA: Insufficient documentation

## 2021-11-23 LAB — GLUCOSE, CAPILLARY
Glucose-Capillary: 149 mg/dL — ABNORMAL HIGH (ref 70–99)
Glucose-Capillary: 162 mg/dL — ABNORMAL HIGH (ref 70–99)
Glucose-Capillary: 170 mg/dL — ABNORMAL HIGH (ref 70–99)
Glucose-Capillary: 170 mg/dL — ABNORMAL HIGH (ref 70–99)
Glucose-Capillary: 182 mg/dL — ABNORMAL HIGH (ref 70–99)
Glucose-Capillary: 186 mg/dL — ABNORMAL HIGH (ref 70–99)
Glucose-Capillary: 195 mg/dL — ABNORMAL HIGH (ref 70–99)

## 2021-11-23 MED ORDER — BETHANECHOL CHLORIDE 25 MG PO TABS
25.0000 mg | ORAL_TABLET | Freq: Three times a day (TID) | ORAL | Status: DC
  Administered 2021-11-23 – 2021-12-14 (×62): 25 mg
  Filled 2021-11-23 (×66): qty 1

## 2021-11-23 MED ORDER — QUETIAPINE FUMARATE 25 MG PO TABS
50.0000 mg | ORAL_TABLET | Freq: Two times a day (BID) | ORAL | Status: DC
  Administered 2021-11-23 – 2021-11-27 (×10): 50 mg
  Filled 2021-11-23 (×10): qty 2

## 2021-11-23 NOTE — Progress Notes (Addendum)
Bite block placed on ETT.  Hepa filters changed on vent.
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
## 2021-11-23 NOTE — Plan of Care (Signed)
  Problem: Education: Goal: Knowledge of General Education information will improve Description: Including pain rating scale, medication(s)/side effects and non-pharmacologic comfort measures Outcome: Not Progressing   Problem: Health Behavior/Discharge Planning: Goal: Ability to manage health-related needs will improve Outcome: Not Progressing   Problem: Clinical Measurements: Goal: Ability to maintain clinical measurements within normal limits will improve Outcome: Progressing Goal: Will remain free from infection Outcome: Progressing Goal: Diagnostic test results will improve Outcome: Progressing Goal: Respiratory complications will improve Outcome: Not Progressing Goal: Cardiovascular complication will be avoided Outcome: Progressing   Problem: Activity: Goal: Risk for activity intolerance will decrease Outcome: Not Progressing   Problem: Nutrition: Goal: Adequate nutrition will be maintained Outcome: Progressing   Problem: Coping: Goal: Level of anxiety will decrease Outcome: Progressing   Problem: Elimination: Goal: Will not experience complications related to bowel motility Outcome: Not Progressing Goal: Will not experience complications related to urinary retention Outcome: Progressing   Problem: Pain Managment: Goal: General experience of comfort will improve Outcome: Progressing   Problem: Safety: Goal: Ability to remain free from injury will improve Outcome: Progressing   Problem: Skin Integrity: Goal: Risk for impaired skin integrity will decrease Outcome: Progressing   Problem: Cardiovascular: Goal: Ability to achieve and maintain adequate cardiovascular perfusion will improve Outcome: Progressing Goal: Vascular access site(s) Level 0-1 will be maintained Outcome: Progressing   Problem: Safety: Goal: Non-violent Restraint(s) Outcome: Progressing   Problem: Coping: Goal: Ability to adjust to condition or change in health will improve Outcome:  Not Progressing   Problem: Fluid Volume: Goal: Ability to maintain a balanced intake and output will improve Outcome: Progressing   Problem: Metabolic: Goal: Ability to maintain appropriate glucose levels will improve Outcome: Progressing   Problem: Nutritional: Goal: Maintenance of adequate nutrition will improve Outcome: Progressing Goal: Progress toward achieving an optimal weight will improve Outcome: Progressing   Problem: Skin Integrity: Goal: Risk for impaired skin integrity will decrease Outcome: Progressing   Problem: Tissue Perfusion: Goal: Adequacy of tissue perfusion will improve Outcome: Progressing
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
## 2021-11-24 ENCOUNTER — Encounter (HOSPITAL_COMMUNITY): Payer: Self-pay | Admitting: Orthopedic Surgery

## 2021-11-24 ENCOUNTER — Inpatient Hospital Stay (HOSPITAL_BASED_OUTPATIENT_CLINIC_OR_DEPARTMENT_OTHER): Payer: Self-pay

## 2021-11-24 DIAGNOSIS — R609 Edema, unspecified: Secondary | ICD-10-CM

## 2021-11-24 LAB — BASIC METABOLIC PANEL
Anion gap: 8 (ref 5–15)
BUN: 45 mg/dL — ABNORMAL HIGH (ref 8–23)
CO2: 21 mmol/L — ABNORMAL LOW (ref 22–32)
Calcium: 8.9 mg/dL (ref 8.9–10.3)
Chloride: 114 mmol/L — ABNORMAL HIGH (ref 98–111)
Creatinine, Ser: 1.2 mg/dL (ref 0.61–1.24)
GFR, Estimated: >60 mL/min (ref >60)
Glucose, Bld: 162 mg/dL — ABNORMAL HIGH (ref 70–99)
Potassium: 4.4 mmol/L (ref 3.5–5.1)
Sodium: 143 mmol/L (ref 135–145)

## 2021-11-24 LAB — URINALYSIS, ROUTINE W REFLEX MICROSCOPIC
Bilirubin Urine: NEGATIVE
Glucose, UA: NEGATIVE mg/dL
Ketones, ur: NEGATIVE mg/dL
Leukocytes,Ua: NEGATIVE
Nitrite: NEGATIVE
Protein, ur: 100 mg/dL — AB
Specific Gravity, Urine: 1.017 (ref 1.005–1.030)
pH: 6 (ref 5.0–8.0)

## 2021-11-24 LAB — CBC
HCT: 27.5 % — ABNORMAL LOW (ref 39.0–52.0)
Hemoglobin: 8.5 g/dL — ABNORMAL LOW (ref 13.0–17.0)
MCH: 27.9 pg (ref 26.0–34.0)
MCHC: 30.9 g/dL (ref 30.0–36.0)
MCV: 90.2 fL (ref 80.0–100.0)
Platelets: 515 10*3/uL — ABNORMAL HIGH (ref 150–400)
RBC: 3.05 MIL/uL — ABNORMAL LOW (ref 4.22–5.81)
RDW: 17 % — ABNORMAL HIGH (ref 11.5–15.5)
WBC: 17.2 10*3/uL — ABNORMAL HIGH (ref 4.0–10.5)
nRBC: 0.1 % (ref 0.0–0.2)

## 2021-11-24 LAB — URINALYSIS, MICROSCOPIC (REFLEX)
RBC / HPF: >50 RBC/hpf (ref 0–5)
WBC, UA: >50 WBC/hpf (ref 0–5)

## 2021-11-24 LAB — GLUCOSE, CAPILLARY
Glucose-Capillary: 135 mg/dL — ABNORMAL HIGH (ref 70–99)
Glucose-Capillary: 140 mg/dL — ABNORMAL HIGH (ref 70–99)
Glucose-Capillary: 170 mg/dL — ABNORMAL HIGH (ref 70–99)
Glucose-Capillary: 175 mg/dL — ABNORMAL HIGH (ref 70–99)
Glucose-Capillary: 177 mg/dL — ABNORMAL HIGH (ref 70–99)
Glucose-Capillary: 178 mg/dL — ABNORMAL HIGH (ref 70–99)

## 2021-11-24 MED ORDER — MIDAZOLAM HCL 2 MG/2ML IJ SOLN
2.0000 mg | INTRAMUSCULAR | Status: DC | PRN
  Administered 2021-11-26 – 2021-12-17 (×15): 2 mg via INTRAVENOUS
  Filled 2021-11-24 (×15): qty 2

## 2021-11-24 MED ORDER — PROPOFOL 1000 MG/100ML IV EMUL
5.0000 ug/kg/min | INTRAVENOUS | Status: DC
  Administered 2021-11-24: 10 ug/kg/min via INTRAVENOUS
  Administered 2021-11-25: 20 ug/kg/min via INTRAVENOUS
  Administered 2021-11-25: 10 ug/kg/min via INTRAVENOUS
  Administered 2021-11-26: 20 ug/kg/min via INTRAVENOUS
  Administered 2021-11-26 (×2): 25 ug/kg/min via INTRAVENOUS
  Administered 2021-11-26 – 2021-11-27 (×2): 20 ug/kg/min via INTRAVENOUS
  Administered 2021-11-27: 15 ug/kg/min via INTRAVENOUS
  Administered 2021-11-27: 25 ug/kg/min via INTRAVENOUS
  Administered 2021-11-28 (×3): 40 ug/kg/min via INTRAVENOUS
  Administered 2021-11-28 – 2021-11-29 (×2): 30 ug/kg/min via INTRAVENOUS
  Administered 2021-11-29: 40 ug/kg/min via INTRAVENOUS
  Administered 2021-11-29: 20 ug/kg/min via INTRAVENOUS
  Administered 2021-11-29: 40 ug/kg/min via INTRAVENOUS
  Administered 2021-11-30 (×2): 30 ug/kg/min via INTRAVENOUS
  Administered 2021-11-30: 10 ug/kg/min via INTRAVENOUS
  Administered 2021-12-01: 20 ug/kg/min via INTRAVENOUS
  Administered 2021-12-01: 10 ug/kg/min via INTRAVENOUS
  Administered 2021-12-02 (×3): 30 ug/kg/min via INTRAVENOUS
  Administered 2021-12-03 (×2): 15 ug/kg/min via INTRAVENOUS
  Administered 2021-12-03: 40 ug/kg/min via INTRAVENOUS
  Administered 2021-12-04 (×2): 15 ug/kg/min via INTRAVENOUS
  Administered 2021-12-05: 25 ug/kg/min via INTRAVENOUS
  Filled 2021-11-24 (×33): qty 100

## 2021-11-24 MED ORDER — DEXTROSE 10 % IV SOLN: INTRAVENOUS | Status: DC | PRN

## 2021-11-24 MED ORDER — MIDAZOLAM HCL 2 MG/2ML IJ SOLN
4.0000 mg | Freq: Once | INTRAMUSCULAR | Status: AC
  Administered 2021-11-24: 4 mg via INTRAVENOUS

## 2021-11-24 MED ORDER — IBUPROFEN 100 MG/5ML PO SUSP
400.0000 mg | Freq: Three times a day (TID) | ORAL | Status: DC | PRN
  Administered 2021-11-24 – 2021-12-07 (×12): 400 mg
  Filled 2021-11-24 (×14): qty 20

## 2021-11-24 MED ORDER — INSULIN ASPART 100 UNIT/ML IJ SOLN
2.0000 [IU] | INTRAMUSCULAR | Status: DC
  Administered 2021-11-24 – 2021-12-21 (×90): 2 [IU] via SUBCUTANEOUS

## 2021-11-24 NOTE — Progress Notes (Signed)
102.1 fever page called to Dr. Grandville Silos Order for 400mg  Motrin Q8 PRN given

## 2021-11-24 NOTE — Progress Notes (Signed)
Bilateral lower extremity venous duplex completed. Refer to "CV Proc" under chart review to view preliminary results.  11/24/2021 2:44 PM Kelby Aline., MHA, RVT, RDCS, RDMS
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
## 2021-11-25 LAB — CBC
HCT: 26.4 % — ABNORMAL LOW (ref 39.0–52.0)
Hemoglobin: 8 g/dL — ABNORMAL LOW (ref 13.0–17.0)
MCH: 27.5 pg (ref 26.0–34.0)
MCHC: 30.3 g/dL (ref 30.0–36.0)
MCV: 90.7 fL (ref 80.0–100.0)
Platelets: 537 10*3/uL — ABNORMAL HIGH (ref 150–400)
RBC: 2.91 MIL/uL — ABNORMAL LOW (ref 4.22–5.81)
RDW: 16.9 % — ABNORMAL HIGH (ref 11.5–15.5)
WBC: 13.8 10*3/uL — ABNORMAL HIGH (ref 4.0–10.5)
nRBC: 0.1 % (ref 0.0–0.2)

## 2021-11-25 LAB — BASIC METABOLIC PANEL
Anion gap: 5 (ref 5–15)
BUN: 47 mg/dL — ABNORMAL HIGH (ref 8–23)
CO2: 23 mmol/L (ref 22–32)
Calcium: 9.1 mg/dL (ref 8.9–10.3)
Chloride: 117 mmol/L — ABNORMAL HIGH (ref 98–111)
Creatinine, Ser: 1.19 mg/dL (ref 0.61–1.24)
GFR, Estimated: >60 mL/min (ref >60)
Glucose, Bld: 155 mg/dL — ABNORMAL HIGH (ref 70–99)
Potassium: 4.5 mmol/L (ref 3.5–5.1)
Sodium: 145 mmol/L (ref 135–145)

## 2021-11-25 LAB — GLUCOSE, CAPILLARY
Glucose-Capillary: 135 mg/dL — ABNORMAL HIGH (ref 70–99)
Glucose-Capillary: 127 mg/dL — ABNORMAL HIGH (ref 70–99)
Glucose-Capillary: 188 mg/dL — ABNORMAL HIGH (ref 70–99)
Glucose-Capillary: 156 mg/dL — ABNORMAL HIGH (ref 70–99)
Glucose-Capillary: 143 mg/dL — ABNORMAL HIGH (ref 70–99)

## 2021-11-25 LAB — TRIGLYCERIDES: Triglycerides: 166 mg/dL — ABNORMAL HIGH (ref <150)

## 2021-11-26 LAB — CBC
HCT: 25.6 % — ABNORMAL LOW (ref 39.0–52.0)
Hemoglobin: 7.7 g/dL — ABNORMAL LOW (ref 13.0–17.0)
MCH: 27.6 pg (ref 26.0–34.0)
MCHC: 30.1 g/dL (ref 30.0–36.0)
MCV: 91.8 fL (ref 80.0–100.0)
Platelets: 547 10*3/uL — ABNORMAL HIGH (ref 150–400)
RBC: 2.79 MIL/uL — ABNORMAL LOW (ref 4.22–5.81)
RDW: 16.6 % — ABNORMAL HIGH (ref 11.5–15.5)
WBC: 12.9 10*3/uL — ABNORMAL HIGH (ref 4.0–10.5)
nRBC: 0 % (ref 0.0–0.2)

## 2021-11-26 LAB — BASIC METABOLIC PANEL
Anion gap: 6 (ref 5–15)
BUN: 46 mg/dL — ABNORMAL HIGH (ref 8–23)
CO2: 24 mmol/L (ref 22–32)
Calcium: 8.7 mg/dL — ABNORMAL LOW (ref 8.9–10.3)
Chloride: 114 mmol/L — ABNORMAL HIGH (ref 98–111)
Creatinine, Ser: 1.24 mg/dL (ref 0.61–1.24)
GFR, Estimated: >60 mL/min (ref >60)
Glucose, Bld: 163 mg/dL — ABNORMAL HIGH (ref 70–99)
Potassium: 4.5 mmol/L (ref 3.5–5.1)
Sodium: 144 mmol/L (ref 135–145)

## 2021-11-26 LAB — GLUCOSE, CAPILLARY
Glucose-Capillary: 103 mg/dL — ABNORMAL HIGH (ref 70–99)
Glucose-Capillary: 115 mg/dL — ABNORMAL HIGH (ref 70–99)
Glucose-Capillary: 122 mg/dL — ABNORMAL HIGH (ref 70–99)
Glucose-Capillary: 123 mg/dL — ABNORMAL HIGH (ref 70–99)
Glucose-Capillary: 133 mg/dL — ABNORMAL HIGH (ref 70–99)
Glucose-Capillary: 139 mg/dL — ABNORMAL HIGH (ref 70–99)
Glucose-Capillary: 158 mg/dL — ABNORMAL HIGH (ref 70–99)

## 2021-11-26 LAB — CULTURE, RESPIRATORY W GRAM STAIN

## 2021-11-26 NOTE — Progress Notes (Signed)
Patient does not tolerate being repositioned causes increase work of breathing bolus versed given see MAR.
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
## 2021-11-27 ENCOUNTER — Inpatient Hospital Stay (HOSPITAL_COMMUNITY): Payer: Self-pay

## 2021-11-27 LAB — BASIC METABOLIC PANEL
Anion gap: 8 (ref 5–15)
BUN: 42 mg/dL — ABNORMAL HIGH (ref 8–23)
CO2: 25 mmol/L (ref 22–32)
Calcium: 8.6 mg/dL — ABNORMAL LOW (ref 8.9–10.3)
Chloride: 110 mmol/L (ref 98–111)
Creatinine, Ser: 1.18 mg/dL (ref 0.61–1.24)
GFR, Estimated: >60 mL/min (ref >60)
Glucose, Bld: 136 mg/dL — ABNORMAL HIGH (ref 70–99)
Potassium: 4.6 mmol/L (ref 3.5–5.1)
Sodium: 143 mmol/L (ref 135–145)

## 2021-11-27 LAB — CBC
HCT: 24.9 % — ABNORMAL LOW (ref 39.0–52.0)
Hemoglobin: 8 g/dL — ABNORMAL LOW (ref 13.0–17.0)
MCH: 28.8 pg (ref 26.0–34.0)
MCHC: 32.1 g/dL (ref 30.0–36.0)
MCV: 89.6 fL (ref 80.0–100.0)
Platelets: 552 10*3/uL — ABNORMAL HIGH (ref 150–400)
RBC: 2.78 MIL/uL — ABNORMAL LOW (ref 4.22–5.81)
RDW: 16.4 % — ABNORMAL HIGH (ref 11.5–15.5)
WBC: 11.1 10*3/uL — ABNORMAL HIGH (ref 4.0–10.5)
nRBC: 0 % (ref 0.0–0.2)

## 2021-11-27 LAB — GLUCOSE, CAPILLARY
Glucose-Capillary: 100 mg/dL — ABNORMAL HIGH (ref 70–99)
Glucose-Capillary: 120 mg/dL — ABNORMAL HIGH (ref 70–99)
Glucose-Capillary: 131 mg/dL — ABNORMAL HIGH (ref 70–99)
Glucose-Capillary: 149 mg/dL — ABNORMAL HIGH (ref 70–99)
Glucose-Capillary: 152 mg/dL — ABNORMAL HIGH (ref 70–99)
Glucose-Capillary: 153 mg/dL — ABNORMAL HIGH (ref 70–99)
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
## 2021-11-28 ENCOUNTER — Inpatient Hospital Stay (HOSPITAL_COMMUNITY): Payer: Self-pay

## 2021-11-28 ENCOUNTER — Inpatient Hospital Stay (HOSPITAL_COMMUNITY): Payer: Self-pay | Admitting: Certified Registered Nurse Anesthetist

## 2021-11-28 DIAGNOSIS — J930 Spontaneous tension pneumothorax: Secondary | ICD-10-CM

## 2021-11-28 LAB — BASIC METABOLIC PANEL
Anion gap: 6 (ref 5–15)
BUN: 36 mg/dL — ABNORMAL HIGH (ref 8–23)
CO2: 26 mmol/L (ref 22–32)
Calcium: 8.2 mg/dL — ABNORMAL LOW (ref 8.9–10.3)
Chloride: 111 mmol/L (ref 98–111)
Creatinine, Ser: 0.94 mg/dL (ref 0.61–1.24)
GFR, Estimated: >60 mL/min (ref >60)
Glucose, Bld: 131 mg/dL — ABNORMAL HIGH (ref 70–99)
Potassium: 4 mmol/L (ref 3.5–5.1)
Sodium: 143 mmol/L (ref 135–145)

## 2021-11-28 LAB — GLUCOSE, CAPILLARY
Glucose-Capillary: 121 mg/dL — ABNORMAL HIGH (ref 70–99)
Glucose-Capillary: 122 mg/dL — ABNORMAL HIGH (ref 70–99)
Glucose-Capillary: 125 mg/dL — ABNORMAL HIGH (ref 70–99)
Glucose-Capillary: 131 mg/dL — ABNORMAL HIGH (ref 70–99)
Glucose-Capillary: 157 mg/dL — ABNORMAL HIGH (ref 70–99)
Glucose-Capillary: 98 mg/dL (ref 70–99)

## 2021-11-28 LAB — CBC
HCT: 24.7 % — ABNORMAL LOW (ref 39.0–52.0)
Hemoglobin: 7.6 g/dL — ABNORMAL LOW (ref 13.0–17.0)
MCH: 27.6 pg (ref 26.0–34.0)
MCHC: 30.8 g/dL (ref 30.0–36.0)
MCV: 89.8 fL (ref 80.0–100.0)
Platelets: 561 10*3/uL — ABNORMAL HIGH (ref 150–400)
RBC: 2.75 MIL/uL — ABNORMAL LOW (ref 4.22–5.81)
RDW: 16 % — ABNORMAL HIGH (ref 11.5–15.5)
WBC: 9 10*3/uL (ref 4.0–10.5)
nRBC: 0 % (ref 0.0–0.2)

## 2021-11-28 LAB — TRIGLYCERIDES: Triglycerides: 216 mg/dL — ABNORMAL HIGH (ref <150)

## 2021-11-28 MED ORDER — SODIUM CHLORIDE 0.9 % IV SOLN: INTRAVENOUS | Status: DC | PRN

## 2021-11-28 MED ORDER — IPRATROPIUM-ALBUTEROL 0.5-2.5 (3) MG/3ML IN SOLN
3.0000 mL | Freq: Three times a day (TID) | RESPIRATORY_TRACT | Status: DC
  Administered 2021-11-29 – 2021-12-01 (×7): 3 mL via RESPIRATORY_TRACT
  Filled 2021-11-28 (×7): qty 3

## 2021-11-28 MED ORDER — PROPOFOL 10 MG/ML IV BOLUS
INTRAVENOUS | Status: DC | PRN
  Administered 2021-11-28: 70 mg via INTRAVENOUS

## 2021-11-28 MED ORDER — SUCCINYLCHOLINE CHLORIDE 200 MG/10ML IV SOSY
PREFILLED_SYRINGE | INTRAVENOUS | Status: DC | PRN
  Administered 2021-11-28: 100 mg via INTRAVENOUS

## 2021-11-28 MED ORDER — ALBUTEROL SULFATE (2.5 MG/3ML) 0.083% IN NEBU: 2.5000 mg | INHALATION_SOLUTION | Freq: Four times a day (QID) | RESPIRATORY_TRACT | Status: DC | PRN

## 2021-11-28 MED ORDER — QUETIAPINE FUMARATE 100 MG PO TABS
100.0000 mg | ORAL_TABLET | Freq: Two times a day (BID) | ORAL | Status: DC
  Administered 2021-11-28 – 2021-12-01 (×7): 100 mg
  Filled 2021-11-28 (×7): qty 1

## 2021-11-28 NOTE — Anesthesia Procedure Notes (Signed)
Procedure Name: Intubation Date/Time: 11/28/2021 3:25 PM  Performed by: Griffin Dakin, CRNAPre-anesthesia Checklist: Patient identified, Emergency Drugs available, Suction available, Patient being monitored and Timeout performed Patient Re-evaluated:Patient Re-evaluated prior to induction Oxygen Delivery Method: Ambu bag Preoxygenation: Pre-oxygenation with 100% oxygen Induction Type: IV induction, Rapid sequence and Cricoid Pressure applied Laryngoscope Size: Glidescope and 4 Tube size: 7.5 mm Number of attempts: 1 Airway Equipment and Method: Video-laryngoscopy and Rigid stylet Placement Confirmation: breath sounds checked- equal and bilateral, CO2 detector and ETT inserted through vocal cords under direct vision Secured at: 26 cm Tube secured with: Tape (lip) Dental Injury: Teeth and Oropharynx as per pre-operative assessment  Comments: Previous ETT removed under direct visualization, DLx1 atraumatic

## 2021-11-28 NOTE — Progress Notes (Signed)
Patient ID: Bill Brooks., male   DOB: March 31, 1960, 61 y.o.   MRN: [id number] Notified by RT that ETT cuff was blown. Losing volumes on vent. Anesthesia reintubated him. Appreciate their help. Will check CXR.  Georganna Skeans, MD, MPH, FACS Please use [email or web address] to contact on call provider
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
## 2021-11-28 NOTE — Progress Notes (Signed)
Rt at bedside to do vent check, pt not getting full volume on vent, audible air leak heard. RT added air to cuff but unable to get reading on cuff pressure. RN notified MD about need to change ETT. Trauma MD and Anesthesia to bedside to change ett without complications.

## 2021-11-29 LAB — POCT I-STAT 7, (LYTES, BLD GAS, ICA,H+H)
Acid-Base Excess: 1 mmol/L (ref 0.0–2.0)
Bicarbonate: 25.7 mmol/L (ref 20.0–28.0)
Calcium, Ion: 1.22 mmol/L (ref 1.15–1.40)
HCT: 24 % — ABNORMAL LOW (ref 39.0–52.0)
Hemoglobin: 8.2 g/dL — ABNORMAL LOW (ref 13.0–17.0)
O2 Saturation: 90 %
Patient temperature: 98.8
Potassium: 4.1 mmol/L (ref 3.5–5.1)
Sodium: 144 mmol/L (ref 135–145)
TCO2: 27 mmol/L (ref 22–32)
pCO2 arterial: 40.4 mmHg (ref 32–48)
pH, Arterial: 7.413 (ref 7.35–7.45)
pO2, Arterial: 58 mmHg — ABNORMAL LOW (ref 83–108)

## 2021-11-29 LAB — BASIC METABOLIC PANEL
Anion gap: 5 (ref 5–15)
BUN: 38 mg/dL — ABNORMAL HIGH (ref 8–23)
CO2: 26 mmol/L (ref 22–32)
Calcium: 8.5 mg/dL — ABNORMAL LOW (ref 8.9–10.3)
Chloride: 111 mmol/L (ref 98–111)
Creatinine, Ser: 1.02 mg/dL (ref 0.61–1.24)
GFR, Estimated: >60 mL/min (ref >60)
Glucose, Bld: 122 mg/dL — ABNORMAL HIGH (ref 70–99)
Potassium: 4.2 mmol/L (ref 3.5–5.1)
Sodium: 142 mmol/L (ref 135–145)

## 2021-11-29 LAB — GLUCOSE, CAPILLARY
Glucose-Capillary: 112 mg/dL — ABNORMAL HIGH (ref 70–99)
Glucose-Capillary: 115 mg/dL — ABNORMAL HIGH (ref 70–99)
Glucose-Capillary: 121 mg/dL — ABNORMAL HIGH (ref 70–99)
Glucose-Capillary: 122 mg/dL — ABNORMAL HIGH (ref 70–99)
Glucose-Capillary: 136 mg/dL — ABNORMAL HIGH (ref 70–99)
Glucose-Capillary: 147 mg/dL — ABNORMAL HIGH (ref 70–99)

## 2021-11-29 LAB — CBC
HCT: 27 % — ABNORMAL LOW (ref 39.0–52.0)
Hemoglobin: 8.4 g/dL — ABNORMAL LOW (ref 13.0–17.0)
MCH: 28 pg (ref 26.0–34.0)
MCHC: 31.1 g/dL (ref 30.0–36.0)
MCV: 90 fL (ref 80.0–100.0)
Platelets: 634 10*3/uL — ABNORMAL HIGH (ref 150–400)
RBC: 3 MIL/uL — ABNORMAL LOW (ref 4.22–5.81)
RDW: 15.9 % — ABNORMAL HIGH (ref 11.5–15.5)
WBC: 8.5 10*3/uL (ref 4.0–10.5)
nRBC: 0 % (ref 0.0–0.2)
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
## 2021-11-30 ENCOUNTER — Inpatient Hospital Stay (HOSPITAL_COMMUNITY): Payer: Self-pay | Admitting: Anesthesiology

## 2021-11-30 ENCOUNTER — Encounter (HOSPITAL_COMMUNITY): Admission: EM | Disposition: A | Discharge status: Rehabilitation Facility | Payer: Self-pay | Source: Home / Self Care

## 2021-11-30 ENCOUNTER — Other Ambulatory Visit: Payer: Self-pay

## 2021-11-30 ENCOUNTER — Inpatient Hospital Stay (HOSPITAL_COMMUNITY): Payer: Self-pay

## 2021-11-30 DIAGNOSIS — I10 Essential (primary) hypertension: Secondary | ICD-10-CM

## 2021-11-30 DIAGNOSIS — D649 Anemia, unspecified: Secondary | ICD-10-CM

## 2021-11-30 DIAGNOSIS — J398 Other specified diseases of upper respiratory tract: Secondary | ICD-10-CM

## 2021-11-30 DIAGNOSIS — F17210 Nicotine dependence, cigarettes, uncomplicated: Secondary | ICD-10-CM

## 2021-11-30 HISTORY — PX: TRACHEOSTOMY TUBE PLACEMENT: SHX814

## 2021-11-30 HISTORY — PX: PEG PLACEMENT: SHX5437

## 2021-11-30 LAB — GLUCOSE, CAPILLARY
Glucose-Capillary: 105 mg/dL — ABNORMAL HIGH (ref 70–99)
Glucose-Capillary: 96 mg/dL (ref 70–99)
Glucose-Capillary: 124 mg/dL — ABNORMAL HIGH (ref 70–99)
Glucose-Capillary: 136 mg/dL — ABNORMAL HIGH (ref 70–99)
Glucose-Capillary: 152 mg/dL — ABNORMAL HIGH (ref 70–99)

## 2021-11-30 SURGERY — CREATION, TRACHEOSTOMY
Anesthesia: General | Site: Neck

## 2021-11-30 SURGERY — CREATION, TRACHEOSTOMY
Anesthesia: General

## 2021-11-30 MED ORDER — SUGAMMADEX SODIUM 500 MG/5ML IV SOLN
INTRAVENOUS | Status: AC
  Filled 2021-11-30: qty 5

## 2021-11-30 MED ORDER — ROCURONIUM BROMIDE 10 MG/ML (PF) SYRINGE
PREFILLED_SYRINGE | INTRAVENOUS | Status: AC
  Filled 2021-11-30: qty 30

## 2021-11-30 MED ORDER — ONDANSETRON HCL 4 MG/2ML IJ SOLN
INTRAMUSCULAR | Status: DC | PRN
  Administered 2021-11-30: 4 mg via INTRAVENOUS

## 2021-11-30 MED ORDER — FENTANYL CITRATE (PF) 250 MCG/5ML IJ SOLN
INTRAMUSCULAR | Status: AC
  Filled 2021-11-30: qty 5

## 2021-11-30 MED ORDER — LIDOCAINE HCL 1 % IJ SOLN
INTRAMUSCULAR | Status: DC | PRN
  Administered 2021-11-30: 5 mL

## 2021-11-30 MED ORDER — MIDAZOLAM HCL 2 MG/2ML IJ SOLN
INTRAMUSCULAR | Status: DC | PRN
  Administered 2021-11-30: 2 mg via INTRAVENOUS

## 2021-11-30 MED ORDER — STERILE WATER FOR IRRIGATION IR SOLN
Status: DC | PRN
  Administered 2021-11-30: 500 mL

## 2021-11-30 MED ORDER — FENTANYL CITRATE (PF) 250 MCG/5ML IJ SOLN
INTRAMUSCULAR | Status: DC | PRN
  Administered 2021-11-30: 50 ug via INTRAVENOUS
  Administered 2021-11-30 (×2): 100 ug via INTRAVENOUS
  Administered 2021-11-30: 250 ug via INTRAVENOUS

## 2021-11-30 MED ORDER — ROCURONIUM BROMIDE 10 MG/ML (PF) SYRINGE
PREFILLED_SYRINGE | INTRAVENOUS | Status: DC | PRN
  Administered 2021-11-30: 100 mg via INTRAVENOUS
  Administered 2021-11-30: 30 mg via INTRAVENOUS

## 2021-11-30 MED ORDER — MIDAZOLAM HCL 2 MG/2ML IJ SOLN
INTRAMUSCULAR | Status: AC
  Filled 2021-11-30: qty 2

## 2021-11-30 MED ORDER — LACTATED RINGERS IV SOLN: INTRAVENOUS | Status: DC | PRN

## 2021-11-30 MED ORDER — LIDOCAINE-EPINEPHRINE 1.5 %-1:200,000 OPTIME - NO CHARGE
INTRAMUSCULAR | Status: DC | PRN
  Administered 2021-11-30: 10 mL via SUBCUTANEOUS

## 2021-11-30 MED ORDER — DEXAMETHASONE SODIUM PHOSPHATE 10 MG/ML IJ SOLN
INTRAMUSCULAR | Status: AC
  Filled 2021-11-30: qty 1

## 2021-11-30 MED ORDER — PHENYLEPHRINE 80 MCG/ML (10ML) SYRINGE FOR IV PUSH (FOR BLOOD PRESSURE SUPPORT)
PREFILLED_SYRINGE | INTRAVENOUS | Status: AC
  Filled 2021-11-30: qty 10

## 2021-11-30 MED ORDER — CEFAZOLIN SODIUM-DEXTROSE 2-3 GM-%(50ML) IV SOLR
INTRAVENOUS | Status: DC | PRN
  Administered 2021-11-30: 2 g via INTRAVENOUS

## 2021-11-30 MED ORDER — PROSOURCE TF20 ENFIT COMPATIBL EN LIQD
60.0000 mL | Freq: Two times a day (BID) | ENTERAL | Status: DC
  Administered 2021-11-30 – 2021-12-12 (×24): 60 mL
  Filled 2021-11-30 (×25): qty 60

## 2021-11-30 MED ORDER — DEXAMETHASONE SODIUM PHOSPHATE 10 MG/ML IJ SOLN
INTRAMUSCULAR | Status: DC | PRN
  Administered 2021-11-30: 8 mg via INTRAVENOUS

## 2021-11-30 MED ORDER — ONDANSETRON HCL 4 MG/2ML IJ SOLN
INTRAMUSCULAR | Status: AC
  Filled 2021-11-30: qty 2

## 2021-11-30 SURGICAL SUPPLY — 40 items
BAG COUNTER SPONGE SURGICOUNT (BAG) ×2 IMPLANT
BAG SPNG CNTER NS LX DISP (BAG) ×2
BINDER ABDOMINAL 12 ML 46-62 (SOFTGOODS) IMPLANT
BLADE CLIPPER SURG (BLADE) IMPLANT
BLOCK BITE 60FR ADLT L/F BLUE (MISCELLANEOUS) ×2 IMPLANT
BUTTON OLYMPUS DEFENDO 5 PIECE (MISCELLANEOUS) ×2 IMPLANT
CANISTER SUCT 3000ML PPV (MISCELLANEOUS) ×2 IMPLANT
COVER SURGICAL LIGHT HANDLE (MISCELLANEOUS) ×2 IMPLANT
DRAPE UTILITY XL STRL (DRAPES) ×2 IMPLANT
ELECT CAUTERY BLADE 6.4 (BLADE) ×2 IMPLANT
ELECT REM PT RETURN 9FT ADLT (ELECTROSURGICAL) ×2
ELECTRODE REM PT RTRN 9FT ADLT (ELECTROSURGICAL) ×2 IMPLANT
GAUZE 4X4 16PLY ~~LOC~~+RFID DBL (SPONGE) ×2 IMPLANT
GLOVE BIO SURGEON STRL SZ8 (GLOVE) ×2 IMPLANT
GLOVE BIOGEL PI IND STRL 8 (GLOVE) ×2 IMPLANT
GOWN STRL REUS W/ TWL LRG LVL3 (GOWN DISPOSABLE) ×2 IMPLANT
GOWN STRL REUS W/ TWL XL LVL3 (GOWN DISPOSABLE) ×2 IMPLANT
GOWN STRL REUS W/TWL LRG LVL3 (GOWN DISPOSABLE) ×2
GOWN STRL REUS W/TWL XL LVL3 (GOWN DISPOSABLE) ×2
INTRODUCER TRACH BLUE RHINO 6F (TUBING) IMPLANT
INTRODUCER TRACH BLUE RHINO 8F (TUBING) IMPLANT
KIT BASIN OR (CUSTOM PROCEDURE TRAY) ×2 IMPLANT
KIT CLEAN ENDO COMPLIANCE (KITS) ×2 IMPLANT
KIT TURNOVER KIT B (KITS) ×2 IMPLANT
NS IRRIG 1000ML POUR BTL (IV SOLUTION) ×2 IMPLANT
PACK EENT II TURBAN DRAPE (CUSTOM PROCEDURE TRAY) ×2 IMPLANT
PAD ARMBOARD 7.5X6 YLW CONV (MISCELLANEOUS) ×4 IMPLANT
PENCIL BUTTON HOLSTER BLD 10FT (ELECTRODE) ×2 IMPLANT
SPONGE INTESTINAL PEANUT (DISPOSABLE) ×2 IMPLANT
SUT VICRYL AB 3 0 TIES (SUTURE) ×2 IMPLANT
SYR 20ML LL LF (SYRINGE) ×2 IMPLANT
TOWEL GREEN STERILE (TOWEL DISPOSABLE) ×2 IMPLANT
TOWEL GREEN STERILE FF (TOWEL DISPOSABLE) ×2 IMPLANT
TRAY TRACH DILATOR BL RHINO G2 (MISCELLANEOUS) IMPLANT
TUBE CONNECTING 12X1/4 (SUCTIONS) ×2 IMPLANT
TUBE ENDOVIVE SAFETY PEG 22FR (TUBING) IMPLANT
TUBE TRACH  6.0 CUFF FLEX (MISCELLANEOUS) ×2
TUBE TRACH 6.0 CUFF FLEX (MISCELLANEOUS) IMPLANT
TUBING ENDO SMARTCAP (MISCELLANEOUS) ×2 IMPLANT
WATER STERILE IRR 1000ML POUR (IV SOLUTION) ×2 IMPLANT

## 2021-11-30 NOTE — Plan of Care (Signed)
  Problem: Education: Goal: Knowledge of General Education information will improve Description: Including pain rating scale, medication(s)/side effects and non-pharmacologic comfort measures Outcome: Progressing   Problem: Clinical Measurements: Goal: Ability to maintain clinical measurements within normal limits will improve Outcome: Progressing   Problem: Activity: Goal: Risk for activity intolerance will decrease Outcome: Progressing   Problem: Nutrition: Goal: Adequate nutrition will be maintained Outcome: Progressing   Problem: Coping: Goal: Level of anxiety will decrease Outcome: Progressing   Problem: Skin Integrity: Goal: Risk for impaired skin integrity will decrease Outcome: Progressing   

## 2021-11-30 NOTE — Transfer of Care (Signed)
Immediate Anesthesia Transfer of Care Note  Patient: Bill Brooks.  Procedure(s) Performed: TRACHEOSTOMY (Neck) PERCUTANEOUS ENDOSCOPIC GASTROSTOMY (PEG) PLACEMENT (Abdomen)  Patient Location: ICU  Anesthesia Type:General  Level of Consciousness: sedated and unresponsive  Airway & Oxygen Therapy: Patient remains intubated per anesthesia plan and Patient placed on Ventilator (see vital sign flow sheet for setting)  Post-op Assessment: Report given to RN and Post -op Vital signs reviewed and stable  Post vital signs: Reviewed and stable  Last Vitals:  Vitals Value Taken Time  BP    Temp    Pulse 88 11/30/21 1049  Resp    SpO2 95 % 11/30/21 1049  Vitals shown include unvalidated device data.  Last Pain:  Vitals:   11/30/21 0800  TempSrc: Axillary  PainSc:          Complications: No notable events documented.

## 2021-11-30 NOTE — Plan of Care (Signed)
  Problem: Clinical Measurements: Goal: Ability to maintain clinical measurements within normal limits will improve Outcome: Progressing Goal: Cardiovascular complication will be avoided Outcome: Progressing   Problem: Nutrition: Goal: Adequate nutrition will be maintained Outcome: Progressing   Problem: Coping: Goal: Level of anxiety will decrease Outcome: Progressing   Problem: Safety: Goal: Ability to remain free from injury will improve Outcome: Progressing   Problem: Skin Integrity: Goal: Risk for impaired skin integrity will decrease Outcome: Progressing   Problem: Cardiovascular: Goal: Vascular access site(s) Level 0-1 will be maintained Outcome: Progressing   Problem: Coping: Goal: Ability to adjust to condition or change in health will improve Outcome: Progressing   Problem: Metabolic: Goal: Ability to maintain appropriate glucose levels will improve Outcome: Progressing   Problem: Nutritional: Goal: Maintenance of adequate nutrition will improve Outcome: Progressing   Problem: Skin Integrity: Goal: Risk for impaired skin integrity will decrease Outcome: Progressing   Problem: Tissue Perfusion: Goal: Adequacy of tissue perfusion will improve Outcome: Progressing

## 2021-11-30 NOTE — Op Note (Signed)
11/30/2021  10:34 AM  PATIENT:  Bill Brooks.  60 y.o. male  PRE-OPERATIVE DIAGNOSIS:  Prolonged Intubation  POST-OPERATIVE DIAGNOSIS:  Prolonged Intubation  PROCEDURE:  Procedure(s): TRACHEOSTOMY #6 SHILEY PERCUTANEOUS ENDOSCOPIC GASTROSTOMY (PEG) PLACEMENT  SURGEON:  Surgeon(s): Georganna Skeans, MD  ASSISTANTS: Richard Miu, PA-C   ANESTHESIA:   local and general  EBL:  No intake/output data recorded.  BLOOD ADMINISTERED:none  DRAINS: Gastrostomy Tube   SPECIMEN:  No Specimen  DISPOSITION OF SPECIMEN:  N/A  COUNTS:  YES  DICTATION: .Dragon Dictation Procedure in detail: Informed consent was obtained.  Patient was taken directly from the intensive care unit to the operating room on the ventilator.  He was appropriately positioned and his anterior neck was prepped and draped in a sterile fashion.  We did a timeout procedure.  Local was injected 1 cm cephalad to the sternal notch.  A vertical incision was made and subcutaneous tissues were dissected down through the platysma.  Identified the strap muscles and split along the midline.  Direct cleared away the anterior trachea and once I had good visualization and good hemostasis, anesthesia withdrew his endotracheal tube slightly until it was positioned to allow placement of the tracheostomy tube.  I placed an Angiocath between the second and third tracheal ring followed by a guidewire.  Next a small blue dilator was placed followed by the large Blue Rhino dilator.  Next, a #6 Shiley tracheostomy tube was inserted into the trachea over a smaller dilator.  It passed easily.  We inflated the cuff and the trach was hooked up to the circuit.  Excellent volume returns were obtained and his saturations remained 98 to 100%.  The endotracheal tube was removed from his mouth by anesthesia.  The tracheostomy was secured with 2-0 Prolene sutures in an interrupted fashion.  I also placed an interrupted 2-0 Prolene at the inferior portion of  the incision to close it down a little bit.  Velcro trach tie was applied.  A dressing sponge was placed.  There was excellent hemostasis.  He tolerated this portion of the procedure well and counts for this portion were all correct.  No complications.  Attention was directed to 24 FR PEG tube placement.  His abdomen was prepped and draped in a sterile fashion.  The EGD scope was inserted via the mouth down into the esophagus.  There were no gross esophageal lesions.  The scope was advanced into the stomach and a little bit of gastric secretions were suctioned out.  I advanced the scope down to the duodenal bulb and there were no complicating features noted.  The core track tube was removed by anesthesia.  We then insufflated the stomach and identified an excellent poke site via the anterior abdominal wall.  Local was injected at the site and a small incision was made.  The Angiocath was placed under direct vision into the stomach followed by a guidewire.  This guidewire was grasped with the endoscopic snare and brought out through his mouth.  The PEG tube was attached to the guidewire and the PEG tube was brought out through the abdominal wall.  The scope was reinserted into the stomach and confirmed excellent positioning of the PEG tube.  The flange was secured down to about 3 cm at the skin line.  Antibiotic ointment was placed at the site and there was good hemostasis.  He tolerated this procedure well.  No complications.  Abdominal binder will be placed.  The patient was taken directly back  from the operating room to the intensive care unit on the ventilator in critical but stable condition.   PATIENT DISPOSITION:  ICU - intubated and hemodynamically stable.   Delay start of Pharmacological VTE agent (>24hrs) due to surgical blood loss or risk of bleeding:  no  Georganna Skeans, MD, MPH, FACS Pager: [phone]  9/28/202310:34 AM
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
## 2021-11-30 NOTE — TOC Progression Note (Signed)
Transition of Care (TOC) - Progression Note    Patient Details  Name: Bill Brooks. MRN: [id number] Date of Birth: 13-May-1960  Transition of Care [facility]) CM/SW Contact  Ella Bodo, RN Phone Number: 11/30/2021, 4:08 PM  Clinical Narrative:    Incapacity letter requested by financial counseling to assist with Medicaid application.  Letter completed, signed by MD, and emailed to Caprice Renshaw with Financial Counseling.   TOC Case Manager continues to follow medical progress; note tracheostomy and PEG tube placed today.  Patient discussed in Multidisciplinary Trauma Rounds. .     Barriers to Discharge: Continued Medical Work up  Expected Discharge Plan and [place] arrangements for the past 2 months: Homeless                                       Social Determinants of Health ([place]) Interventions    Readmission Risk Interventions    01/11/2020   10:04 AM  Readmission Risk Prevention Plan  Post Dischage Appt Complete  Medication Screening Complete  Transportation Screening Complete   Reinaldo Raddle, RN, BSN  Trauma/Neuro ICU Case Manager [phone]

## 2021-12-01 ENCOUNTER — Encounter (HOSPITAL_COMMUNITY): Payer: Self-pay | Admitting: General Surgery

## 2021-12-01 ENCOUNTER — Inpatient Hospital Stay (HOSPITAL_COMMUNITY): Payer: Self-pay

## 2021-12-01 LAB — GLUCOSE, CAPILLARY
Glucose-Capillary: 108 mg/dL — ABNORMAL HIGH (ref 70–99)
Glucose-Capillary: 110 mg/dL — ABNORMAL HIGH (ref 70–99)
Glucose-Capillary: 133 mg/dL — ABNORMAL HIGH (ref 70–99)
Glucose-Capillary: 145 mg/dL — ABNORMAL HIGH (ref 70–99)
Glucose-Capillary: 162 mg/dL — ABNORMAL HIGH (ref 70–99)
Glucose-Capillary: 164 mg/dL — ABNORMAL HIGH (ref 70–99)
Glucose-Capillary: 90 mg/dL (ref 70–99)

## 2021-12-01 LAB — BASIC METABOLIC PANEL
Anion gap: 10 (ref 5–15)
BUN: 38 mg/dL — ABNORMAL HIGH (ref 8–23)
CO2: 25 mmol/L (ref 22–32)
Calcium: 8.4 mg/dL — ABNORMAL LOW (ref 8.9–10.3)
Chloride: 106 mmol/L (ref 98–111)
Creatinine, Ser: 1.09 mg/dL (ref 0.61–1.24)
GFR, Estimated: >60 mL/min (ref >60)
Glucose, Bld: 147 mg/dL — ABNORMAL HIGH (ref 70–99)
Potassium: 4 mmol/L (ref 3.5–5.1)
Sodium: 141 mmol/L (ref 135–145)

## 2021-12-01 LAB — CBC
HCT: 26.1 % — ABNORMAL LOW (ref 39.0–52.0)
Hemoglobin: 8.5 g/dL — ABNORMAL LOW (ref 13.0–17.0)
MCH: 27.7 pg (ref 26.0–34.0)
MCHC: 32.6 g/dL (ref 30.0–36.0)
MCV: 85 fL (ref 80.0–100.0)
Platelets: 654 10*3/uL — ABNORMAL HIGH (ref 150–400)
RBC: 3.07 MIL/uL — ABNORMAL LOW (ref 4.22–5.81)
RDW: 15.4 % (ref 11.5–15.5)
WBC: 11.9 10*3/uL — ABNORMAL HIGH (ref 4.0–10.5)
nRBC: 0 % (ref 0.0–0.2)

## 2021-12-01 LAB — TRIGLYCERIDES: Triglycerides: 216 mg/dL — ABNORMAL HIGH (ref <150)

## 2021-12-01 MED ORDER — GUAIFENESIN 100 MG/5ML PO LIQD
15.0000 mL | Freq: Four times a day (QID) | ORAL | Status: DC
  Administered 2021-12-01 – 2021-12-14 (×49): 15 mL
  Filled 2021-12-01 (×54): qty 15

## 2021-12-01 MED ORDER — POLYETHYLENE GLYCOL 3350 17 G PO PACK
17.0000 g | PACK | Freq: Every day | ORAL | Status: DC
  Administered 2021-12-02 – 2021-12-07 (×5): 17 g
  Filled 2021-12-01 (×6): qty 1

## 2021-12-01 MED ORDER — CLONAZEPAM 0.5 MG PO TABS
0.5000 mg | ORAL_TABLET | Freq: Two times a day (BID) | ORAL | Status: DC
  Administered 2021-12-01 – 2021-12-04 (×8): 0.5 mg
  Filled 2021-12-01 (×8): qty 1

## 2021-12-01 MED ORDER — QUETIAPINE FUMARATE 200 MG PO TABS
200.0000 mg | ORAL_TABLET | Freq: Two times a day (BID) | ORAL | Status: DC
  Administered 2021-12-01 – 2021-12-07 (×12): 200 mg
  Filled 2021-12-01 (×12): qty 1

## 2021-12-01 MED ORDER — POLYETHYLENE GLYCOL 3350 17 G PO PACK: 17.0000 g | PACK | Freq: Every day | ORAL | Status: DC
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
## 2021-12-01 NOTE — Anesthesia Postprocedure Evaluation (Signed)
Anesthesia Post Note  Patient: Geordan Xu.  Procedure(s) Performed: TRACHEOSTOMY (Neck) PERCUTANEOUS ENDOSCOPIC GASTROSTOMY (PEG) PLACEMENT (Abdomen)     Patient location during evaluation: ICU Anesthesia Type: General Level of consciousness: patient remains intubated per anesthesia plan Pain management: pain level controlled Vital Signs Assessment: post-procedure vital signs reviewed and stable Respiratory status: patient remains intubated per anesthesia plan and patient on ventilator - see flowsheet for VS Cardiovascular status: stable and blood pressure returned to baseline Postop Assessment: no apparent nausea or vomiting Anesthetic complications: no   No notable events documented.  Last Vitals:  Vitals:   12/01/21 0730 12/01/21 0800  BP:    Pulse: 98   Resp: (!) 28   Temp:    SpO2: 100% 100%    Last Pain:  Vitals:   12/01/21 0400  TempSrc: Axillary  PainSc:                  Pervis Hocking

## 2021-12-01 NOTE — Progress Notes (Signed)
Notified by respiratory that patient was extremely agitated suddenly, pulling at vent and gown, attempting to get out of bed. Patient sweating profusely and attempting to yell but speech incomprehensible. Patient unable to calm down. Administered prn versed, and increased rate of fentanyl and propofol. Notified Dr. Grandville Silos for order of bilateral wrist restraints to ensure patient safety.

## 2021-12-02 LAB — BASIC METABOLIC PANEL
Anion gap: 11 (ref 5–15)
BUN: 35 mg/dL — ABNORMAL HIGH (ref 8–23)
CO2: 25 mmol/L (ref 22–32)
Calcium: 8.8 mg/dL — ABNORMAL LOW (ref 8.9–10.3)
Chloride: 105 mmol/L (ref 98–111)
Creatinine, Ser: 0.93 mg/dL (ref 0.61–1.24)
GFR, Estimated: >60 mL/min (ref >60)
Glucose, Bld: 117 mg/dL — ABNORMAL HIGH (ref 70–99)
Potassium: 4.4 mmol/L (ref 3.5–5.1)
Sodium: 141 mmol/L (ref 135–145)

## 2021-12-02 LAB — GLUCOSE, CAPILLARY
Glucose-Capillary: 101 mg/dL — ABNORMAL HIGH (ref 70–99)
Glucose-Capillary: 107 mg/dL — ABNORMAL HIGH (ref 70–99)
Glucose-Capillary: 110 mg/dL — ABNORMAL HIGH (ref 70–99)
Glucose-Capillary: 121 mg/dL — ABNORMAL HIGH (ref 70–99)
Glucose-Capillary: 127 mg/dL — ABNORMAL HIGH (ref 70–99)
Glucose-Capillary: 129 mg/dL — ABNORMAL HIGH (ref 70–99)

## 2021-12-02 LAB — CBC
HCT: 26.1 % — ABNORMAL LOW (ref 39.0–52.0)
Hemoglobin: 8.3 g/dL — ABNORMAL LOW (ref 13.0–17.0)
MCH: 28 pg (ref 26.0–34.0)
MCHC: 31.8 g/dL (ref 30.0–36.0)
MCV: 88.2 fL (ref 80.0–100.0)
Platelets: 536 10*3/uL — ABNORMAL HIGH (ref 150–400)
RBC: 2.96 MIL/uL — ABNORMAL LOW (ref 4.22–5.81)
RDW: 15.8 % — ABNORMAL HIGH (ref 11.5–15.5)
WBC: 9.9 10*3/uL (ref 4.0–10.5)
nRBC: 0 % (ref 0.0–0.2)
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
## 2021-12-03 ENCOUNTER — Inpatient Hospital Stay: Payer: Self-pay

## 2021-12-03 LAB — GLUCOSE, CAPILLARY
Glucose-Capillary: 124 mg/dL — ABNORMAL HIGH (ref 70–99)
Glucose-Capillary: 123 mg/dL — ABNORMAL HIGH (ref 70–99)
Glucose-Capillary: 105 mg/dL — ABNORMAL HIGH (ref 70–99)
Glucose-Capillary: 106 mg/dL — ABNORMAL HIGH (ref 70–99)
Glucose-Capillary: 122 mg/dL — ABNORMAL HIGH (ref 70–99)
Glucose-Capillary: 116 mg/dL — ABNORMAL HIGH (ref 70–99)

## 2021-12-03 LAB — CBC
HCT: 24.4 % — ABNORMAL LOW (ref 39.0–52.0)
Hemoglobin: 7.6 g/dL — ABNORMAL LOW (ref 13.0–17.0)
MCH: 27.5 pg (ref 26.0–34.0)
MCHC: 31.1 g/dL (ref 30.0–36.0)
MCV: 88.4 fL (ref 80.0–100.0)
Platelets: 535 10*3/uL — ABNORMAL HIGH (ref 150–400)
RBC: 2.76 MIL/uL — ABNORMAL LOW (ref 4.22–5.81)
RDW: 15.6 % — ABNORMAL HIGH (ref 11.5–15.5)
WBC: 8.5 10*3/uL (ref 4.0–10.5)
nRBC: 0 % (ref 0.0–0.2)

## 2021-12-03 LAB — BASIC METABOLIC PANEL
Anion gap: 6 (ref 5–15)
BUN: 35 mg/dL — ABNORMAL HIGH (ref 8–23)
CO2: 23 mmol/L (ref 22–32)
Calcium: 8.5 mg/dL — ABNORMAL LOW (ref 8.9–10.3)
Chloride: 106 mmol/L (ref 98–111)
Creatinine, Ser: 0.95 mg/dL (ref 0.61–1.24)
GFR, Estimated: >60 mL/min (ref >60)
Glucose, Bld: 126 mg/dL — ABNORMAL HIGH (ref 70–99)
Potassium: 3.9 mmol/L (ref 3.5–5.1)
Sodium: 135 mmol/L (ref 135–145)

## 2021-12-03 MED ORDER — SODIUM CHLORIDE 0.9% FLUSH
10.0000 mL | Freq: Two times a day (BID) | INTRAVENOUS | Status: DC
  Administered 2021-12-03 – 2021-12-04 (×3): 10 mL
  Administered 2021-12-04: 20 mL
  Administered 2021-12-05: 10 mL
  Administered 2021-12-05 – 2021-12-06 (×2): 30 mL
  Administered 2021-12-07 – 2022-01-09 (×59): 10 mL
  Administered 2022-01-10: 20 mL
  Administered 2022-01-10 – 2022-01-23 (×24): 10 mL
  Administered 2022-01-23: 20 mL
  Administered 2022-01-24 – 2022-02-13 (×30): 10 mL

## 2021-12-03 MED ORDER — SODIUM CHLORIDE 0.9% FLUSH
10.0000 mL | INTRAVENOUS | Status: DC | PRN
  Administered 2021-12-12 – 2022-01-10 (×4): 10 mL

## 2021-12-03 MED ORDER — FUROSEMIDE 10 MG/ML IJ SOLN
40.0000 mg | Freq: Once | INTRAMUSCULAR | Status: AC
  Administered 2021-12-03: 40 mg via INTRAVENOUS
  Filled 2021-12-03: qty 4

## 2021-12-03 NOTE — Progress Notes (Signed)
Peripherally Inserted Central Catheter Placement  The IV Nurse has discussed with the patient and/or persons authorized to consent for the patient, the purpose of this procedure and the potential benefits and risks involved with this procedure.  The benefits include less needle sticks, lab draws from the catheter, and the patient may be discharged home with the catheter. Risks include, but not limited to, infection, bleeding, blood clot (thrombus formation), and puncture of an artery; nerve damage and irregular heartbeat and possibility to perform a PICC exchange if needed/ordered by physician.  Alternatives to this procedure were also discussed.  Bard Power PICC patient education guide, fact sheet on infection prevention and patient information card has been provided to patient /or left at bedside.    PICC Placement Documentation  PICC Double Lumen 12/03/21 Left Brachial 47 cm 0 cm (Active)  Indication for Insertion or Continuance of Line Prolonged intravenous therapies 12/03/21 1204  Exposed Catheter (cm) 0 cm 12/03/21 1204  Site Assessment Clean, Dry, Intact 12/03/21 1204  Lumen #1 Status Saline locked;Flushed;Blood return noted 12/03/21 1204  Lumen #2 Status Saline locked;Flushed;Blood return noted 12/03/21 1204  Dressing Type Transparent;Securing device 12/03/21 1204  Dressing Status Antimicrobial disc in place;Clean, Dry, Intact 12/03/21 1204  Safety Lock Not Applicable 19/41/74 0814  Line Care Connections checked and tightened 12/03/21 1204  Line Adjustment (NICU/IV Team Only) No 12/03/21 1204  Dressing Intervention New dressing 12/03/21 4818  Dressing Change Due 12/10/21 12/03/21 [place] 12/03/2021, 12:06 PM
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
## 2021-12-03 NOTE — Progress Notes (Signed)
Telephone consent obtained from sister and brother for PICC placement.

## 2021-12-04 LAB — GLUCOSE, CAPILLARY
Glucose-Capillary: 105 mg/dL — ABNORMAL HIGH (ref 70–99)
Glucose-Capillary: 114 mg/dL — ABNORMAL HIGH (ref 70–99)
Glucose-Capillary: 120 mg/dL — ABNORMAL HIGH (ref 70–99)
Glucose-Capillary: 125 mg/dL — ABNORMAL HIGH (ref 70–99)
Glucose-Capillary: 127 mg/dL — ABNORMAL HIGH (ref 70–99)
Glucose-Capillary: 127 mg/dL — ABNORMAL HIGH (ref 70–99)

## 2021-12-04 LAB — BASIC METABOLIC PANEL
Anion gap: 10 (ref 5–15)
BUN: 33 mg/dL — ABNORMAL HIGH (ref 8–23)
CO2: 26 mmol/L (ref 22–32)
Calcium: 8.5 mg/dL — ABNORMAL LOW (ref 8.9–10.3)
Chloride: 100 mmol/L (ref 98–111)
Creatinine, Ser: 0.95 mg/dL (ref 0.61–1.24)
GFR, Estimated: >60 mL/min (ref >60)
Glucose, Bld: 127 mg/dL — ABNORMAL HIGH (ref 70–99)
Potassium: 4.1 mmol/L (ref 3.5–5.1)
Sodium: 136 mmol/L (ref 135–145)

## 2021-12-04 LAB — CBC
HCT: 25.2 % — ABNORMAL LOW (ref 39.0–52.0)
Hemoglobin: 8.1 g/dL — ABNORMAL LOW (ref 13.0–17.0)
MCH: 27.9 pg (ref 26.0–34.0)
MCHC: 32.1 g/dL (ref 30.0–36.0)
MCV: 86.9 fL (ref 80.0–100.0)
Platelets: 555 10*3/uL — ABNORMAL HIGH (ref 150–400)
RBC: 2.9 MIL/uL — ABNORMAL LOW (ref 4.22–5.81)
RDW: 15.5 % (ref 11.5–15.5)
WBC: 12.1 10*3/uL — ABNORMAL HIGH (ref 4.0–10.5)
nRBC: 0 % (ref 0.0–0.2)

## 2021-12-04 LAB — TRIGLYCERIDES: Triglycerides: 404 mg/dL — ABNORMAL HIGH (ref <150)

## 2021-12-04 MED ORDER — OSMOLITE 1.5 CAL PO LIQD
1000.0000 mL | ORAL | Status: DC
  Administered 2021-12-04 – 2021-12-12 (×4): 1000 mL
  Filled 2021-12-04: qty 1000

## 2021-12-04 MED ORDER — PROCHLORPERAZINE EDISYLATE 10 MG/2ML IJ SOLN
10.0000 mg | Freq: Four times a day (QID) | INTRAMUSCULAR | Status: DC | PRN
  Administered 2021-12-05 – 2021-12-06 (×2): 10 mg via INTRAVENOUS
  Filled 2021-12-04 (×2): qty 2

## 2021-12-04 MED ORDER — GLYCOPYRROLATE 0.2 MG/ML IJ SOLN
0.2000 mg | Freq: Three times a day (TID) | INTRAMUSCULAR | Status: DC
  Administered 2021-12-04 – 2022-01-08 (×106): 0.2 mg via INTRAVENOUS
  Filled 2021-12-04 (×107): qty 1
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
## 2021-12-05 LAB — CBC
HCT: 23.3 % — ABNORMAL LOW (ref 39.0–52.0)
Hemoglobin: 7.6 g/dL — ABNORMAL LOW (ref 13.0–17.0)
MCH: 28.4 pg (ref 26.0–34.0)
MCHC: 32.6 g/dL (ref 30.0–36.0)
MCV: 86.9 fL (ref 80.0–100.0)
Platelets: 509 10*3/uL — ABNORMAL HIGH (ref 150–400)
RBC: 2.68 MIL/uL — ABNORMAL LOW (ref 4.22–5.81)
RDW: 15.6 % — ABNORMAL HIGH (ref 11.5–15.5)
WBC: 10.6 10*3/uL — ABNORMAL HIGH (ref 4.0–10.5)
nRBC: 0 % (ref 0.0–0.2)

## 2021-12-05 LAB — GLUCOSE, CAPILLARY
Glucose-Capillary: 125 mg/dL — ABNORMAL HIGH (ref 70–99)
Glucose-Capillary: 104 mg/dL — ABNORMAL HIGH (ref 70–99)
Glucose-Capillary: 148 mg/dL — ABNORMAL HIGH (ref 70–99)
Glucose-Capillary: 64 mg/dL — ABNORMAL LOW (ref 70–99)
Glucose-Capillary: 101 mg/dL — ABNORMAL HIGH (ref 70–99)
Glucose-Capillary: 120 mg/dL — ABNORMAL HIGH (ref 70–99)

## 2021-12-05 LAB — BASIC METABOLIC PANEL
Anion gap: 8 (ref 5–15)
BUN: 34 mg/dL — ABNORMAL HIGH (ref 8–23)
CO2: 26 mmol/L (ref 22–32)
Calcium: 8.2 mg/dL — ABNORMAL LOW (ref 8.9–10.3)
Chloride: 100 mmol/L (ref 98–111)
Creatinine, Ser: 0.98 mg/dL (ref 0.61–1.24)
GFR, Estimated: >60 mL/min (ref >60)
Glucose, Bld: 119 mg/dL — ABNORMAL HIGH (ref 70–99)
Potassium: 4.3 mmol/L (ref 3.5–5.1)
Sodium: 134 mmol/L — ABNORMAL LOW (ref 135–145)

## 2021-12-05 LAB — TRIGLYCERIDES: Triglycerides: 600 mg/dL — ABNORMAL HIGH (ref <150)

## 2021-12-05 MED ORDER — FREE WATER
200.0000 mL | Freq: Three times a day (TID) | Status: DC
  Administered 2021-12-05 – 2021-12-12 (×13): 200 mL

## 2021-12-05 MED ORDER — DEXTROSE 50 % IV SOLN
INTRAVENOUS | Status: AC
  Filled 2021-12-05: qty 50

## 2021-12-05 MED ORDER — DEXTROSE 50 % IV SOLN
12.5000 g | INTRAVENOUS | Status: AC
  Administered 2021-12-05: 12.5 g via INTRAVENOUS

## 2021-12-05 MED ORDER — CLONAZEPAM 1 MG PO TABS
1.0000 mg | ORAL_TABLET | Freq: Two times a day (BID) | ORAL | Status: DC
  Administered 2021-12-05 – 2021-12-07 (×5): 1 mg
  Filled 2021-12-05 (×5): qty 1
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
## 2021-12-05 NOTE — Progress Notes (Signed)
Trauma Event Note   TRN to bedside to round. Intubated/sedated with propofol and fentanyl. Primary RN Glennon Mac at bedside, no needs for me at this time.   Last imported Vital Signs BP 115/84 (BP Location: Left Arm)   Pulse 84   Temp (!) 100.4 F (38 C) (Axillary)   Resp (!) 23   Ht 6\' 1"  (1.854 m)   Wt 190 lb 0.6 oz (86.2 kg)   SpO2 99%   BMI 25.07 kg/m   Trending CBC Recent Labs    12/02/21 0341 12/03/21 0734 12/04/21 0410  WBC 9.9 8.5 12.1*  HGB 8.3* 7.6* 8.1*  HCT 26.1* 24.4* 25.2*  PLT 536* 535* 555*    Trending Coag's No results for input(s): "APTT", "INR" in the last 72 hours.  Trending BMET Recent Labs    12/02/21 0341 12/03/21 0550 12/04/21 0410  NA 141 135 136  K 4.4 3.9 4.1  CL 105 106 100  CO2 25 23 26   BUN 35* 35* 33*  CREATININE 0.93 0.95 0.95  GLUCOSE 117* 126* 127*      Charlcie Prisco O Ilyssa Grennan  Trauma Response RN  Please call TRN at [phone] for further assistance.

## 2021-12-06 ENCOUNTER — Inpatient Hospital Stay (HOSPITAL_COMMUNITY): Payer: Self-pay

## 2021-12-06 LAB — CBC
HCT: 23.4 % — ABNORMAL LOW (ref 39.0–52.0)
Hemoglobin: 7.4 g/dL — ABNORMAL LOW (ref 13.0–17.0)
MCH: 27.5 pg (ref 26.0–34.0)
MCHC: 31.6 g/dL (ref 30.0–36.0)
MCV: 87 fL (ref 80.0–100.0)
Platelets: 484 10*3/uL — ABNORMAL HIGH (ref 150–400)
RBC: 2.69 MIL/uL — ABNORMAL LOW (ref 4.22–5.81)
RDW: 15.3 % (ref 11.5–15.5)
WBC: 10 10*3/uL (ref 4.0–10.5)
nRBC: 0 % (ref 0.0–0.2)

## 2021-12-06 LAB — BASIC METABOLIC PANEL
Anion gap: 12 (ref 5–15)
BUN: 31 mg/dL — ABNORMAL HIGH (ref 8–23)
CO2: 25 mmol/L (ref 22–32)
Calcium: 8.6 mg/dL — ABNORMAL LOW (ref 8.9–10.3)
Chloride: 99 mmol/L (ref 98–111)
Creatinine, Ser: 1.06 mg/dL (ref 0.61–1.24)
GFR, Estimated: >60 mL/min (ref >60)
Glucose, Bld: 120 mg/dL — ABNORMAL HIGH (ref 70–99)
Potassium: 4.2 mmol/L (ref 3.5–5.1)
Sodium: 136 mmol/L (ref 135–145)

## 2021-12-06 LAB — GLUCOSE, CAPILLARY
Glucose-Capillary: 100 mg/dL — ABNORMAL HIGH (ref 70–99)
Glucose-Capillary: 117 mg/dL — ABNORMAL HIGH (ref 70–99)
Glucose-Capillary: 85 mg/dL (ref 70–99)
Glucose-Capillary: 86 mg/dL (ref 70–99)
Glucose-Capillary: 89 mg/dL (ref 70–99)
Glucose-Capillary: 95 mg/dL (ref 70–99)
Glucose-Capillary: 96 mg/dL (ref 70–99)
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
## 2021-12-06 NOTE — Progress Notes (Signed)
2213 patient had episode of dry heaving, PRN zofran was given.  0015 patient had repeat episode of nausea w/ vomiting. PRN Compazine given for refractory N/V and TF turned off. Trauma MD aware, no new orders. TF remain off at this time.

## 2021-12-07 LAB — CBC
HCT: 23.2 % — ABNORMAL LOW (ref 39.0–52.0)
Hemoglobin: 7.4 g/dL — ABNORMAL LOW (ref 13.0–17.0)
MCH: 27.9 pg (ref 26.0–34.0)
MCHC: 31.9 g/dL (ref 30.0–36.0)
MCV: 87.5 fL (ref 80.0–100.0)
Platelets: 454 10*3/uL — ABNORMAL HIGH (ref 150–400)
RBC: 2.65 MIL/uL — ABNORMAL LOW (ref 4.22–5.81)
RDW: 15.2 % (ref 11.5–15.5)
WBC: 7.4 10*3/uL (ref 4.0–10.5)
nRBC: 0 % (ref 0.0–0.2)

## 2021-12-07 LAB — BASIC METABOLIC PANEL
Anion gap: 7 (ref 5–15)
BUN: 28 mg/dL — ABNORMAL HIGH (ref 8–23)
CO2: 22 mmol/L (ref 22–32)
Calcium: 7.7 mg/dL — ABNORMAL LOW (ref 8.9–10.3)
Chloride: 105 mmol/L (ref 98–111)
Creatinine, Ser: 0.92 mg/dL (ref 0.61–1.24)
GFR, Estimated: >60 mL/min (ref >60)
Glucose, Bld: 82 mg/dL (ref 70–99)
Potassium: 3.7 mmol/L (ref 3.5–5.1)
Sodium: 134 mmol/L — ABNORMAL LOW (ref 135–145)

## 2021-12-07 LAB — GLUCOSE, CAPILLARY
Glucose-Capillary: 108 mg/dL — ABNORMAL HIGH (ref 70–99)
Glucose-Capillary: 115 mg/dL — ABNORMAL HIGH (ref 70–99)
Glucose-Capillary: 118 mg/dL — ABNORMAL HIGH (ref 70–99)
Glucose-Capillary: 85 mg/dL (ref 70–99)
Glucose-Capillary: 86 mg/dL (ref 70–99)
Glucose-Capillary: 92 mg/dL (ref 70–99)

## 2021-12-07 MED ORDER — HYDROMORPHONE HCL 1 MG/ML IJ SOLN
0.5000 mg | INTRAMUSCULAR | Status: DC | PRN
  Administered 2021-12-07: 0.5 mg via INTRAVENOUS
  Filled 2021-12-07: qty 1

## 2021-12-07 MED ORDER — CLONAZEPAM 0.5 MG PO TABS
0.5000 mg | ORAL_TABLET | Freq: Two times a day (BID) | ORAL | Status: DC
  Administered 2021-12-07 – 2021-12-12 (×10): 0.5 mg
  Filled 2021-12-07 (×10): qty 1

## 2021-12-07 MED ORDER — MAGNESIUM CITRATE PO SOLN
1.0000 | Freq: Once | ORAL | Status: AC
  Administered 2021-12-07: 1
  Filled 2021-12-07: qty 296

## 2021-12-07 MED ORDER — BISACODYL 5 MG PO TBEC: 10.0000 mg | DELAYED_RELEASE_TABLET | Freq: Once | ORAL | Status: DC

## 2021-12-07 MED ORDER — MAGNESIUM HYDROXIDE 400 MG/5ML PO SUSP
30.0000 mL | Freq: Once | ORAL | Status: AC
  Administered 2021-12-07: 30 mL
  Filled 2021-12-07: qty 30

## 2021-12-07 MED ORDER — POLYETHYLENE GLYCOL 3350 17 G PO PACK: 17.0000 g | PACK | Freq: Two times a day (BID) | ORAL | Status: DC

## 2021-12-07 MED ORDER — SENNA 8.6 MG PO TABS
2.0000 | ORAL_TABLET | Freq: Every day | ORAL | Status: DC
  Administered 2021-12-07: 17.2 mg
  Filled 2021-12-07: qty 2

## 2021-12-07 MED ORDER — BISACODYL 10 MG RE SUPP
10.0000 mg | Freq: Once | RECTAL | Status: AC
  Administered 2021-12-07: 10 mg via RECTAL
  Filled 2021-12-07: qty 1

## 2021-12-07 MED ORDER — HYDROMORPHONE HCL 1 MG/ML IJ SOLN
0.5000 mg | INTRAMUSCULAR | Status: DC | PRN
  Administered 2021-12-07 (×2): 0.5 mg via INTRAVENOUS
  Filled 2021-12-07 (×2): qty 1

## 2021-12-07 MED ORDER — QUETIAPINE FUMARATE 50 MG PO TABS
100.0000 mg | ORAL_TABLET | Freq: Every morning | ORAL | Status: DC
  Administered 2021-12-08 – 2021-12-14 (×6): 100 mg
  Filled 2021-12-07: qty 2
  Filled 2021-12-07: qty 1
  Filled 2021-12-07: qty 2
  Filled 2021-12-07 (×3): qty 1

## 2021-12-07 MED ORDER — QUETIAPINE FUMARATE 200 MG PO TABS: 200.0000 mg | ORAL_TABLET | Freq: Every day | ORAL | Status: DC

## 2021-12-07 NOTE — Progress Notes (Signed)
RT called to pts room to assess pts breathing. Pt was found to have blown trach cuff. MD came to bedside to change trach out. No complications noted.
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
## 2021-12-07 NOTE — Progress Notes (Signed)
Pt placed back on full vent support due to increased WOB with RR in the 40's and sats dropping into the 80's. RN aware.
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
## 2021-12-07 NOTE — Progress Notes (Addendum)
I was notified that patient had persistently low tidal volumes after being placed back on the vent, and there was concern for a blown cuff. Patient's existing trach was exchanged for a new cuffed Shiley #6. Color change confirmed on EtCO2 detector. Tidal volumes improved after exchange and inflation of the cuff, with O2 sats 98-100%.

## 2021-12-07 NOTE — Progress Notes (Signed)
Pt placed on 40% ATC and is tolerating well at this time. RT to continue to monitor. 

## 2021-12-08 ENCOUNTER — Inpatient Hospital Stay (HOSPITAL_COMMUNITY): Payer: Self-pay

## 2021-12-08 HISTORY — PX: IR GASTR TUBE CONVERT GASTR-JEJ PER W/FL MOD SED: IMG2332

## 2021-12-08 LAB — CBC
HCT: 25.2 % — ABNORMAL LOW (ref 39.0–52.0)
Hemoglobin: 8.2 g/dL — ABNORMAL LOW (ref 13.0–17.0)
MCH: 27.6 pg (ref 26.0–34.0)
MCHC: 32.5 g/dL (ref 30.0–36.0)
MCV: 84.8 fL (ref 80.0–100.0)
Platelets: 507 10*3/uL — ABNORMAL HIGH (ref 150–400)
RBC: 2.97 MIL/uL — ABNORMAL LOW (ref 4.22–5.81)
RDW: 14.6 % (ref 11.5–15.5)
WBC: 10.1 10*3/uL (ref 4.0–10.5)
nRBC: 0 % (ref 0.0–0.2)

## 2021-12-08 LAB — BASIC METABOLIC PANEL
Anion gap: 12 (ref 5–15)
BUN: 28 mg/dL — ABNORMAL HIGH (ref 8–23)
CO2: 24 mmol/L (ref 22–32)
Calcium: 8.7 mg/dL — ABNORMAL LOW (ref 8.9–10.3)
Chloride: 99 mmol/L (ref 98–111)
Creatinine, Ser: 1.1 mg/dL (ref 0.61–1.24)
GFR, Estimated: >60 mL/min (ref >60)
Glucose, Bld: 102 mg/dL — ABNORMAL HIGH (ref 70–99)
Potassium: 4 mmol/L (ref 3.5–5.1)
Sodium: 135 mmol/L (ref 135–145)

## 2021-12-08 LAB — GLUCOSE, CAPILLARY
Glucose-Capillary: 97 mg/dL (ref 70–99)
Glucose-Capillary: 93 mg/dL (ref 70–99)
Glucose-Capillary: 114 mg/dL — ABNORMAL HIGH (ref 70–99)
Glucose-Capillary: 99 mg/dL (ref 70–99)
Glucose-Capillary: 106 mg/dL — ABNORMAL HIGH (ref 70–99)
Glucose-Capillary: 109 mg/dL — ABNORMAL HIGH (ref 70–99)

## 2021-12-08 LAB — OCCULT BLOOD X 1 CARD TO LAB, STOOL: Fecal Occult Bld: POSITIVE — AB

## 2021-12-08 MED ORDER — DOCUSATE SODIUM 50 MG/5ML PO LIQD: 100.0000 mg | Freq: Two times a day (BID) | ORAL | Status: DC | PRN

## 2021-12-08 MED ORDER — IOHEXOL 300 MG/ML  SOLN
50.0000 mL | Freq: Once | INTRAMUSCULAR | Status: AC | PRN
  Administered 2021-12-08: 30 mL

## 2021-12-08 MED ORDER — SENNA 8.6 MG PO TABS: 2.0000 | ORAL_TABLET | Freq: Every day | ORAL | Status: DC | PRN

## 2021-12-08 MED ORDER — LIDOCAINE VISCOUS HCL 2 % MT SOLN
OROMUCOSAL | Status: AC
  Filled 2021-12-08: qty 15

## 2021-12-08 MED ORDER — MIDAZOLAM HCL 2 MG/2ML IJ SOLN
INTRAMUSCULAR | Status: AC | PRN
  Administered 2021-12-08 (×2): 1 mg via INTRAVENOUS

## 2021-12-08 MED ORDER — HYDROMORPHONE HCL 1 MG/ML IJ SOLN
1.0000 mg | INTRAMUSCULAR | Status: DC | PRN
  Administered 2021-12-08 – 2021-12-18 (×21): 1 mg via INTRAVENOUS
  Filled 2021-12-08 (×21): qty 1

## 2021-12-08 MED ORDER — POLYETHYLENE GLYCOL 3350 17 G PO PACK: 17.0000 g | PACK | Freq: Every day | ORAL | Status: DC | PRN

## 2021-12-08 MED ORDER — MIDAZOLAM HCL 2 MG/2ML IJ SOLN
INTRAMUSCULAR | Status: AC
  Filled 2021-12-08: qty 2

## 2021-12-08 MED ORDER — QUETIAPINE FUMARATE 50 MG PO TABS
300.0000 mg | ORAL_TABLET | Freq: Every day | ORAL | Status: DC
  Administered 2021-12-08 – 2021-12-13 (×5): 300 mg
  Filled 2021-12-08 (×2): qty 6
  Filled 2021-12-08 (×2): qty 1
  Filled 2021-12-08: qty 6
  Filled 2021-12-08: qty 1

## 2021-12-08 NOTE — Sedation Documentation (Signed)
Vital signs stable. 

## 2021-12-08 NOTE — Progress Notes (Signed)
Received request to evaluate 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 Mr Bill Brooks's G-tube was placed 8 days ago and he has not tolerated tube feeds.  He is having vomiting episodes with feeding trials. IR can attempt to place J-limb through existing G-tube for use to bypass stomach.  Conversation had with sister, Bill Brooks, who gives verbal consent.   Will send for as schedule and staffing allows.  Electronically Signed: Pasty Spillers, PA-C 12/08/2021, 12:02 PM
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
## 2021-12-08 NOTE — Sedation Documentation (Signed)
Pt tolerating procedure well. VSS. Procedure continues
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
## 2021-12-08 NOTE — Progress Notes (Signed)
Pt G tube exchanged for G/J tube. Trauma MD notified when procedure complete. Per MD ok to use tube use J for all meds and TF. RN holding TF at this time d/t episode of emesis.

## 2021-12-08 NOTE — Sedation Documentation (Signed)
Pt tolerated procedure well. Vitals stable, report given to 4N RNs at bedside.

## 2021-12-08 NOTE — Progress Notes (Signed)
Pt with large, loose stool that saturated foley.  RN reached out to MD, Dr. Zenia Resides, for flexi.  VO to insert flexi.  Pt had also vomited.  Zofran given and pt suctioned.  Tube feeds were already held from the day.  MD notified.  VO to put PEG tube to gravity.  Also discussed with MD pt's agitation despite giving PRNs regularly.  MD will review pt's meds and adjust accordingly.

## 2021-12-08 NOTE — Progress Notes (Signed)
Pt placed on 40% ATC and is tolerating well at this time. RN at bedside. RT to continue to monitor. 
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
## 2021-12-08 NOTE — Sedation Documentation (Signed)
Pt restless, VSS

## 2021-12-09 LAB — CBC
HCT: 27.3 % — ABNORMAL LOW (ref 39.0–52.0)
Hemoglobin: 8.6 g/dL — ABNORMAL LOW (ref 13.0–17.0)
MCH: 27.6 pg (ref 26.0–34.0)
MCHC: 31.5 g/dL (ref 30.0–36.0)
MCV: 87.5 fL (ref 80.0–100.0)
Platelets: 528 10*3/uL — ABNORMAL HIGH (ref 150–400)
RBC: 3.12 MIL/uL — ABNORMAL LOW (ref 4.22–5.81)
RDW: 14.6 % (ref 11.5–15.5)
WBC: 10.9 10*3/uL — ABNORMAL HIGH (ref 4.0–10.5)
nRBC: 0 % (ref 0.0–0.2)

## 2021-12-09 LAB — URINALYSIS, ROUTINE W REFLEX MICROSCOPIC
Bilirubin Urine: NEGATIVE
Glucose, UA: NEGATIVE mg/dL
Ketones, ur: 5 mg/dL — AB
Nitrite: NEGATIVE
Protein, ur: 100 mg/dL — AB
Specific Gravity, Urine: 1.026 (ref 1.005–1.030)
WBC, UA: >50 WBC/hpf — ABNORMAL HIGH (ref 0–5)
pH: 5 (ref 5.0–8.0)

## 2021-12-09 LAB — GLUCOSE, CAPILLARY
Glucose-Capillary: 111 mg/dL — ABNORMAL HIGH (ref 70–99)
Glucose-Capillary: 116 mg/dL — ABNORMAL HIGH (ref 70–99)
Glucose-Capillary: 121 mg/dL — ABNORMAL HIGH (ref 70–99)
Glucose-Capillary: 133 mg/dL — ABNORMAL HIGH (ref 70–99)
Glucose-Capillary: 139 mg/dL — ABNORMAL HIGH (ref 70–99)
Glucose-Capillary: 78 mg/dL (ref 70–99)

## 2021-12-09 LAB — BASIC METABOLIC PANEL
Anion gap: 12 (ref 5–15)
BUN: 22 mg/dL (ref 8–23)
CO2: 25 mmol/L (ref 22–32)
Calcium: 8.7 mg/dL — ABNORMAL LOW (ref 8.9–10.3)
Chloride: 99 mmol/L (ref 98–111)
Creatinine, Ser: 1.01 mg/dL (ref 0.61–1.24)
GFR, Estimated: >60 mL/min (ref >60)
Glucose, Bld: 114 mg/dL — ABNORMAL HIGH (ref 70–99)
Potassium: 3.7 mmol/L (ref 3.5–5.1)
Sodium: 136 mmol/L (ref 135–145)

## 2021-12-09 MED ORDER — METOPROLOL TARTRATE 5 MG/5ML IV SOLN
5.0000 mg | Freq: Four times a day (QID) | INTRAVENOUS | Status: DC | PRN
  Administered 2021-12-09 – 2021-12-30 (×2): 5 mg via INTRAVENOUS
  Filled 2021-12-09 (×3): qty 5

## 2021-12-09 MED ORDER — ORAL CARE MOUTH RINSE
15.0000 mL | OROMUCOSAL | Status: DC
  Administered 2021-12-09 – 2022-03-16 (×327): 15 mL via OROMUCOSAL

## 2021-12-09 MED ORDER — PIPERACILLIN-TAZOBACTAM 3.375 G IVPB
3.3750 g | Freq: Three times a day (TID) | INTRAVENOUS | Status: AC
  Administered 2021-12-09 – 2021-12-13 (×14): 3.375 g via INTRAVENOUS
  Filled 2021-12-09 (×14): qty 50

## 2021-12-09 MED ORDER — METOPROLOL TARTRATE 5 MG/5ML IV SOLN
INTRAVENOUS | Status: AC
  Filled 2021-12-09: qty 5

## 2021-12-09 MED ORDER — PANTOPRAZOLE 2 MG/ML SUSPENSION
40.0000 mg | Freq: Two times a day (BID) | ORAL | Status: DC
  Administered 2021-12-09 – 2021-12-14 (×10): 40 mg
  Filled 2021-12-09 (×11): qty 20

## 2021-12-09 MED ORDER — ORAL CARE MOUTH RINSE: 15.0000 mL | OROMUCOSAL | Status: DC | PRN

## 2021-12-09 NOTE — Procedures (Signed)
Tracheostomy Change Note  Patient Details:   Name: Bill Brooks. DOB: 19-Jan-1961 MRN: [id number]    Airway Documentation:     Evaluation  O2 sats: stable throughout Complications: No apparent complications Patient did tolerate procedure well. Bilateral Breath Sounds: Rhonchi   Patients trach changed to #6 shiley cuffless. RT assist x2. Patient tolerated well. Positive color change on CO2 and new trach ties secure. RT will monitor.   Patric Buckhalter H Juanmiguel Defelice 12/09/2021, 11:05 AM

## 2021-12-09 NOTE — Progress Notes (Addendum)
Pt vomited before the tube feeding was initiated, hence continued to HOLD tube feeds and restarted D10 as per MD orders, notified MD On Call via page
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
## 2021-12-09 NOTE — Progress Notes (Signed)
Pt's HR went up to 160's, had a vomit, EKG showed SVT, On call MD notified and gave new orders
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
## 2021-12-09 NOTE — Progress Notes (Signed)
Supervising Physician: Ruel Favors  Patient Status:  [facility] - In-pt  Chief Complaint:  1 day s/p conversion of G to GJ tube  Subjective:  Just began feeds this morning RN at bedside voices no concerns since placement of J limb.   Allergies: Patient has no known allergies.  Medications: Prior to Admission medications   Medication Sig Start Date End Date Taking? Authorizing Provider  acetaminophen (TYLENOL) 500 MG tablet Take 2 tablets (1,000 mg total) by mouth every 6 (six) hours. Patient not taking: Reported on 11/13/2021 01/11/20   Adam Phenix, PA-C  docusate sodium (COLACE) 100 MG capsule Take 1 capsule (100 mg total) by mouth 2 (two) times daily. Patient not taking: Reported on 11/13/2021 01/11/20   Adam Phenix, PA-C     Vital Signs: BP (!) 128/97   Pulse 87   Temp 99 F (37.2 C) (Axillary)   Resp (!) 30   Ht 6\' 1"  (1.854 m)   Wt 171 lb 1.2 oz (77.6 kg)   SpO2 94%   BMI 22.57 kg/m   Physical Exam Constitutional:      General: He is awake.     Appearance: He is ill-appearing.  Abdominal:     General: Abdomen is flat.     Palpations: Abdomen is soft.     Comments: Tube intact.  Feeds progressing though tube.  Skin site of tract unremarkable.  Skin:    General: Skin is warm and dry.  Neurological:     Comments: Does not respond when spoken to or track with eyes when moving through the room.  Gaze fixed ahead.     Imaging: IR GASTR TUBE CONVERT GASTR-JEJ PER W/FL MOD SED  Result Date: 12/08/2021 INDICATION: 61 year old with a pull-through gastrostomy tube. Request for conversion to a GJ tube due to patient vomiting. EXAM: CONVERSION OF GASTROSTOMY TUBE TO A GJ TUBE WITH FLUOROSCOPY MEDICATIONS: None ANESTHESIA/SEDATION: None CONTRAST:  30 mL Omnipaque 300-administered into the gastric lumen. FLUOROSCOPY: Radiation Exposure Index (as provided by the fluoroscopic device): 35 mGy Kerma COMPLICATIONS: None immediate. PROCEDURE: Consent was obtained  for the tube conversion. Patient was placed supine on the interventional table. The existing tube and surrounding skin were prepped and draped in sterile fashion. Maximal barrier sterile technique was utilized including caps, mask, sterile gowns, sterile gloves, sterile drape, hand hygiene and skin antiseptic. Contrast was injected through the gastrostomy tube. C2 catheter was used to advance a stiff Glidewire into the distal stomach. C2 catheter was exchanged for a Mariner catheter which was then advanced into the duodenum using the wire provided with the J tube. Wire was advanced into the proximal jejunum. The gastrostomy tube was cut to an appropriate length. 9 French jejunal tube was advanced over the wire and the tip was positioned in the distal duodenum near the ligament of Treitz. J port injection confirmed placement in the small bowel. Contrast was then injected through the G port to confirm placement in the stomach. Both lumens were flushed with saline. The connection between the G and the J tube was reinforced with a silk suture. Fluoroscopic images were taken and saved for this procedure. FINDINGS: Jejunal tip is in the distal duodenum near the ligament of Treitz. IMPRESSION: Successful conversion of the existing gastrostomy tube to a GJ tube. Electronically Signed   By: 67 M.D.   On: 12/08/2021 17:28   DG Shoulder Left  Result Date: 12/08/2021 CLINICAL DATA:  [id number] Fracture [id number] EXAM: LEFT SHOULDER - 2+  VIEW COMPARISON:  November 20, 2021 FINDINGS: Status post ORIF of the acromion and scapula. Orthopedic hardware is intact and without periprosthetic fracture or lucency. Partial visualization of multiple LEFT-sided rib fractures. Tracheostomy. Partial visualization of a LEFT-sided PICC. IMPRESSION: Status post ORIF of the acromion and scapula without evidence of hardware complication. Electronically Signed   By: Valentino Saxon M.D.   On: 12/08/2021 11:13   DG Clavicle Right  Result  Date: 12/08/2021 CLINICAL DATA:  [id number] Fracture [id number] EXAM: RIGHT CLAVICLE - 2+ VIEWS COMPARISON:  November 20, 2021 FINDINGS: Status post ORIF of the RIGHT clavicle. Orthopedic hardware is intact and without periprosthetic fracture or lucency. Fracture fragments are in unchanged alignment. Mildly increased periosteal reaction, consistent with healing response. Partial visualization of a RIGHT humeral orthopedic hardware and tracheostomy. IMPRESSION: Status post ORIF of the RIGHT clavicle without evidence of hardware complication. Electronically Signed   By: Valentino Saxon M.D.   On: 12/08/2021 11:11   DG Humerus Right  Result Date: 12/08/2021 CLINICAL DATA:  [id number] Fracture [id number] EXAM: RIGHT HUMERUS - 2+ VIEW COMPARISON:  November 20, 2021 FINDINGS: Status post ORIF of the humerus. Orthopedic hardware is intact and without periprosthetic fracture or lucency. Mildly increased periosteal reaction adjacent to the midshaft fracture consistent with healing response. Fracture is in unchanged alignment. Partial visualization of multiple RIGHT-sided rib fractures. IMPRESSION: Status post ORIF of the humerus without evidence of hardware complication. Electronically Signed   By: Valentino Saxon M.D.   On: 12/08/2021 11:10   DG Abd 1 View  Result Date: 12/06/2021 CLINICAL DATA:  Ileus. EXAM: ABDOMEN - 1 VIEW COMPARISON:  November 21, 2021. FINDINGS: The bowel gas pattern is normal. Gastrostomy tube is noted in left upper quadrant. No radio-opaque calculi or other significant radiographic abnormality are seen. IMPRESSION: No abnormal bowel dilatation. Electronically Signed   By: Marijo Conception M.D.   On: 12/06/2021 10:47    Labs:  CBC: Recent Labs    12/06/21 0533 12/07/21 0527 12/08/21 0348 12/09/21 0553  WBC 10.0 7.4 10.1 10.9*  HGB 7.4* 7.4* 8.2* 8.6*  HCT 23.4* 23.2* 25.2* 27.3*  PLT 484* 454* 507* 528*    COAGS: Recent Labs    11/13/21 1544  INR 1.0    BMP: Recent Labs     12/06/21 0533 12/07/21 0527 12/08/21 0348 12/09/21 0553  NA 136 134* 135 136  K 4.2 3.7 4.0 3.7  CL 99 105 99 99  CO2 25 22 24 25   GLUCOSE 120* 82 102* 114*  BUN 31* 28* 28* 22  CALCIUM 8.6* 7.7* 8.7* 8.7*  CREATININE 1.06 0.92 1.10 1.01  GFRNONAA >60 >60 >60 >60    LIVER FUNCTION TESTS: Recent Labs    11/13/21 2027 11/16/21 1010 11/19/21 0525  BILITOT 0.8 0.7 1.2  AST 282* 96* 52*  ALT 154* 83* 56*  ALKPHOS 56 48 96  PROT 6.2* 5.9* 5.9*  ALBUMIN 3.5 2.6* 2.1*    Assessment and Plan:  1 day s/p G to Gj conversion --tube functioning well.  Will have to see if patient tolerates better than the G. --reminded nurse that the J is smaller lumen and does require routine flushing to prevent clogs.  RN voices understanding. --IR available as needed.    Electronically Signed: Pasty Spillers, PA 12/09/2021, 12:40 PM   I spent a total of 15 Minutes at the the patient's bedside AND on the patient's hospital floor or unit, greater than 50% of which was counseling/coordinating care for GJ tube
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
## 2021-12-10 ENCOUNTER — Inpatient Hospital Stay (HOSPITAL_COMMUNITY): Payer: Self-pay

## 2021-12-10 LAB — BASIC METABOLIC PANEL
Anion gap: 11 (ref 5–15)
BUN: 24 mg/dL — ABNORMAL HIGH (ref 8–23)
CO2: 27 mmol/L (ref 22–32)
Calcium: 8.7 mg/dL — ABNORMAL LOW (ref 8.9–10.3)
Chloride: 97 mmol/L — ABNORMAL LOW (ref 98–111)
Creatinine, Ser: 1.09 mg/dL (ref 0.61–1.24)
GFR, Estimated: >60 mL/min (ref >60)
Glucose, Bld: 139 mg/dL — ABNORMAL HIGH (ref 70–99)
Potassium: 3.3 mmol/L — ABNORMAL LOW (ref 3.5–5.1)
Sodium: 135 mmol/L (ref 135–145)

## 2021-12-10 LAB — CBC
HCT: 26.6 % — ABNORMAL LOW (ref 39.0–52.0)
Hemoglobin: 8.8 g/dL — ABNORMAL LOW (ref 13.0–17.0)
MCH: 27.8 pg (ref 26.0–34.0)
MCHC: 33.1 g/dL (ref 30.0–36.0)
MCV: 83.9 fL (ref 80.0–100.0)
Platelets: 508 10*3/uL — ABNORMAL HIGH (ref 150–400)
RBC: 3.17 MIL/uL — ABNORMAL LOW (ref 4.22–5.81)
RDW: 14.4 % (ref 11.5–15.5)
WBC: 9.5 10*3/uL (ref 4.0–10.5)
nRBC: 0 % (ref 0.0–0.2)

## 2021-12-10 LAB — GLUCOSE, CAPILLARY
Glucose-Capillary: 109 mg/dL — ABNORMAL HIGH (ref 70–99)
Glucose-Capillary: 110 mg/dL — ABNORMAL HIGH (ref 70–99)
Glucose-Capillary: 119 mg/dL — ABNORMAL HIGH (ref 70–99)
Glucose-Capillary: 123 mg/dL — ABNORMAL HIGH (ref 70–99)
Glucose-Capillary: 124 mg/dL — ABNORMAL HIGH (ref 70–99)
Glucose-Capillary: 130 mg/dL — ABNORMAL HIGH (ref 70–99)
Glucose-Capillary: 98 mg/dL (ref 70–99)

## 2021-12-10 MED ORDER — POTASSIUM CHLORIDE 20 MEQ PO PACK
40.0000 meq | PACK | ORAL | Status: AC
  Administered 2021-12-10 (×2): 40 meq
  Filled 2021-12-10 (×2): qty 2
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
## 2021-12-10 NOTE — Procedures (Signed)
Tracheostomy Change Note  Patient Details:   Name: Bill [facility]. DOB: [birth year] MRN: [id number]    Airway Documentation:     Evaluation  O2 sats: stable throughout Complications: No apparent complications Patient did tolerate procedure well. Bilateral Breath Sounds: Diminished, Rhonchi    Patients trach downsized to #4 shiley cuffless. Positive color change on CO2 and new trach ties secure. RT x2 assist. Patient tolerated well. RT will monitor.  Keo Schirmer H Vada Swift 12/10/2021, 1:40 PM

## 2021-12-10 NOTE — Progress Notes (Signed)
RT went to assess patient this morning, and tried to suction trach and catheter would not pass. Inner cannula changed and still wouldn't pass. CO2 placed on trach with no color change. RT x2 tried to place trach back in the right track with no color change. MD notified. [place] placed for sats.

## 2021-12-10 NOTE — Progress Notes (Signed)
   12/10/21 2154  Adult Ventilator Measurements  SpO2 91 %  Oral Suctioning/Secretions  Suction Type Oral  Suction Device Yankauer  Secretion Amount Moderate  Secretion Color Pink tinged  Secretion Consistency Thick;Thin  Suction Tolerance Tolerated fairly well  Suctioning Adverse Effects None  Airway Suctioning/Secretions  Suction Type Tracheal  Suction Device  Catheter  Secretion Amount Copious  Secretion Color White;Pink tinged  Secretion Consistency Thick;Thin  Suction Tolerance Tolerated well  Suctioning Adverse Effects None   Unable to advance catheter checked with a CO2 detector positive color change, pt. Not complaining of problem breathing

## 2021-12-10 NOTE — Progress Notes (Signed)
STAT CXR ordered.

## 2021-12-10 NOTE — Progress Notes (Signed)
Pt's trach was dislodged with no color change. Attempted to reinsert. Attempt successful with positive color and now able to pass suction catheter. Vitals are stable.

## 2021-12-11 LAB — GLUCOSE, CAPILLARY
Glucose-Capillary: 92 mg/dL (ref 70–99)
Glucose-Capillary: 108 mg/dL — ABNORMAL HIGH (ref 70–99)
Glucose-Capillary: 123 mg/dL — ABNORMAL HIGH (ref 70–99)
Glucose-Capillary: 97 mg/dL (ref 70–99)
Glucose-Capillary: 92 mg/dL (ref 70–99)

## 2021-12-11 LAB — BASIC METABOLIC PANEL
Anion gap: 13 (ref 5–15)
BUN: 22 mg/dL (ref 8–23)
CO2: 26 mmol/L (ref 22–32)
Calcium: 8.8 mg/dL — ABNORMAL LOW (ref 8.9–10.3)
Chloride: 96 mmol/L — ABNORMAL LOW (ref 98–111)
Creatinine, Ser: 1.05 mg/dL (ref 0.61–1.24)
GFR, Estimated: >60 mL/min (ref >60)
Glucose, Bld: 128 mg/dL — ABNORMAL HIGH (ref 70–99)
Potassium: 4.1 mmol/L (ref 3.5–5.1)
Sodium: 135 mmol/L (ref 135–145)

## 2021-12-11 LAB — CBC
HCT: 24.9 % — ABNORMAL LOW (ref 39.0–52.0)
Hemoglobin: 8.2 g/dL — ABNORMAL LOW (ref 13.0–17.0)
MCH: 27.6 pg (ref 26.0–34.0)
MCHC: 32.9 g/dL (ref 30.0–36.0)
MCV: 83.8 fL (ref 80.0–100.0)
Platelets: 458 10*3/uL — ABNORMAL HIGH (ref 150–400)
RBC: 2.97 MIL/uL — ABNORMAL LOW (ref 4.22–5.81)
RDW: 14.6 % (ref 11.5–15.5)
WBC: 8.3 10*3/uL (ref 4.0–10.5)
nRBC: 0 % (ref 0.0–0.2)

## 2021-12-11 MED ORDER — LORAZEPAM 1 MG PO TABS
1.0000 mg | ORAL_TABLET | ORAL | Status: DC | PRN
  Administered 2021-12-11 – 2021-12-13 (×3): 1 mg
  Filled 2021-12-11 (×3): qty 1

## 2021-12-11 NOTE — Progress Notes (Signed)
Patient decannulated per MD. Patient tolerated well. Stoma covered with gauze. Patient placed on 2L [place].
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
## 2021-12-12 LAB — CULTURE, RESPIRATORY W GRAM STAIN

## 2021-12-12 LAB — CBC
HCT: 25.8 % — ABNORMAL LOW (ref 39.0–52.0)
Hemoglobin: 8.6 g/dL — ABNORMAL LOW (ref 13.0–17.0)
MCH: 27.9 pg (ref 26.0–34.0)
MCHC: 33.3 g/dL (ref 30.0–36.0)
MCV: 83.8 fL (ref 80.0–100.0)
Platelets: 488 10*3/uL — ABNORMAL HIGH (ref 150–400)
RBC: 3.08 MIL/uL — ABNORMAL LOW (ref 4.22–5.81)
RDW: 14.6 % (ref 11.5–15.5)
WBC: 8.7 10*3/uL (ref 4.0–10.5)
nRBC: 0 % (ref 0.0–0.2)

## 2021-12-12 LAB — BASIC METABOLIC PANEL
Anion gap: 7 (ref 5–15)
BUN: 21 mg/dL (ref 8–23)
CO2: 28 mmol/L (ref 22–32)
Calcium: 8.7 mg/dL — ABNORMAL LOW (ref 8.9–10.3)
Chloride: 99 mmol/L (ref 98–111)
Creatinine, Ser: 1.02 mg/dL (ref 0.61–1.24)
GFR, Estimated: >60 mL/min (ref >60)
Glucose, Bld: 120 mg/dL — ABNORMAL HIGH (ref 70–99)
Potassium: 3.9 mmol/L (ref 3.5–5.1)
Sodium: 134 mmol/L — ABNORMAL LOW (ref 135–145)

## 2021-12-12 LAB — GLUCOSE, CAPILLARY
Glucose-Capillary: 112 mg/dL — ABNORMAL HIGH (ref 70–99)
Glucose-Capillary: 127 mg/dL — ABNORMAL HIGH (ref 70–99)
Glucose-Capillary: 150 mg/dL — ABNORMAL HIGH (ref 70–99)
Glucose-Capillary: 98 mg/dL (ref 70–99)
Glucose-Capillary: 99 mg/dL (ref 70–99)

## 2021-12-12 MED ORDER — CLONAZEPAM 0.5 MG PO TABS: 0.2500 mg | ORAL_TABLET | Freq: Two times a day (BID) | ORAL | Status: DC

## 2021-12-12 MED ORDER — HALOPERIDOL LACTATE 5 MG/ML IJ SOLN
INTRAMUSCULAR | Status: AC
  Administered 2021-12-12: 5 mg
  Filled 2021-12-12: qty 1

## 2021-12-12 MED ORDER — FREE WATER
100.0000 mL | Freq: Three times a day (TID) | Status: DC
  Administered 2021-12-14: 100 mL

## 2021-12-12 MED ORDER — HALOPERIDOL LACTATE 5 MG/ML IJ SOLN
5.0000 mg | Freq: Four times a day (QID) | INTRAMUSCULAR | Status: DC | PRN
  Administered 2021-12-13 – 2021-12-28 (×15): 5 mg via INTRAMUSCULAR
  Filled 2021-12-12 (×16): qty 1

## 2021-12-12 MED ORDER — CLONAZEPAM 0.25 MG PO TBDP
0.5000 mg | ORAL_TABLET | Freq: Two times a day (BID) | ORAL | Status: DC
  Administered 2021-12-13 – 2021-12-14 (×3): 0.5 mg
  Filled 2021-12-12 (×4): qty 2

## 2021-12-12 MED ORDER — CLONAZEPAM 0.25 MG PO TBDP: 0.2500 mg | ORAL_TABLET | Freq: Two times a day (BID) | ORAL | Status: DC

## 2021-12-12 MED ORDER — CHOLECALCIFEROL 10 MCG/ML (400 UNIT/ML) PO LIQD
1000.0000 [IU] | Freq: Every day | ORAL | Status: DC
  Filled 2021-12-12 (×2): qty 2.5

## 2021-12-12 NOTE — Progress Notes (Signed)
Patient irritable and pulling at lines. Mittens placed on patient. Patient also refused meds through tube. Would trying hit and kick so meds were charted as not given. Bill Brooks made aware and stated we would try mittens first.
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
## 2021-12-12 NOTE — TOC Progression Note (Signed)
Transition of Care (TOC) - Progression Note    Patient Details  Name: Bill Brooks. MRN: [id number] Date of Birth: 1960/09/28  Transition of Care [facility]) CM/SW Contact  [name], RN Phone Number: 12/12/2021, 12:20pm  Clinical Narrative:    Met with patient, sister Bill Brooks, and cousin Bill Brooks at bedside to discuss discharge plans.  Patient quiet and not participating in conversation.  Sister states ultimately family would prefer inpatient rehab if possible, and she and family are prepared to provide 24-hour care at discharge.  PT/OT recommending SNF at this time; will request inpatient rehab screening for potential admission.  If inappropriate for CIR, family would likely prefer SNF for rehab.   Expected Discharge Plan: IP Rehab Facility Barriers to Discharge: Continued Medical Work up  Expected Discharge Plan and Services Expected Discharge Plan: [name]   Discharge Planning Services: CM Consult Post Acute Care Choice: IP Rehab Living arrangements for the past 2 months: Apartment                                       Social Determinants of Health (SDOH) Interventions    Readmission Risk Interventions    01/11/2020   10:04 AM  Readmission Risk Prevention Plan  Post Dischage Appt Complete  Medication Screening Complete  Transportation Screening Complete   Reinaldo Raddle, RN, BSN  Trauma/Neuro ICU Case Manager [phone]

## 2021-12-12 NOTE — Progress Notes (Addendum)
Called by patient's RN at 9:41pm regarding agitation, reported that PEG had been pulled out during the day shift. This was not documented nor reported to me (on call since 5pm).  Haldol IM and restraints ordered. Foley catheter was inserted into g tube tract and went in easily, thin light output draining into foley bag. Afebrile, VSS, abdomen is soft and nontender. Will keep foley in tract to gravity overnight and have placed order for tube study/ GJ replacement in AM
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
## 2021-12-12 NOTE — Progress Notes (Addendum)
Inpatient Rehab Admissions Coordinator:   Per [facility] request,  patient was screened for CIR candidacy by Clemens Catholic, MS, CCC-SLP . At this time, Pt. Appears to be a a potential candidate for CIR. I will place order for rehab consult per protocol for full assessment. Please contact me any with questions.  Clemens Catholic, [place], [place] Admissions Coordinator  [phone] ([place]) [phone] (office)

## 2021-12-12 NOTE — Progress Notes (Signed)
He is getting more restless and agitated, keep trying to get out of the bed. MD informed, PRN med given.

## 2021-12-12 NOTE — Progress Notes (Signed)
   12/12/21 2142  Provider Notification  Provider Name/Title Kae Heller, MD  Date Provider Notified 12/12/21  Time Provider Notified 2142  Method of Notification Page  Notification Reason Other (Comment) (Confused. Very hard to redirect. Combative. Pulled GJ tube on dayshift. NPO and unable to give meds via [place] tube. Patient found on the floor at 2125. No injuries noted. MOE x 4. No acute distress noted.)  Provider response See new orders;Other (Comment) (MD at bedside to assess patient. New order for Haldol IM noted. Wrists restraints applied per order.)  Date of Provider Response 12/12/21  Time of Provider Response 2145
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
## 2021-12-13 ENCOUNTER — Inpatient Hospital Stay (HOSPITAL_COMMUNITY): Payer: Self-pay

## 2021-12-13 LAB — GLUCOSE, CAPILLARY
Glucose-Capillary: 101 mg/dL — ABNORMAL HIGH (ref 70–99)
Glucose-Capillary: 106 mg/dL — ABNORMAL HIGH (ref 70–99)
Glucose-Capillary: 124 mg/dL — ABNORMAL HIGH (ref 70–99)
Glucose-Capillary: 83 mg/dL (ref 70–99)
Glucose-Capillary: 86 mg/dL (ref 70–99)
Glucose-Capillary: 98 mg/dL (ref 70–99)
Glucose-Capillary: 99 mg/dL (ref 70–99)

## 2021-12-13 MED ORDER — JEVITY 1.5 CAL/FIBER PO LIQD
1000.0000 mL | ORAL | Status: DC
  Filled 2021-12-13 (×3): qty 1000

## 2021-12-13 MED ORDER — PROSOURCE TF20 ENFIT COMPATIBL EN LIQD
60.0000 mL | Freq: Every day | ENTERAL | Status: DC
  Administered 2021-12-13 – 2021-12-14 (×2): 60 mL
  Filled 2021-12-13 (×2): qty 60

## 2021-12-13 MED ORDER — DIATRIZOATE MEGLUMINE & SODIUM 66-10 % PO SOLN
ORAL | Status: AC
  Administered 2021-12-13: 30 mL
  Filled 2021-12-13: qty 30

## 2021-12-13 MED ORDER — VITAMIN D 25 MCG (1000 UNIT) PO TABS
1000.0000 [IU] | ORAL_TABLET | Freq: Every day | ORAL | Status: DC
  Administered 2021-12-13 – 2021-12-14 (×2): 1000 [IU]
  Filled 2021-12-13 (×2): qty 1

## 2021-12-13 NOTE — Progress Notes (Addendum)
   12/13/21 0554  Provider Notification  Provider Name/Title Romana Juniper MD  Date Provider Notified 12/13/21  Time Provider Notified [phone]  Method of Notification Page  Notification Reason Other (Comment) (Continues restlessness. Climbing out of the bed. Unable to follow directions. On bilateral wrist restraints. Waiting on vest restraints. Need one on one safety sitter due to pulling out GJ tube, fall, and interruption of medication administration.)  Provider response Other (Comment) (Waiting on response.)    See order for patient safety sitter.

## 2021-12-13 NOTE — Progress Notes (Signed)
No result yet for the xray on tube placement. Per Dellia Nims please wait on feeding. if everything looks good with tube can leave it clamped and trial po diet instead of tube feeds
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
## 2021-12-13 NOTE — Progress Notes (Signed)
Modified Barium Swallow Progress Note  Patient Details  Name: Bill Brooks. MRN: [id number] Date of Birth: [birth year]  Today's Date: 12/13/2021  Modified Barium Swallow completed.  Full report located under Chart Review in the Imaging Section.  Brief recommendations include the following:  Clinical Impression  Pt presents with mild oral dysphagia primarily due to AMS. Pt orally held boluses of thin liquids requiring maximal cues to swallow. Piecemeal swallow noted with puree consistency trials. Regular consistency trials showed considerably prolonged mastication and a delay in swallow initiation to the level of the valleculae x1. Two instances of flash penetration noted with larger boluses of thin liquids due to a reduction in laryngeal vestibule closure (PAS 2). Hyolaryngeal excursion and pharyngeal contractions are within functional limits. Recommend thin liquid/Dys 2, full supervision to control rate/volume, medications crushed in puree. ST will follow for diet tolerance v. advancement.   Swallow Evaluation Recommendations       SLP Diet Recommendations: Dysphagia 2 (Fine chop) solids;Thin liquid   Liquid Administration via: Straw;Cup   Medication Administration: Crushed with puree   Supervision: Full supervision/cueing for compensatory strategies   Compensations: Slow rate;Small sips/bites;Minimize environmental distractions   Postural Changes: Seated upright at 90 degrees   Oral Care Recommendations: Oral care BID        Houston Siren 12/13/2021,11:19 AM

## 2021-12-13 NOTE — Progress Notes (Signed)
Foley removed from PEG tube site and 27fr G tube placed without complications. PEG placement xray ordered showing tube in position.
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
## 2021-12-13 NOTE — Progress Notes (Addendum)
Inpatient Rehabilitation Admissions Coordinator   I met at bedside with patient this morning. No family present. He was very restless and agitated, restrained and asking to be able to get up. Not following commands. I will contact his sister, Bill Brooks to discuss his rehab options. He currently is not at a level to consider for CIR level rehab. Other rehab venues need to be pursued.  Danne Baxter, RN, MSN Rehab Admissions Coordinator [phone] 12/13/2021 1:05 PM  I spoke with his sister, Bill Brooks, by phone. Patient has been homeless for greater than 6 months. She would like him to live with her, but I explained that he currently is not at a level that I can pursue CIR admit. She is in agreement to SNF, but understands the barrier that he is uninsured. I will follow from a distance.  Danne Baxter, RN, MSN Rehab Admissions Coordinator [phone] 12/13/2021 2:01 PM

## 2021-12-14 LAB — CBC
HCT: 27.8 % — ABNORMAL LOW (ref 39.0–52.0)
Hemoglobin: 8.9 g/dL — ABNORMAL LOW (ref 13.0–17.0)
MCH: 26.9 pg (ref 26.0–34.0)
MCHC: 32 g/dL (ref 30.0–36.0)
MCV: 84 fL (ref 80.0–100.0)
Platelets: 491 10*3/uL — ABNORMAL HIGH (ref 150–400)
RBC: 3.31 MIL/uL — ABNORMAL LOW (ref 4.22–5.81)
RDW: 14.9 % (ref 11.5–15.5)
WBC: 9.7 10*3/uL (ref 4.0–10.5)
nRBC: 0 % (ref 0.0–0.2)

## 2021-12-14 LAB — BASIC METABOLIC PANEL
Anion gap: 14 (ref 5–15)
BUN: 19 mg/dL (ref 8–23)
CO2: 24 mmol/L (ref 22–32)
Calcium: 9.4 mg/dL (ref 8.9–10.3)
Chloride: 99 mmol/L (ref 98–111)
Creatinine, Ser: 1.28 mg/dL — ABNORMAL HIGH (ref 0.61–1.24)
GFR, Estimated: >60 mL/min (ref >60)
Glucose, Bld: 105 mg/dL — ABNORMAL HIGH (ref 70–99)
Potassium: 3.5 mmol/L (ref 3.5–5.1)
Sodium: 137 mmol/L (ref 135–145)

## 2021-12-14 LAB — GLUCOSE, CAPILLARY
Glucose-Capillary: 105 mg/dL — ABNORMAL HIGH (ref 70–99)
Glucose-Capillary: 133 mg/dL — ABNORMAL HIGH (ref 70–99)
Glucose-Capillary: 79 mg/dL (ref 70–99)
Glucose-Capillary: 85 mg/dL (ref 70–99)
Glucose-Capillary: 88 mg/dL (ref 70–99)
Glucose-Capillary: 92 mg/dL (ref 70–99)

## 2021-12-14 MED ORDER — THIAMINE MONONITRATE 100 MG PO TABS
100.0000 mg | ORAL_TABLET | Freq: Every day | ORAL | Status: DC
  Administered 2021-12-15 – 2021-12-19 (×5): 100 mg via ORAL
  Filled 2021-12-14 (×5): qty 1

## 2021-12-14 MED ORDER — ENSURE ENLIVE PO LIQD
237.0000 mL | Freq: Two times a day (BID) | ORAL | Status: DC
  Administered 2021-12-14 – 2021-12-19 (×5): 237 mL via ORAL

## 2021-12-14 MED ORDER — QUETIAPINE FUMARATE 50 MG PO TABS
100.0000 mg | ORAL_TABLET | Freq: Every morning | ORAL | Status: DC
  Administered 2021-12-15 – 2021-12-20 (×6): 100 mg via ORAL
  Filled 2021-12-14 (×6): qty 2

## 2021-12-14 MED ORDER — CLONAZEPAM 0.25 MG PO TBDP
1.0000 mg | ORAL_TABLET | Freq: Two times a day (BID) | ORAL | Status: DC
  Administered 2021-12-14 – 2021-12-18 (×8): 1 mg via ORAL
  Filled 2021-12-14 (×7): qty 4

## 2021-12-14 MED ORDER — JEVITY 1.5 CAL/FIBER PO LIQD
1000.0000 mL | ORAL | Status: DC
  Administered 2021-12-14: 1000 mL
  Filled 2021-12-14 (×2): qty 1000

## 2021-12-14 MED ORDER — GUAIFENESIN 100 MG/5ML PO LIQD
15.0000 mL | Freq: Four times a day (QID) | ORAL | Status: DC
  Administered 2021-12-14 – 2021-12-20 (×13): 15 mL via ORAL
  Filled 2021-12-14 (×24): qty 15

## 2021-12-14 MED ORDER — DOCUSATE SODIUM 50 MG/5ML PO LIQD
100.0000 mg | Freq: Two times a day (BID) | ORAL | Status: DC | PRN
  Administered 2022-01-11 – 2022-01-18 (×3): 100 mg via ORAL
  Filled 2021-12-14 (×3): qty 10

## 2021-12-14 MED ORDER — PANTOPRAZOLE 2 MG/ML SUSPENSION
40.0000 mg | Freq: Two times a day (BID) | ORAL | Status: DC
  Administered 2021-12-14 – 2021-12-19 (×10): 40 mg via ORAL
  Filled 2021-12-14 (×12): qty 20

## 2021-12-14 MED ORDER — VITAMIN D 25 MCG (1000 UNIT) PO TABS
1000.0000 [IU] | ORAL_TABLET | Freq: Every day | ORAL | Status: DC
  Administered 2021-12-15 – 2021-12-19 (×5): 1000 [IU] via ORAL
  Filled 2021-12-14 (×6): qty 1

## 2021-12-14 MED ORDER — LORAZEPAM 1 MG PO TABS
1.0000 mg | ORAL_TABLET | ORAL | Status: DC | PRN
  Administered 2021-12-16 – 2021-12-29 (×25): 1 mg via ORAL
  Filled 2021-12-14 (×26): qty 1

## 2021-12-14 MED ORDER — BETHANECHOL CHLORIDE 25 MG PO TABS
25.0000 mg | ORAL_TABLET | Freq: Three times a day (TID) | ORAL | Status: DC
  Administered 2021-12-14 – 2021-12-18 (×9): 25 mg via ORAL
  Filled 2021-12-14 (×14): qty 1

## 2021-12-14 MED ORDER — METOPROLOL TARTRATE 25 MG/10 ML ORAL SUSPENSION
25.0000 mg | Freq: Two times a day (BID) | ORAL | Status: DC
  Administered 2021-12-14 – 2021-12-19 (×10): 25 mg via ORAL
  Filled 2021-12-14 (×12): qty 10

## 2021-12-14 MED ORDER — CLONAZEPAM 0.25 MG PO TBDP: 1.0000 mg | ORAL_TABLET | Freq: Two times a day (BID) | ORAL | Status: DC

## 2021-12-14 MED ORDER — POLYETHYLENE GLYCOL 3350 17 G PO PACK: 17.0000 g | PACK | Freq: Every day | ORAL | Status: DC | PRN

## 2021-12-14 MED ORDER — ADULT MULTIVITAMIN W/MINERALS CH
1.0000 | ORAL_TABLET | Freq: Every day | ORAL | Status: DC
  Administered 2021-12-15 – 2021-12-19 (×5): 1 via ORAL
  Filled 2021-12-14 (×5): qty 1

## 2021-12-14 MED ORDER — SENNA 8.6 MG PO TABS
2.0000 | ORAL_TABLET | Freq: Every day | ORAL | Status: DC | PRN
  Administered 2021-12-26: 17.2 mg via ORAL
  Filled 2021-12-14: qty 2

## 2021-12-14 MED ORDER — FOLIC ACID 1 MG PO TABS
1.0000 mg | ORAL_TABLET | Freq: Every day | ORAL | Status: DC
  Administered 2021-12-15 – 2021-12-19 (×5): 1 mg via ORAL
  Filled 2021-12-14 (×6): qty 1

## 2021-12-14 MED ORDER — SODIUM CHLORIDE 0.9 % IV SOLN: INTRAVENOUS | Status: DC

## 2021-12-14 MED ORDER — IBUPROFEN 100 MG/5ML PO SUSP
400.0000 mg | Freq: Three times a day (TID) | ORAL | Status: DC | PRN
  Filled 2021-12-14: qty 20

## 2021-12-14 MED ORDER — METHOCARBAMOL 500 MG PO TABS
1000.0000 mg | ORAL_TABLET | Freq: Three times a day (TID) | ORAL | Status: DC
  Administered 2021-12-14 – 2021-12-20 (×14): 1000 mg via ORAL
  Filled 2021-12-14 (×19): qty 2

## 2021-12-14 MED ORDER — ACETAMINOPHEN 500 MG PO TABS
1000.0000 mg | ORAL_TABLET | Freq: Four times a day (QID) | ORAL | Status: DC
  Administered 2021-12-14 – 2021-12-20 (×19): 1000 mg via ORAL
  Filled 2021-12-14 (×22): qty 2

## 2021-12-14 MED ORDER — QUETIAPINE FUMARATE 50 MG PO TABS
300.0000 mg | ORAL_TABLET | Freq: Every day | ORAL | Status: DC
  Administered 2021-12-14 – 2021-12-19 (×6): 300 mg via ORAL
  Filled 2021-12-14 (×6): qty 6

## 2021-12-14 MED ORDER — OXYCODONE HCL 5 MG PO TABS
5.0000 mg | ORAL_TABLET | ORAL | Status: DC | PRN
  Administered 2021-12-18 – 2021-12-20 (×5): 10 mg via ORAL
  Administered 2021-12-21: 5 mg via ORAL
  Administered 2021-12-21 – 2021-12-22 (×2): 10 mg via ORAL
  Administered 2021-12-22: 5 mg via ORAL
  Administered 2021-12-22 – 2021-12-28 (×9): 10 mg via ORAL
  Filled 2021-12-14 (×14): qty 2
  Filled 2021-12-14: qty 1
  Filled 2021-12-14 (×4): qty 2
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
## 2021-12-14 NOTE — Progress Notes (Addendum)
G-tube clamped since yesterday. G-tube used for Meds and flushes without complication. Pt ate approx. 25% of dinner meal. BG have been WDL. Will continue to monitor and assess frequently
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
## 2021-12-15 ENCOUNTER — Inpatient Hospital Stay (HOSPITAL_COMMUNITY): Payer: Self-pay

## 2021-12-15 LAB — BASIC METABOLIC PANEL
Anion gap: 9 (ref 5–15)
BUN: 23 mg/dL (ref 8–23)
CO2: 23 mmol/L (ref 22–32)
Calcium: 8.8 mg/dL — ABNORMAL LOW (ref 8.9–10.3)
Chloride: 104 mmol/L (ref 98–111)
Creatinine, Ser: 1.18 mg/dL (ref 0.61–1.24)
GFR, Estimated: >60 mL/min (ref >60)
Glucose, Bld: 110 mg/dL — ABNORMAL HIGH (ref 70–99)
Potassium: 3.9 mmol/L (ref 3.5–5.1)
Sodium: 136 mmol/L (ref 135–145)

## 2021-12-15 LAB — GLUCOSE, CAPILLARY
Glucose-Capillary: 103 mg/dL — ABNORMAL HIGH (ref 70–99)
Glucose-Capillary: 115 mg/dL — ABNORMAL HIGH (ref 70–99)
Glucose-Capillary: 123 mg/dL — ABNORMAL HIGH (ref 70–99)
Glucose-Capillary: 127 mg/dL — ABNORMAL HIGH (ref 70–99)
Glucose-Capillary: 147 mg/dL — ABNORMAL HIGH (ref 70–99)
Glucose-Capillary: 98 mg/dL (ref 70–99)

## 2021-12-15 LAB — CBC
HCT: 30.1 % — ABNORMAL LOW (ref 39.0–52.0)
Hemoglobin: 9.5 g/dL — ABNORMAL LOW (ref 13.0–17.0)
MCH: 26.8 pg (ref 26.0–34.0)
MCHC: 31.6 g/dL (ref 30.0–36.0)
MCV: 85 fL (ref 80.0–100.0)
Platelets: 552 10*3/uL — ABNORMAL HIGH (ref 150–400)
RBC: 3.54 MIL/uL — ABNORMAL LOW (ref 4.22–5.81)
RDW: 15.1 % (ref 11.5–15.5)
WBC: 8.7 10*3/uL (ref 4.0–10.5)
nRBC: 0 % (ref 0.0–0.2)
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
## 2021-12-16 LAB — GLUCOSE, CAPILLARY
Glucose-Capillary: 99 mg/dL (ref 70–99)
Glucose-Capillary: 78 mg/dL (ref 70–99)
Glucose-Capillary: 71 mg/dL (ref 70–99)
Glucose-Capillary: 65 mg/dL — ABNORMAL LOW (ref 70–99)
Glucose-Capillary: 83 mg/dL (ref 70–99)

## 2021-12-16 MED ORDER — DIPHENHYDRAMINE HCL 50 MG/ML IJ SOLN
50.0000 mg | Freq: Once | INTRAMUSCULAR | Status: AC
  Administered 2021-12-16: 50 mg via INTRAVENOUS
  Filled 2021-12-16: qty 1

## 2021-12-16 MED ORDER — GLUCAGON HCL RDNA (DIAGNOSTIC) 1 MG IJ SOLR
INTRAMUSCULAR | Status: AC
  Filled 2021-12-16: qty 1

## 2021-12-16 MED ORDER — DEXTROSE 50 % IV SOLN
INTRAVENOUS | Status: AC
  Filled 2021-12-16: qty 50

## 2021-12-16 MED ORDER — DEXTROSE 50 % IV SOLN
12.5000 g | INTRAVENOUS | Status: AC
  Administered 2021-12-16: 12.5 g via INTRAVENOUS
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
## 2021-12-16 NOTE — Progress Notes (Signed)
Pt becoming more agitated with the sitter. He wants a cab to pick him up and trying to get OOB even when we explain that he is not safe to get up without assistance. He is not hearing reasoning at this, gave him haldol and dilaudid.  1642- Received call from MD after paging him because pt is still agitated and climbing out the bed. He states " he wants to get dress to find his tv, watch the aggie game, and go home". MD place order see MAR.
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
## 2021-12-17 LAB — GLUCOSE, CAPILLARY
Glucose-Capillary: 58 mg/dL — ABNORMAL LOW (ref 70–99)
Glucose-Capillary: 59 mg/dL — ABNORMAL LOW (ref 70–99)
Glucose-Capillary: 66 mg/dL — ABNORMAL LOW (ref 70–99)
Glucose-Capillary: 74 mg/dL (ref 70–99)
Glucose-Capillary: 76 mg/dL (ref 70–99)
Glucose-Capillary: 80 mg/dL (ref 70–99)
Glucose-Capillary: 82 mg/dL (ref 70–99)
Glucose-Capillary: 82 mg/dL (ref 70–99)

## 2021-12-17 MED ORDER — DIPHENHYDRAMINE HCL 50 MG/ML IJ SOLN
25.0000 mg | Freq: Once | INTRAMUSCULAR | Status: AC
  Administered 2021-12-17: 25 mg via INTRAVENOUS
  Filled 2021-12-17: qty 1

## 2021-12-17 MED ORDER — DEXTROSE 50 % IV SOLN
INTRAVENOUS | Status: AC
  Administered 2021-12-17: 25 mL
  Filled 2021-12-17: qty 50

## 2021-12-17 MED ORDER — OSMOLITE 1.2 CAL PO LIQD
1000.0000 mL | ORAL | Status: DC
  Administered 2021-12-17 – 2021-12-18 (×2): 1000 mL
  Filled 2021-12-17 (×2): qty 1000
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
## 2021-12-17 NOTE — Progress Notes (Signed)
Pt remains awake and alert. He is more calm and redirectable at this time. Safety sitter at bedside.

## 2021-12-17 NOTE — Progress Notes (Signed)
Pt has been drowsy since PRN meds for agitation early this AM. He remains arousable to voice and touch but quickly falls back asleep. CBG 58 at this time. Unable to uitilize PO interventions for low BG; no PRN dextrose available. PA notified.   Dextrose 10% continuous infusion started per [facility] and will be continued until tube feeds are started again.

## 2021-12-17 NOTE — Progress Notes (Signed)
Pt continues to be verbally and physically aggressive towards staff. He remains agitated and trying to get out of bed. Meds for agitation given per MAR. Safety sitter remains at bedside.

## 2021-12-17 NOTE — Progress Notes (Addendum)
Pt remains agitated and combative with staff. Continues to try to get out of bed. Not easily redirected, even with family at bedside. Dr Dema Severin notified; new order per [facility].
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
## 2021-12-17 NOTE — Plan of Care (Signed)
  Problem: Clinical Measurements: Goal: Diagnostic test results will improve Outcome: Progressing   

## 2021-12-18 ENCOUNTER — Inpatient Hospital Stay (HOSPITAL_COMMUNITY): Payer: Self-pay

## 2021-12-18 LAB — GLUCOSE, CAPILLARY
Glucose-Capillary: 101 mg/dL — ABNORMAL HIGH (ref 70–99)
Glucose-Capillary: 106 mg/dL — ABNORMAL HIGH (ref 70–99)
Glucose-Capillary: 55 mg/dL — ABNORMAL LOW (ref 70–99)
Glucose-Capillary: 71 mg/dL (ref 70–99)
Glucose-Capillary: 87 mg/dL (ref 70–99)
Glucose-Capillary: 91 mg/dL (ref 70–99)
Glucose-Capillary: 95 mg/dL (ref 70–99)

## 2021-12-18 LAB — CBC
HCT: 25.6 % — ABNORMAL LOW (ref 39.0–52.0)
Hemoglobin: 8.4 g/dL — ABNORMAL LOW (ref 13.0–17.0)
MCH: 27.5 pg (ref 26.0–34.0)
MCHC: 32.8 g/dL (ref 30.0–36.0)
MCV: 83.9 fL (ref 80.0–100.0)
Platelets: 426 10*3/uL — ABNORMAL HIGH (ref 150–400)
RBC: 3.05 MIL/uL — ABNORMAL LOW (ref 4.22–5.81)
RDW: 15.3 % (ref 11.5–15.5)
WBC: 10.9 10*3/uL — ABNORMAL HIGH (ref 4.0–10.5)
nRBC: 0 % (ref 0.0–0.2)

## 2021-12-18 LAB — URINALYSIS, ROUTINE W REFLEX MICROSCOPIC
Bacteria, UA: NONE SEEN
Bilirubin Urine: NEGATIVE
Glucose, UA: NEGATIVE mg/dL
Ketones, ur: NEGATIVE mg/dL
Nitrite: NEGATIVE
Protein, ur: 30 mg/dL — AB
Specific Gravity, Urine: 1.015 (ref 1.005–1.030)
WBC, UA: >50 WBC/hpf — ABNORMAL HIGH (ref 0–5)
pH: 7 (ref 5.0–8.0)

## 2021-12-18 LAB — BASIC METABOLIC PANEL
Anion gap: 8 (ref 5–15)
BUN: 10 mg/dL (ref 8–23)
CO2: 22 mmol/L (ref 22–32)
Calcium: 8.9 mg/dL (ref 8.9–10.3)
Chloride: 105 mmol/L (ref 98–111)
Creatinine, Ser: 0.88 mg/dL (ref 0.61–1.24)
GFR, Estimated: >60 mL/min (ref >60)
Glucose, Bld: 92 mg/dL (ref 70–99)
Potassium: 3.7 mmol/L (ref 3.5–5.1)
Sodium: 135 mmol/L (ref 135–145)

## 2021-12-18 MED ORDER — MIDAZOLAM HCL 2 MG/2ML IJ SOLN
4.0000 mg | INTRAMUSCULAR | Status: DC | PRN
  Administered 2021-12-18: 4 mg via INTRAVENOUS
  Filled 2021-12-18 (×2): qty 4

## 2021-12-18 MED ORDER — CLONAZEPAM 0.25 MG PO TBDP: 2.0000 mg | ORAL_TABLET | Freq: Two times a day (BID) | ORAL | Status: DC

## 2021-12-18 MED ORDER — PROSOURCE TF20 ENFIT COMPATIBL EN LIQD
60.0000 mL | Freq: Every day | ENTERAL | Status: DC
  Administered 2021-12-19 – 2022-02-28 (×72): 60 mL
  Filled 2021-12-18 (×71): qty 60

## 2021-12-18 MED ORDER — JEVITY 1.5 CAL/FIBER PO LIQD
1000.0000 mL | ORAL | Status: DC
  Administered 2021-12-19 – 2021-12-30 (×9): 1000 mL
  Filled 2021-12-18 (×20): qty 1000

## 2021-12-18 MED ORDER — BETHANECHOL CHLORIDE 25 MG PO TABS
25.0000 mg | ORAL_TABLET | Freq: Three times a day (TID) | ORAL | Status: DC
  Administered 2021-12-18 – 2021-12-24 (×20): 25 mg
  Filled 2021-12-18 (×21): qty 1

## 2021-12-18 MED ORDER — CLONAZEPAM 0.5 MG PO TABS
2.0000 mg | ORAL_TABLET | Freq: Two times a day (BID) | ORAL | Status: DC
  Administered 2021-12-18 – 2021-12-19 (×3): 2 mg via ORAL
  Filled 2021-12-18 (×3): qty 4

## 2021-12-18 NOTE — Progress Notes (Signed)
Hypoglycemic Event  CBG: 55  Treatment: 4 oz juice/soda via peg tube  Symptoms: None  Follow-up CBG: Time:101 CBG Result:0848  Possible Reasons for Event: Inadequate meal intake  Comments/MD notified:PA notified.    Dorena Bodo

## 2021-12-18 NOTE — Progress Notes (Signed)
Hypoglycemic Event  CBG: 65  Treatment: D50 25 mL (12.5 gm)  Symptoms: None  Follow-up CBG: Time:83 CBG Result:82  Possible Reasons for Event: Inadequate meal intake pt refuses to eat/ tube feed off     Teodoro Spray

## 2021-12-18 NOTE — Inpatient Diabetes Management (Signed)
Inpatient Diabetes Program Recommendations  AACE/ADA: New Consensus Statement on Inpatient Glycemic Control (2015)  Target Ranges:  Prepandial:   less than 140 mg/dL      Peak postprandial:   less than 180 mg/dL (1-2 hours)      Critically ill patients:  140 - 180 mg/dL   Lab Results  Component Value Date   GLUCAP 87 12/18/2021   HGBA1C 5.6 11/18/2021    Latest Reference Range & Units 12/18/21 00:56 12/18/21 05:16 12/18/21 08:00 12/18/21 08:48 12/18/21 12:25  Glucose-Capillary 70 - 99 mg/dL 91 106 (H) 55 (L) 101 (H) 87  (H): Data is abnormally high (L): Data is abnormally low  Inpatient Diabetes Program Recommendations:   Noted hypoglycemia. Please consider: -Decrease Novolog correction to 0-6 units q 4 hrs. -Discontinue tube feed coverage for now until CBGs >180  Thank you, Bethena Roys E. Yari Szeliga, RN, MSN, CDE  Diabetes Coordinator Inpatient Glycemic Control Team Team Pager [phone] (8am-5pm) 12/18/2021 2:43 PM
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
## 2021-12-18 NOTE — Progress Notes (Addendum)
Hypoglycemic Event  12/17/21 @ 1945  CBG: 66  Treatment: D50 25 mL (12.5 gm)  Symptoms: None   Follow-up CBG: Time:82 CBG Result:91  Possible Reasons for Event: Inadequate meal intake pt refuses to eat/confused/     Bill Brooks
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
## 2021-12-18 NOTE — Progress Notes (Signed)
Patient has remained agitate for entirety of shift. He continues to try and get out of bed and does not listen to being redirected. He waxes and wanes with being combative. He has still been attempting to hit and kick when staff is near him for any type of medication or patient care. PRN medication administered as able.

## 2021-12-18 NOTE — Plan of Care (Signed)
  Problem: Health Behavior/Discharge Planning: Goal: Ability to manage health-related needs will improve Outcome: Not Progressing   
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
## 2021-12-18 NOTE — TOC Progression Note (Signed)
Transition of Care (TOC) - Progression Note    Patient Details  Name: Bill Brooks. MRN: [id number] Date of Birth: 09/19/1960  Transition of Care [facility]) CM/SW Contact  Ella Bodo, RN Phone Number: 12/18/2021, 3:55 PM  Clinical Narrative:    Patient has been faxed out for SNF bed search; currently there are no bed offers.  Will provide updates as available.  Expected Discharge Plan: [place] Barriers to Discharge: Continued Medical Work up  Expected Discharge Plan and Services Expected Discharge Plan: [name]   Discharge Planning Services: CM Consult Post Acute Care Choice: IP Rehab Living arrangements for the past 2 months: Apartment                                       Social Determinants of Health (SDOH) Interventions    Readmission Risk Interventions    01/11/2020   10:04 AM  Readmission Risk Prevention Plan  Post Dischage Appt Complete  Medication Screening Complete  Transportation Screening Complete   Reinaldo Raddle, RN, BSN  Trauma/Neuro ICU Case Manager [phone]

## 2021-12-19 ENCOUNTER — Inpatient Hospital Stay (HOSPITAL_COMMUNITY): Payer: Self-pay

## 2021-12-19 HISTORY — PX: IR GJ TUBE CHANGE: IMG1440

## 2021-12-19 HISTORY — PX: IR GASTR TUBE CONVERT GASTR-JEJ PER W/FL MOD SED: IMG2332

## 2021-12-19 LAB — GLUCOSE, CAPILLARY
Glucose-Capillary: 102 mg/dL — ABNORMAL HIGH (ref 70–99)
Glucose-Capillary: 76 mg/dL (ref 70–99)
Glucose-Capillary: 83 mg/dL (ref 70–99)
Glucose-Capillary: 86 mg/dL (ref 70–99)
Glucose-Capillary: 90 mg/dL (ref 70–99)
Glucose-Capillary: 92 mg/dL (ref 70–99)
Glucose-Capillary: 93 mg/dL (ref 70–99)

## 2021-12-19 MED ORDER — LIDOCAINE VISCOUS HCL 2 % MT SOLN
OROMUCOSAL | Status: AC
  Administered 2021-12-19: 5 mL
  Filled 2021-12-19: qty 15

## 2021-12-19 MED ORDER — IOHEXOL 300 MG/ML  SOLN
50.0000 mL | Freq: Once | INTRAMUSCULAR | Status: AC | PRN
  Administered 2021-12-19: 30 mL
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
## 2021-12-19 NOTE — Progress Notes (Signed)
PT Cancellation Note  Patient Details Name: Bill Brooks. MRN: [id number] DOB: 05-09-1960   Cancelled Treatment:    Reason Eval/Treat Not Completed: Patient at procedure or test/unavailable   Wyona Almas, PT, DPT Acute Rehabilitation Services Office [place] 12/19/2021, 9:34 AM

## 2021-12-19 NOTE — TOC Progression Note (Addendum)
Transition of Care (TOC) - Progression Note    Patient Details  Name: Bill Brooks. MRN: [id number] Date of Birth: [birth year]  Transition of Care [facility]) CM/SW Contact  Ella Bodo, RN Phone Number: 12/19/2021, 2:06 PM  Clinical Narrative:    Received call from patient's sister, Threasa Beards, regarding progress on SNF placement.  Currently we have no bed offers for patient.  Sister states she has received phone calls from financial counselor regarding Medicaid application, but she states she did not receive a number to call back.  Threasa Beards given [employer] account planner contact information for follow up.    12/19/21 Addendum 2:37pm Spoke with admissions coordinators at [facility] and [facility], requesting review for potential admission.     Expected Discharge Plan: [place] Barriers to Discharge: Continued Medical Work up  Expected Discharge Plan and Services Expected Discharge Plan: [place]   Discharge Planning Services: CM Consult Post Acute Care Choice: IP Rehab Living arrangements for the past 2 months: Apartment                                       Social Determinants of Health (SDOH) Interventions    Readmission Risk Interventions    01/11/2020   10:04 AM  Readmission Risk Prevention Plan  Post Dischage Appt Complete  Medication Screening Complete  Transportation Screening Complete   Reinaldo Raddle, RN, BSN  Trauma/Neuro ICU Case Manager [phone]

## 2021-12-19 NOTE — Procedures (Signed)
Interventional Radiology Procedure Note  Procedure: FLUORO GTUBE CONVERSION TO 24FR GJ TUBE    Complications: None  Estimated Blood Loss:  0  Findings: READY FOR USE    M. TREVOR Tremon Sainvil, MD    
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
## 2021-12-20 ENCOUNTER — Encounter (HOSPITAL_COMMUNITY): Payer: Self-pay

## 2021-12-20 LAB — BASIC METABOLIC PANEL
Anion gap: 6 (ref 5–15)
BUN: 9 mg/dL (ref 8–23)
CO2: 25 mmol/L (ref 22–32)
Calcium: 8.8 mg/dL — ABNORMAL LOW (ref 8.9–10.3)
Chloride: 107 mmol/L (ref 98–111)
Creatinine, Ser: 0.87 mg/dL (ref 0.61–1.24)
GFR, Estimated: >60 mL/min (ref >60)
Glucose, Bld: 122 mg/dL — ABNORMAL HIGH (ref 70–99)
Potassium: 3.4 mmol/L — ABNORMAL LOW (ref 3.5–5.1)
Sodium: 138 mmol/L (ref 135–145)

## 2021-12-20 LAB — CBC
HCT: 27.5 % — ABNORMAL LOW (ref 39.0–52.0)
Hemoglobin: 8.8 g/dL — ABNORMAL LOW (ref 13.0–17.0)
MCH: 27.2 pg (ref 26.0–34.0)
MCHC: 32 g/dL (ref 30.0–36.0)
MCV: 84.9 fL (ref 80.0–100.0)
Platelets: 454 10*3/uL — ABNORMAL HIGH (ref 150–400)
RBC: 3.24 MIL/uL — ABNORMAL LOW (ref 4.22–5.81)
RDW: 15.8 % — ABNORMAL HIGH (ref 11.5–15.5)
WBC: 7.3 10*3/uL (ref 4.0–10.5)
nRBC: 0 % (ref 0.0–0.2)

## 2021-12-20 LAB — URINE CULTURE: Culture: 10000 — AB

## 2021-12-20 LAB — GLUCOSE, CAPILLARY
Glucose-Capillary: 101 mg/dL — ABNORMAL HIGH (ref 70–99)
Glucose-Capillary: 104 mg/dL — ABNORMAL HIGH (ref 70–99)
Glucose-Capillary: 106 mg/dL — ABNORMAL HIGH (ref 70–99)
Glucose-Capillary: 112 mg/dL — ABNORMAL HIGH (ref 70–99)
Glucose-Capillary: 122 mg/dL — ABNORMAL HIGH (ref 70–99)

## 2021-12-20 MED ORDER — QUETIAPINE FUMARATE 300 MG PO TABS
300.0000 mg | ORAL_TABLET | Freq: Every day | ORAL | Status: DC
  Administered 2021-12-20 – 2022-03-11 (×80): 300 mg
  Filled 2021-12-20: qty 1
  Filled 2021-12-20 (×3): qty 6
  Filled 2021-12-20: qty 1
  Filled 2021-12-20 (×5): qty 6
  Filled 2021-12-20: qty 1
  Filled 2021-12-20 (×4): qty 6
  Filled 2021-12-20: qty 1
  Filled 2021-12-20: qty 6
  Filled 2021-12-20 (×2): qty 1
  Filled 2021-12-20 (×2): qty 6
  Filled 2021-12-20 (×4): qty 1
  Filled 2021-12-20: qty 6
  Filled 2021-12-20: qty 1
  Filled 2021-12-20 (×6): qty 6
  Filled 2021-12-20: qty 1
  Filled 2021-12-20: qty 6
  Filled 2021-12-20 (×2): qty 1
  Filled 2021-12-20 (×5): qty 6
  Filled 2021-12-20: qty 1
  Filled 2021-12-20: qty 6
  Filled 2021-12-20: qty 1
  Filled 2021-12-20: qty 6
  Filled 2021-12-20: qty 1
  Filled 2021-12-20: qty 6
  Filled 2021-12-20 (×2): qty 1
  Filled 2021-12-20: qty 6
  Filled 2021-12-20: qty 1
  Filled 2021-12-20: qty 6
  Filled 2021-12-20: qty 1
  Filled 2021-12-20: qty 6
  Filled 2021-12-20 (×3): qty 1
  Filled 2021-12-20: qty 6
  Filled 2021-12-20: qty 1
  Filled 2021-12-20: qty 6
  Filled 2021-12-20 (×3): qty 1
  Filled 2021-12-20: qty 6
  Filled 2021-12-20 (×3): qty 1
  Filled 2021-12-20 (×2): qty 6
  Filled 2021-12-20: qty 1
  Filled 2021-12-20 (×3): qty 6
  Filled 2021-12-20: qty 1
  Filled 2021-12-20 (×5): qty 6
  Filled 2021-12-20: qty 1
  Filled 2021-12-20: qty 6

## 2021-12-20 MED ORDER — PANTOPRAZOLE 2 MG/ML SUSPENSION
40.0000 mg | Freq: Two times a day (BID) | ORAL | Status: DC
  Administered 2021-12-20 – 2022-01-02 (×27): 40 mg
  Filled 2021-12-20 (×27): qty 20

## 2021-12-20 MED ORDER — METOPROLOL TARTRATE 25 MG/10 ML ORAL SUSPENSION: 25.0000 mg | Freq: Two times a day (BID) | ORAL | Status: DC

## 2021-12-20 MED ORDER — VITAMIN D 25 MCG (1000 UNIT) PO TABS: 1000.0000 [IU] | ORAL_TABLET | Freq: Every day | ORAL | Status: DC

## 2021-12-20 MED ORDER — QUETIAPINE FUMARATE 50 MG PO TABS
100.0000 mg | ORAL_TABLET | Freq: Every morning | ORAL | Status: DC
  Administered 2021-12-21 – 2022-02-24 (×64): 100 mg
  Filled 2021-12-20 (×64): qty 2

## 2021-12-20 MED ORDER — PANTOPRAZOLE 2 MG/ML SUSPENSION: 40.0000 mg | Freq: Two times a day (BID) | ORAL | Status: DC

## 2021-12-20 MED ORDER — THIAMINE MONONITRATE 100 MG PO TABS
100.0000 mg | ORAL_TABLET | Freq: Every day | ORAL | Status: DC
  Administered 2021-12-20 – 2022-02-28 (×71): 100 mg
  Filled 2021-12-20 (×71): qty 1

## 2021-12-20 MED ORDER — METOPROLOL TARTRATE 25 MG/10 ML ORAL SUSPENSION
25.0000 mg | Freq: Two times a day (BID) | ORAL | Status: DC
  Administered 2021-12-20 – 2022-02-11 (×93): 25 mg
  Filled 2021-12-20 (×111): qty 10

## 2021-12-20 MED ORDER — THIAMINE MONONITRATE 100 MG PO TABS: 100.0000 mg | ORAL_TABLET | Freq: Every day | ORAL | Status: DC

## 2021-12-20 MED ORDER — VITAMIN D 25 MCG (1000 UNIT) PO TABS
1000.0000 [IU] | ORAL_TABLET | Freq: Every day | ORAL | Status: DC
  Administered 2021-12-20 – 2022-03-12 (×83): 1000 [IU]
  Filled 2021-12-20 (×83): qty 1

## 2021-12-20 MED ORDER — CLONAZEPAM 0.5 MG PO TABS: 2.0000 mg | ORAL_TABLET | Freq: Two times a day (BID) | ORAL | Status: DC

## 2021-12-20 MED ORDER — FOLIC ACID 1 MG PO TABS
1.0000 mg | ORAL_TABLET | Freq: Every day | ORAL | Status: DC
  Administered 2021-12-20 – 2022-03-12 (×82): 1 mg
  Filled 2021-12-20 (×82): qty 1

## 2021-12-20 MED ORDER — FOLIC ACID 1 MG PO TABS: 1.0000 mg | ORAL_TABLET | Freq: Every day | ORAL | Status: DC

## 2021-12-20 MED ORDER — ADULT MULTIVITAMIN LIQUID CH
15.0000 mL | Freq: Every day | ORAL | Status: DC
  Administered 2021-12-20 – 2022-01-01 (×13): 15 mL
  Filled 2021-12-20 (×13): qty 15

## 2021-12-20 MED ORDER — METHOCARBAMOL 500 MG PO TABS
1000.0000 mg | ORAL_TABLET | Freq: Three times a day (TID) | ORAL | Status: DC
  Administered 2021-12-20 – 2022-01-15 (×77): 1000 mg
  Filled 2021-12-20 (×77): qty 2

## 2021-12-20 MED ORDER — GUAIFENESIN 100 MG/5ML PO LIQD
15.0000 mL | Freq: Four times a day (QID) | ORAL | Status: DC
  Administered 2021-12-20 – 2021-12-29 (×34): 15 mL
  Filled 2021-12-20 (×37): qty 15

## 2021-12-20 MED ORDER — CLONAZEPAM 0.5 MG PO TABS
2.0000 mg | ORAL_TABLET | Freq: Two times a day (BID) | ORAL | Status: DC
  Administered 2021-12-20 – 2021-12-21 (×3): 2 mg
  Filled 2021-12-20 (×3): qty 4

## 2021-12-20 MED ORDER — ENSURE ENLIVE PO LIQD: 237.0000 mL | Freq: Two times a day (BID) | ORAL | Status: DC

## 2021-12-20 MED ORDER — ACETAMINOPHEN 500 MG PO TABS
1000.0000 mg | ORAL_TABLET | Freq: Four times a day (QID) | ORAL | Status: DC
  Administered 2021-12-20 – 2022-01-20 (×112): 1000 mg
  Filled 2021-12-20 (×123): qty 2

## 2021-12-20 MED ORDER — POTASSIUM CHLORIDE 10 MEQ/100ML IV SOLN
10.0000 meq | INTRAVENOUS | Status: AC
  Administered 2021-12-20 (×4): 10 meq via INTRAVENOUS
  Filled 2021-12-20 (×4): qty 100
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
## 2021-12-21 LAB — BASIC METABOLIC PANEL
Anion gap: 8 (ref 5–15)
BUN: 9 mg/dL (ref 8–23)
CO2: 24 mmol/L (ref 22–32)
Calcium: 9 mg/dL (ref 8.9–10.3)
Chloride: 107 mmol/L (ref 98–111)
Creatinine, Ser: 0.77 mg/dL (ref 0.61–1.24)
GFR, Estimated: >60 mL/min (ref >60)
Glucose, Bld: 121 mg/dL — ABNORMAL HIGH (ref 70–99)
Potassium: 3.9 mmol/L (ref 3.5–5.1)
Sodium: 139 mmol/L (ref 135–145)

## 2021-12-21 LAB — GLUCOSE, CAPILLARY
Glucose-Capillary: 108 mg/dL — ABNORMAL HIGH (ref 70–99)
Glucose-Capillary: 113 mg/dL — ABNORMAL HIGH (ref 70–99)
Glucose-Capillary: 114 mg/dL — ABNORMAL HIGH (ref 70–99)
Glucose-Capillary: 52 mg/dL — ABNORMAL LOW (ref 70–99)
Glucose-Capillary: 54 mg/dL — ABNORMAL LOW (ref 70–99)
Glucose-Capillary: 88 mg/dL (ref 70–99)
Glucose-Capillary: 94 mg/dL (ref 70–99)
Glucose-Capillary: 94 mg/dL (ref 70–99)

## 2021-12-21 MED ORDER — CLONAZEPAM 0.5 MG PO TABS
2.0000 mg | ORAL_TABLET | Freq: Three times a day (TID) | ORAL | Status: DC
  Administered 2021-12-21 – 2022-01-19 (×88): 2 mg
  Filled 2021-12-21 (×91): qty 4

## 2021-12-21 MED ORDER — DEXTROSE 50 % IV SOLN
25.0000 g | INTRAVENOUS | Status: AC
  Administered 2021-12-21: 25 g via INTRAVENOUS
  Filled 2021-12-21: qty 50
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
## 2021-12-21 NOTE — Inpatient Diabetes Management (Signed)
Inpatient Diabetes Program Recommendations  AACE/ADA: New Consensus Statement on Inpatient Glycemic Control (2015)  Target Ranges:  Prepandial:   less than 140 mg/dL      Peak postprandial:   less than 180 mg/dL (1-2 hours)      Critically ill patients:  140 - 180 mg/dL   Lab Results  Component Value Date   GLUCAP 52 (L) 12/21/2021   HGBA1C 5.6 11/18/2021    Review of Glycemic Control  Latest Reference Range & Units 12/21/21 04:34 12/21/21 07:19 12/21/21 07:35  Glucose-Capillary 70 - 99 mg/dL 108 (H) 54 (L) 52 (L)  (H): Data is abnormally high (L): Data is abnormally low  Current orders for Inpatient glycemic control:  Pro-source 60 ml/hr Novolog 0-15 Q4H Novolog 2 units Q4H  Inpatient Diabetes Program Recommendations:    Please discontinue tube feed coverage Novolog 2 units Q4H for now as patient is running low and had hypoglycemia this am.    Will continue to follow while inpatient.  Thank you, Reche Dixon, MSN, [place] Diabetes Coordinator Inpatient Diabetes Program [phone] (team pager from 8a-5p)'

## 2021-12-21 NOTE — TOC Progression Note (Addendum)
Transition of Care (TOC) - Progression Note    Patient Details  Name: Bill Brooks. MRN: [id number] Date of Birth: 1961-01-30  Transition of Care [facility]) CM/SW Contact  [name], RN Phone Number: 12/21/2021, 10:10am  Clinical Narrative:    Spoke with Debbie at [facility], asking for further review for possible LOG; she states she will review and call back.   Spoke with [place] at [facility], and they currently cannot accept LOG.    Addendum: 1600 Left message for Debbie at [facility], requesting follow up.  Also left message for [facility] Admissions requesting call back.     Expected Discharge Plan: [place] Barriers to Discharge: Continued Medical Work up  Expected Discharge Plan and Services Expected Discharge Plan: [place]   Discharge Planning Services: CM Consult Post Acute Care Choice: IP Rehab Living arrangements for the past 2 months: Apartment                                       Social Determinants of Health (SDOH) Interventions    Readmission Risk Interventions    01/11/2020   10:04 AM  Readmission Risk Prevention Plan  Post Dischage Appt Complete  Medication Screening Complete  Transportation Screening Complete   Reinaldo Raddle, RN, BSN  Trauma/Neuro ICU Case Manager [phone]
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
## 2021-12-22 ENCOUNTER — Inpatient Hospital Stay (HOSPITAL_COMMUNITY): Payer: Self-pay

## 2021-12-22 LAB — GLUCOSE, CAPILLARY
Glucose-Capillary: 104 mg/dL — ABNORMAL HIGH (ref 70–99)
Glucose-Capillary: 119 mg/dL — ABNORMAL HIGH (ref 70–99)
Glucose-Capillary: 150 mg/dL — ABNORMAL HIGH (ref 70–99)
Glucose-Capillary: 78 mg/dL (ref 70–99)
Glucose-Capillary: 84 mg/dL (ref 70–99)
Glucose-Capillary: 88 mg/dL (ref 70–99)
Glucose-Capillary: 99 mg/dL (ref 70–99)

## 2021-12-22 MED ORDER — FREE WATER
100.0000 mL | Freq: Four times a day (QID) | Status: DC
  Administered 2021-12-22 – 2021-12-30 (×28): 100 mL
  Administered 2021-12-30: 30 mL
  Administered 2021-12-30 – 2022-01-15 (×52): 100 mL

## 2021-12-22 NOTE — Progress Notes (Signed)
OT Cancellation Note  Patient Details Name: Bill Brooks. MRN: [id number] DOB: 1960/11/05   Cancelled Treatment:    Reason Eval/Treat Not Completed: Patient's level of consciousness (Pt recently received Ativan fron Nursing just prior to therapy arrival. Unable to wake up to participate in OT tx session.)  Ailene Ravel, OTR/L,CBIS  Supplemental OT - [place] and [facility]  12/22/2021, 12:49 PM
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
## 2021-12-22 NOTE — Progress Notes (Signed)
PT Cancellation Note  Patient Details Name: Bill Brooks. MRN: [id number] DOB: 1960/09/21   Cancelled Treatment:    Reason Eval/Treat Not Completed: Fatigue/lethargy limiting ability to participate.  Lethargic from Ativan, but did get clarification that BUE's are WBAT for use of RW.   Ramond Dial 12/22/2021, 12:05 PM  Mee Hives, PT PhD Acute Rehab Dept. Number: [place]

## 2021-12-22 NOTE — Progress Notes (Signed)
SLP Cancellation Note  Patient Details Name: Bill Brooks. MRN: [id number] DOB: [birth year]   Cancelled treatment:        Nursing currently in working with pt. Will continue efforts.    Houston Siren 12/22/2021, 10:58 AM

## 2021-12-22 NOTE — TOC Progression Note (Signed)
Transition of Care (TOC) - Progression Note    Patient Details  Name: Bill Brooks. MRN: [id number] Date of Birth: [birth year]  Transition of Care [facility]) CM/SW Contact  [name], RN Phone Number: 12/22/2021, 10:10am  Clinical Narrative:    Received call from [place] with [facility]); she states facility is reviewing patient.  She states she will notify Case Manager with update.    Expected Discharge Plan: [place] Barriers to Discharge: Continued Medical Work up  Expected Discharge Plan and Services Expected Discharge Plan: [place]   Discharge Planning Services: CM Consult Post Acute Care Choice: IP Rehab Living arrangements for the past 2 months: Apartment                                       Social Determinants of Health (SDOH) Interventions    Readmission Risk Interventions    01/11/2020   10:04 AM  Readmission Risk Prevention Plan  Post Dischage Appt Complete  Medication Screening Complete  Transportation Screening Complete   Reinaldo Raddle, RN, BSN  Trauma/Neuro ICU Case Manager [phone]

## 2021-12-23 LAB — BASIC METABOLIC PANEL
Anion gap: 8 (ref 5–15)
BUN: 10 mg/dL (ref 8–23)
CO2: 24 mmol/L (ref 22–32)
Calcium: 9.2 mg/dL (ref 8.9–10.3)
Chloride: 105 mmol/L (ref 98–111)
Creatinine, Ser: 0.77 mg/dL (ref 0.61–1.24)
GFR, Estimated: >60 mL/min (ref >60)
Glucose, Bld: 108 mg/dL — ABNORMAL HIGH (ref 70–99)
Potassium: 4 mmol/L (ref 3.5–5.1)
Sodium: 137 mmol/L (ref 135–145)

## 2021-12-23 LAB — GLUCOSE, CAPILLARY
Glucose-Capillary: 103 mg/dL — ABNORMAL HIGH (ref 70–99)
Glucose-Capillary: 108 mg/dL — ABNORMAL HIGH (ref 70–99)
Glucose-Capillary: 116 mg/dL — ABNORMAL HIGH (ref 70–99)
Glucose-Capillary: 127 mg/dL — ABNORMAL HIGH (ref 70–99)
Glucose-Capillary: 88 mg/dL (ref 70–99)
Glucose-Capillary: 96 mg/dL (ref 70–99)
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
## 2021-12-24 LAB — GLUCOSE, CAPILLARY
Glucose-Capillary: 104 mg/dL — ABNORMAL HIGH (ref 70–99)
Glucose-Capillary: 129 mg/dL — ABNORMAL HIGH (ref 70–99)
Glucose-Capillary: 95 mg/dL (ref 70–99)
Glucose-Capillary: 86 mg/dL (ref 70–99)
Glucose-Capillary: 77 mg/dL (ref 70–99)

## 2021-12-24 MED ORDER — DEXTROSE 5 % IV SOLN: INTRAVENOUS | Status: DC
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
## 2021-12-24 NOTE — Progress Notes (Signed)
Patient's J tube clogged and unable to  put medications or tube feeds through port. Notified MD. Per MD, hold tube feeds and give all medication through g tube.

## 2021-12-25 LAB — GLUCOSE, CAPILLARY
Glucose-Capillary: 105 mg/dL — ABNORMAL HIGH (ref 70–99)
Glucose-Capillary: 109 mg/dL — ABNORMAL HIGH (ref 70–99)
Glucose-Capillary: 112 mg/dL — ABNORMAL HIGH (ref 70–99)
Glucose-Capillary: 112 mg/dL — ABNORMAL HIGH (ref 70–99)
Glucose-Capillary: 120 mg/dL — ABNORMAL HIGH (ref 70–99)
Glucose-Capillary: 88 mg/dL (ref 70–99)
Glucose-Capillary: 99 mg/dL (ref 70–99)

## 2021-12-25 MED ORDER — TAMSULOSIN HCL 0.4 MG PO CAPS
0.4000 mg | ORAL_CAPSULE | Freq: Every day | ORAL | Status: DC
  Administered 2021-12-25 – 2022-03-16 (×80): 0.4 mg via ORAL
  Filled 2021-12-25 (×82): qty 1

## 2021-12-25 MED ORDER — SODIUM BICARBONATE 650 MG PO TABS
650.0000 mg | ORAL_TABLET | Freq: Once | ORAL | Status: AC
  Administered 2021-12-25: 650 mg
  Filled 2021-12-25: qty 1

## 2021-12-25 MED ORDER — PANCRELIPASE (LIP-PROT-AMYL) 10440-39150 UNITS PO TABS
20880.0000 [IU] | ORAL_TABLET | Freq: Once | ORAL | Status: AC
  Administered 2021-12-25: 20880 [IU]
  Filled 2021-12-25 (×2): qty 2
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
## 2021-12-25 NOTE — Progress Notes (Signed)
J tube still clogged, all meds given to G tube.

## 2021-12-25 NOTE — Progress Notes (Signed)
Attempt 3x to flush pt j-tube with lukewarm water and the lipase with bicarbonate and nothing has work. G-tube is working and flushing well. Notified PA, he place order for IR to evaluated tube.
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
## 2021-12-26 ENCOUNTER — Inpatient Hospital Stay (HOSPITAL_COMMUNITY): Payer: Self-pay

## 2021-12-26 HISTORY — PX: IR RADIOLOGIST EVAL & MGMT: IMG5224

## 2021-12-26 LAB — GLUCOSE, CAPILLARY
Glucose-Capillary: 89 mg/dL (ref 70–99)
Glucose-Capillary: 97 mg/dL (ref 70–99)
Glucose-Capillary: 95 mg/dL (ref 70–99)
Glucose-Capillary: 85 mg/dL (ref 70–99)
Glucose-Capillary: 89 mg/dL (ref 70–99)
Glucose-Capillary: 72 mg/dL (ref 70–99)

## 2021-12-26 NOTE — Progress Notes (Signed)
Jtube clogged.  Amplatz wire and declogger inserted into tube and pushed through, flushed with green medallion and 60cc toomey syringe.  Flushed freely with no resistance.   Scharlene Corn  RT         R

## 2021-12-26 NOTE — Progress Notes (Signed)
On and off restless and agitation overnight. PRN meds given. J tube still clogged. All meds given to G tube.

## 2021-12-26 NOTE — Progress Notes (Signed)
SLP Cancellation Note  Patient Details Name: Bill Brooks. MRN: [id number] DOB: 10/22/1960   Cancelled treatment:       Reason Eval/Treat Not Completed: Patient at procedure or test/unavailable   Osie Bond., M.A. [place] Office [phone]  Secure chat preferred  12/26/2021, 10:06 AM

## 2021-12-26 NOTE — Progress Notes (Signed)
Patient transported off unit via bed by transportation staff to get his J-tube exchanged.
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
## 2021-12-27 LAB — GLUCOSE, CAPILLARY
Glucose-Capillary: 73 mg/dL (ref 70–99)
Glucose-Capillary: 87 mg/dL (ref 70–99)
Glucose-Capillary: 90 mg/dL (ref 70–99)
Glucose-Capillary: 73 mg/dL (ref 70–99)
Glucose-Capillary: 68 mg/dL — ABNORMAL LOW (ref 70–99)

## 2021-12-27 MED ORDER — DEXTROSE 50 % IV SOLN
12.5000 g | INTRAVENOUS | Status: AC
  Administered 2021-12-27: 12.5 g via INTRAVENOUS
  Filled 2021-12-27: qty 50
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
## 2021-12-27 NOTE — TOC Progression Note (Signed)
Transition of Care (TOC) - Progression Note    Patient Details  Name: Bill Brooks. MRN: [id number] Date of Birth: June 24, 1960  Transition of [facility]) CM/SW Contact  Ella Bodo, RN Phone Number: 12/27/2021, 4:40 PM  Clinical Narrative:    Still no SNF bed offers for patient.  TOC Case Manager has left multiple messages for [facility], requesting follow up.    Expected Discharge Plan: [place] Barriers to Discharge: Continued Medical Work up  Expected Discharge Plan and Services Expected Discharge Plan: [place]   Discharge Planning Services: CM Consult Post Acute Care Choice: IP Rehab Living arrangements for the past 2 months: Apartment                                       Social Determinants of Health (SDOH) Interventions    Readmission Risk Interventions    01/11/2020   10:04 AM  Readmission Risk Prevention Plan  Post Dischage Appt Complete  Medication Screening Complete  Transportation Screening Complete   Reinaldo Raddle, RN, BSN  Trauma/Neuro ICU Case Manager [phone]

## 2021-12-28 LAB — GLUCOSE, CAPILLARY
Glucose-Capillary: 100 mg/dL — ABNORMAL HIGH (ref 70–99)
Glucose-Capillary: 110 mg/dL — ABNORMAL HIGH (ref 70–99)
Glucose-Capillary: 116 mg/dL — ABNORMAL HIGH (ref 70–99)
Glucose-Capillary: 120 mg/dL — ABNORMAL HIGH (ref 70–99)
Glucose-Capillary: 124 mg/dL — ABNORMAL HIGH (ref 70–99)
Glucose-Capillary: 94 mg/dL (ref 70–99)
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
## 2021-12-29 LAB — GLUCOSE, CAPILLARY
Glucose-Capillary: 105 mg/dL — ABNORMAL HIGH (ref 70–99)
Glucose-Capillary: 107 mg/dL — ABNORMAL HIGH (ref 70–99)
Glucose-Capillary: 113 mg/dL — ABNORMAL HIGH (ref 70–99)
Glucose-Capillary: 113 mg/dL — ABNORMAL HIGH (ref 70–99)
Glucose-Capillary: 123 mg/dL — ABNORMAL HIGH (ref 70–99)
Glucose-Capillary: 125 mg/dL — ABNORMAL HIGH (ref 70–99)
Glucose-Capillary: 133 mg/dL — ABNORMAL HIGH (ref 70–99)
Glucose-Capillary: 136 mg/dL — ABNORMAL HIGH (ref 70–99)
Glucose-Capillary: 97 mg/dL (ref 70–99)

## 2021-12-29 MED ORDER — LORAZEPAM 0.5 MG PO TABS
0.5000 mg | ORAL_TABLET | ORAL | Status: DC | PRN
  Administered 2021-12-31 – 2022-01-20 (×6): 1 mg via ORAL
  Filled 2021-12-29 (×6): qty 2

## 2021-12-29 MED ORDER — HALOPERIDOL LACTATE 5 MG/ML IJ SOLN
2.0000 mg | Freq: Four times a day (QID) | INTRAMUSCULAR | Status: DC | PRN
  Administered 2022-01-22 – 2022-03-06 (×21): 2 mg via INTRAMUSCULAR
  Filled 2021-12-29 (×22): qty 1
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
## 2021-12-29 NOTE — Progress Notes (Addendum)
SLP Cancellation Note  Patient Details Name: Bill Brooks. MRN: [id number] DOB: 10-Sep-1960   Cancelled treatment:        Attempted to see for cognition and dysphagia therapy. Maximum cueing and he was unable to arouse safely to attempt po's. Just received his Seroquel prior to SLP arrival. Progress notes state his po intake continues to be minimal. Will continue attempts.  Could nocturnal feeds and/or decreasing rate be considered to promote hunger?  Addendum:  Spoke with RD re: nocturnal feeds who states pt has been on nocturnal feeds prior and did not make any significant difference re: his intake.   Houston Siren 12/29/2021, 9:56 AM

## 2021-12-30 LAB — GLUCOSE, CAPILLARY
Glucose-Capillary: 161 mg/dL — ABNORMAL HIGH (ref 70–99)
Glucose-Capillary: 129 mg/dL — ABNORMAL HIGH (ref 70–99)
Glucose-Capillary: 161 mg/dL — ABNORMAL HIGH (ref 70–99)
Glucose-Capillary: 141 mg/dL — ABNORMAL HIGH (ref 70–99)
Glucose-Capillary: 131 mg/dL — ABNORMAL HIGH (ref 70–99)
Glucose-Capillary: 131 mg/dL — ABNORMAL HIGH (ref 70–99)

## 2021-12-30 LAB — BASIC METABOLIC PANEL
Anion gap: 9 (ref 5–15)
BUN: 18 mg/dL (ref 8–23)
CO2: 24 mmol/L (ref 22–32)
Calcium: 9.4 mg/dL (ref 8.9–10.3)
Chloride: 98 mmol/L (ref 98–111)
Creatinine, Ser: 0.99 mg/dL (ref 0.61–1.24)
GFR, Estimated: >60 mL/min (ref >60)
Glucose, Bld: 143 mg/dL — ABNORMAL HIGH (ref 70–99)
Potassium: 4.1 mmol/L (ref 3.5–5.1)
Sodium: 131 mmol/L — ABNORMAL LOW (ref 135–145)

## 2021-12-30 NOTE — Progress Notes (Signed)
Per Dr Barry Dienes, tube feeds stopped at this time. Will resume TF @ goal rate @ 0600 10/29.
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
## 2021-12-31 DIAGNOSIS — J439 Emphysema, unspecified: Secondary | ICD-10-CM | POA: Insufficient documentation

## 2021-12-31 DIAGNOSIS — S065XAA Traumatic subdural hemorrhage with loss of consciousness status unknown, initial encounter: Secondary | ICD-10-CM | POA: Diagnosis present

## 2021-12-31 DIAGNOSIS — F1410 Cocaine abuse, uncomplicated: Secondary | ICD-10-CM | POA: Insufficient documentation

## 2021-12-31 DIAGNOSIS — S37031A Laceration of right kidney, unspecified degree, initial encounter: Secondary | ICD-10-CM | POA: Insufficient documentation

## 2021-12-31 DIAGNOSIS — J9600 Acute respiratory failure, unspecified whether with hypoxia or hypercapnia: Secondary | ICD-10-CM | POA: Insufficient documentation

## 2021-12-31 DIAGNOSIS — F10129 Alcohol abuse with intoxication, unspecified: Secondary | ICD-10-CM | POA: Insufficient documentation

## 2021-12-31 DIAGNOSIS — F05 Delirium due to known physiological condition: Secondary | ICD-10-CM | POA: Insufficient documentation

## 2021-12-31 LAB — BASIC METABOLIC PANEL WITH GFR
Anion gap: 11 (ref 5–15)
BUN: 25 mg/dL — ABNORMAL HIGH (ref 8–23)
CO2: 24 mmol/L (ref 22–32)
Calcium: 9.5 mg/dL (ref 8.9–10.3)
Chloride: 97 mmol/L — ABNORMAL LOW (ref 98–111)
Creatinine, Ser: 1.33 mg/dL — ABNORMAL HIGH (ref 0.61–1.24)
GFR, Estimated: >60 mL/min
Glucose, Bld: 103 mg/dL — ABNORMAL HIGH (ref 70–99)
Potassium: 4.3 mmol/L (ref 3.5–5.1)
Sodium: 132 mmol/L — ABNORMAL LOW (ref 135–145)

## 2021-12-31 LAB — GLUCOSE, CAPILLARY
Glucose-Capillary: 127 mg/dL — ABNORMAL HIGH (ref 70–99)
Glucose-Capillary: 101 mg/dL — ABNORMAL HIGH (ref 70–99)
Glucose-Capillary: 105 mg/dL — ABNORMAL HIGH (ref 70–99)
Glucose-Capillary: 102 mg/dL — ABNORMAL HIGH (ref 70–99)
Glucose-Capillary: 91 mg/dL (ref 70–99)
Glucose-Capillary: 129 mg/dL — ABNORMAL HIGH (ref 70–99)

## 2021-12-31 MED ORDER — SODIUM CHLORIDE 0.9 % IV SOLN
125.0000 mg | Freq: Once | INTRAVENOUS | Status: AC
  Administered 2021-12-31: 125 mg via INTRAVENOUS
  Filled 2021-12-31: qty 10

## 2021-12-31 MED ORDER — OSMOLITE 1.2 CAL PO LIQD
1000.0000 mL | ORAL | Status: DC
  Administered 2021-12-31 (×2): 1000 mL via JEJUNOSTOMY
  Filled 2021-12-31 (×3): qty 1000

## 2021-12-31 MED ORDER — ERYTHROMYCIN ETHYLSUCCINATE 400 MG/5ML PO SUSR
400.0000 mg | Freq: Three times a day (TID) | ORAL | Status: AC
  Administered 2021-12-31 – 2022-01-03 (×9): 400 mg
  Filled 2021-12-31 (×9): qty 5

## 2021-12-31 MED ORDER — PANCRELIPASE (LIP-PROT-AMYL) 10440-39150 UNITS PO TABS
20880.0000 [IU] | ORAL_TABLET | Freq: Once | ORAL | Status: AC
  Administered 2021-12-31: 20880 [IU]
  Filled 2021-12-31: qty 2

## 2021-12-31 MED ORDER — SODIUM BICARBONATE 650 MG PO TABS
650.0000 mg | ORAL_TABLET | Freq: Once | ORAL | Status: AC
  Administered 2021-12-31: 650 mg
  Filled 2021-12-31: qty 1
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
## 2022-01-01 LAB — GLUCOSE, CAPILLARY
Glucose-Capillary: 101 mg/dL — ABNORMAL HIGH (ref 70–99)
Glucose-Capillary: 115 mg/dL — ABNORMAL HIGH (ref 70–99)
Glucose-Capillary: 125 mg/dL — ABNORMAL HIGH (ref 70–99)
Glucose-Capillary: 130 mg/dL — ABNORMAL HIGH (ref 70–99)
Glucose-Capillary: 132 mg/dL — ABNORMAL HIGH (ref 70–99)
Glucose-Capillary: 142 mg/dL — ABNORMAL HIGH (ref 70–99)
Glucose-Capillary: 148 mg/dL — ABNORMAL HIGH (ref 70–99)

## 2022-01-01 MED ORDER — ADULT MULTIVITAMIN W/MINERALS CH
1.0000 | ORAL_TABLET | Freq: Every day | ORAL | Status: DC
  Administered 2022-01-02 – 2022-03-12 (×70): 1
  Filled 2022-01-01 (×71): qty 1

## 2022-01-01 MED ORDER — JEVITY 1.5 CAL/FIBER PO LIQD
1000.0000 mL | ORAL | Status: AC
  Administered 2022-01-01 – 2022-01-15 (×11): 1000 mL
  Filled 2022-01-01 (×22): qty 1000
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
## 2022-01-02 LAB — GLUCOSE, CAPILLARY
Glucose-Capillary: 104 mg/dL — ABNORMAL HIGH (ref 70–99)
Glucose-Capillary: 142 mg/dL — ABNORMAL HIGH (ref 70–99)
Glucose-Capillary: 139 mg/dL — ABNORMAL HIGH (ref 70–99)
Glucose-Capillary: 113 mg/dL — ABNORMAL HIGH (ref 70–99)
Glucose-Capillary: 131 mg/dL — ABNORMAL HIGH (ref 70–99)

## 2022-01-02 MED ORDER — FAMOTIDINE 20 MG PO TABS: 20.0000 mg | ORAL_TABLET | Freq: Two times a day (BID) | ORAL | Status: DC

## 2022-01-02 MED ORDER — FAMOTIDINE 40 MG/5ML PO SUSR
20.0000 mg | Freq: Two times a day (BID) | ORAL | Status: DC
  Administered 2022-01-02 – 2022-03-12 (×136): 20 mg
  Filled 2022-01-02 (×146): qty 2.5
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
## 2022-01-03 LAB — GLUCOSE, CAPILLARY
Glucose-Capillary: 107 mg/dL — ABNORMAL HIGH (ref 70–99)
Glucose-Capillary: 109 mg/dL — ABNORMAL HIGH (ref 70–99)
Glucose-Capillary: 115 mg/dL — ABNORMAL HIGH (ref 70–99)
Glucose-Capillary: 117 mg/dL — ABNORMAL HIGH (ref 70–99)
Glucose-Capillary: 120 mg/dL — ABNORMAL HIGH (ref 70–99)
Glucose-Capillary: 124 mg/dL — ABNORMAL HIGH (ref 70–99)
Glucose-Capillary: 98 mg/dL (ref 70–99)
Glucose-Capillary: 99 mg/dL (ref 70–99)
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
## 2022-01-03 NOTE — Progress Notes (Signed)
Pt left hand is swollen with plus 2 edema, he is c/o tenderness in the wrist. Was able to wash pt hand and place mitten back on pt.
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
## 2022-01-04 ENCOUNTER — Inpatient Hospital Stay (HOSPITAL_COMMUNITY): Payer: Self-pay

## 2022-01-04 LAB — GLUCOSE, CAPILLARY
Glucose-Capillary: 96 mg/dL (ref 70–99)
Glucose-Capillary: 129 mg/dL — ABNORMAL HIGH (ref 70–99)
Glucose-Capillary: 100 mg/dL — ABNORMAL HIGH (ref 70–99)
Glucose-Capillary: 94 mg/dL (ref 70–99)
Glucose-Capillary: 93 mg/dL (ref 70–99)
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
## 2022-01-05 LAB — BASIC METABOLIC PANEL
Anion gap: 9 (ref 5–15)
BUN: 20 mg/dL (ref 8–23)
CO2: 24 mmol/L (ref 22–32)
Calcium: 9.8 mg/dL (ref 8.9–10.3)
Chloride: 102 mmol/L (ref 98–111)
Creatinine, Ser: 0.89 mg/dL (ref 0.61–1.24)
GFR, Estimated: >60 mL/min (ref >60)
Glucose, Bld: 136 mg/dL — ABNORMAL HIGH (ref 70–99)
Potassium: 4.2 mmol/L (ref 3.5–5.1)
Sodium: 135 mmol/L (ref 135–145)

## 2022-01-05 LAB — GLUCOSE, CAPILLARY
Glucose-Capillary: 117 mg/dL — ABNORMAL HIGH (ref 70–99)
Glucose-Capillary: 150 mg/dL — ABNORMAL HIGH (ref 70–99)
Glucose-Capillary: 89 mg/dL (ref 70–99)
Glucose-Capillary: 91 mg/dL (ref 70–99)
Glucose-Capillary: 94 mg/dL (ref 70–99)
Glucose-Capillary: 96 mg/dL (ref 70–99)
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
## 2022-01-05 NOTE — Progress Notes (Signed)
OT Cancellation Note  Patient Details Name: Bill Brooks. MRN: [id number] DOB: June 27, 1960   Cancelled Treatment:    Reason Eval/Treat Not Completed: Other (comment).  Patient with RN, not agitated, but "not happy" at the moment.  OT will try back as time allows.    Bill Brooks 01/05/2022, 5:03 PM 01/05/2022  RP, OTR/L  Acute Rehabilitation Services  Office:  [phone]

## 2022-01-06 LAB — GLUCOSE, CAPILLARY
Glucose-Capillary: 101 mg/dL — ABNORMAL HIGH (ref 70–99)
Glucose-Capillary: 102 mg/dL — ABNORMAL HIGH (ref 70–99)
Glucose-Capillary: 103 mg/dL — ABNORMAL HIGH (ref 70–99)
Glucose-Capillary: 110 mg/dL — ABNORMAL HIGH (ref 70–99)
Glucose-Capillary: 145 mg/dL — ABNORMAL HIGH (ref 70–99)
Glucose-Capillary: 64 mg/dL — ABNORMAL LOW (ref 70–99)
Glucose-Capillary: 90 mg/dL (ref 70–99)

## 2022-01-06 MED ORDER — DEXTROSE 50 % IV SOLN
12.5000 g | INTRAVENOUS | Status: AC
  Administered 2022-01-06: 12.5 g via INTRAVENOUS

## 2022-01-06 MED ORDER — DEXTROSE 50 % IV SOLN
INTRAVENOUS | Status: AC
  Filled 2022-01-06: qty 50

## 2022-01-06 MED ORDER — SODIUM BICARBONATE 650 MG PO TABS
650.0000 mg | ORAL_TABLET | Freq: Once | ORAL | Status: AC
  Administered 2022-01-08: 650 mg
  Filled 2022-01-06: qty 1

## 2022-01-06 MED ORDER — PANCRELIPASE (LIP-PROT-AMYL) 10440-39150 UNITS PO TABS
20880.0000 [IU] | ORAL_TABLET | Freq: Once | ORAL | Status: AC
  Administered 2022-01-08: 20880 [IU]
  Filled 2022-01-06 (×2): qty 2

## 2022-01-06 NOTE — Progress Notes (Signed)
Attempted to unclog feeding tube x2 unsuccessful. Unable to administer Viokace and Bicarb per protocol order set. Trauma RN made aware. No new orders given.

## 2022-01-06 NOTE — Progress Notes (Signed)
Tube feeding clogged, unable to flush. Tube feedings stopped. Trauma nurse on call, Thurmond Butts RN, paged and made aware. Verbal orders given to stop tube feeding, recheck cbg in 2 hours. Bobbye Morton, MD made aware.
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
## 2022-01-07 LAB — GLUCOSE, CAPILLARY
Glucose-Capillary: 109 mg/dL — ABNORMAL HIGH (ref 70–99)
Glucose-Capillary: 124 mg/dL — ABNORMAL HIGH (ref 70–99)
Glucose-Capillary: 124 mg/dL — ABNORMAL HIGH (ref 70–99)
Glucose-Capillary: 127 mg/dL — ABNORMAL HIGH (ref 70–99)
Glucose-Capillary: 139 mg/dL — ABNORMAL HIGH (ref 70–99)
Glucose-Capillary: 80 mg/dL (ref 70–99)
Glucose-Capillary: 95 mg/dL (ref 70–99)
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
## 2022-01-07 NOTE — Progress Notes (Signed)
Got new order for restraints this morning. Presently patient is sleeping and not trying to get out of bed. Will monitor.

## 2022-01-07 NOTE — Progress Notes (Signed)
MD notified of expired restraints and patients continued agitation; no renewal of orders for tonight. Bilateral soft wrist restraints removed.
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
## 2022-01-08 LAB — GLUCOSE, CAPILLARY
Glucose-Capillary: 109 mg/dL — ABNORMAL HIGH (ref 70–99)
Glucose-Capillary: 117 mg/dL — ABNORMAL HIGH (ref 70–99)
Glucose-Capillary: 71 mg/dL (ref 70–99)
Glucose-Capillary: 73 mg/dL (ref 70–99)
Glucose-Capillary: 75 mg/dL (ref 70–99)
Glucose-Capillary: 98 mg/dL (ref 70–99)

## 2022-01-08 MED ORDER — ENSURE ENLIVE PO LIQD: 237.0000 mL | Freq: Two times a day (BID) | ORAL | Status: DC

## 2022-01-08 MED ORDER — SODIUM CHLORIDE 0.45 % IV SOLN: INTRAVENOUS | Status: DC

## 2022-01-08 NOTE — Plan of Care (Signed)
  Problem: Education: Goal: Knowledge of General Education information will improve Description Including pain rating scale, medication(s)/side effects and non-pharmacologic comfort measures Outcome: Progressing   Problem: Health Behavior/Discharge Planning: Goal: Ability to manage health-related needs will improve Outcome: Progressing   

## 2022-01-08 NOTE — Plan of Care (Signed)
  Problem: Education: Goal: Knowledge of General Education information will improve Description: Including pain rating scale, medication(s)/side effects and non-pharmacologic comfort measures 01/08/2022 1437 by Santa Lighter, RN Outcome: Progressing 01/08/2022 1436 by Santa Lighter, RN Outcome: Progressing   Problem: Health Behavior/Discharge Planning: Goal: Ability to manage health-related needs will improve Outcome: Progressing

## 2022-01-08 NOTE — Progress Notes (Signed)
Patient rested comfortable all shift. No attempts made to get out of bed and no attempts to pull out IV or feeding tube so no restraints applied during shift.

## 2022-01-08 NOTE — Progress Notes (Signed)
PT Cancellation Note  Patient Details Name: Bill Brooks. MRN: [id number] DOB: 1960/06/30   Cancelled Treatment:    Reason Eval/Treat Not Completed: Fatigue/lethargy limiting ability to participate this afternoon, stating he needed to rest. Pt noted to be soiled of urine but continued to decline all offers and attempts to replace linens. RN staff alerted. Will continue to attempt mobility progression as tolerated.   West Carbo, PT, DPT   Acute Rehabilitation Department   Sandra Cockayne 01/08/2022, 4:15 PM
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
## 2022-01-09 LAB — BASIC METABOLIC PANEL
Anion gap: 13 (ref 5–15)
BUN: 25 mg/dL — ABNORMAL HIGH (ref 8–23)
CO2: 23 mmol/L (ref 22–32)
Calcium: 10.3 mg/dL (ref 8.9–10.3)
Chloride: 100 mmol/L (ref 98–111)
Creatinine, Ser: 1.07 mg/dL (ref 0.61–1.24)
GFR, Estimated: >60 mL/min (ref >60)
Glucose, Bld: 98 mg/dL (ref 70–99)
Potassium: 4 mmol/L (ref 3.5–5.1)
Sodium: 136 mmol/L (ref 135–145)

## 2022-01-09 LAB — GLUCOSE, CAPILLARY
Glucose-Capillary: 104 mg/dL — ABNORMAL HIGH (ref 70–99)
Glucose-Capillary: 110 mg/dL — ABNORMAL HIGH (ref 70–99)
Glucose-Capillary: 70 mg/dL (ref 70–99)
Glucose-Capillary: 77 mg/dL (ref 70–99)
Glucose-Capillary: 79 mg/dL (ref 70–99)
Glucose-Capillary: 82 mg/dL (ref 70–99)
Glucose-Capillary: 88 mg/dL (ref 70–99)

## 2022-01-09 MED ORDER — DOCUSATE SODIUM 100 MG PO CAPS
100.0000 mg | ORAL_CAPSULE | Freq: Two times a day (BID) | ORAL | Status: DC
  Administered 2022-01-10 – 2022-01-20 (×16): 100 mg via ORAL
  Filled 2022-01-09 (×20): qty 1

## 2022-01-09 MED ORDER — ENSURE ENLIVE PO LIQD
237.0000 mL | Freq: Three times a day (TID) | ORAL | Status: DC
  Administered 2022-01-10 – 2022-01-18 (×10): 237 mL via ORAL

## 2022-01-09 MED ORDER — POLYETHYLENE GLYCOL 3350 17 G PO PACK
17.0000 g | PACK | Freq: Every day | ORAL | Status: DC
  Administered 2022-01-09 – 2022-01-20 (×12): 17 g via ORAL
  Filled 2022-01-09 (×12): qty 1
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
## 2022-01-09 NOTE — Progress Notes (Signed)
Per night shift nurse, patient not having tube feeds at the moment. Yesterday day shift nurse reported that tube was not working. MD is aware.
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
## 2022-01-09 NOTE — Progress Notes (Signed)
Nutrition Brief Note   RD received a consult to start a calorie count to assess pt PO intake. RD reached out to RN to hang envelope on door and start calorie count with breakfast on 01/09/22. RD will follow-up with day 1 results tomorrow.  Hermina Barters RD, LDN Clinical Dietitian See Shea Evans for contact information.
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
## 2022-01-09 NOTE — Progress Notes (Signed)
Patient not having tube feed, when asked day shift nurse said tube has not been working and that MD is aware of it.

## 2022-01-10 LAB — GLUCOSE, CAPILLARY
Glucose-Capillary: 88 mg/dL (ref 70–99)
Glucose-Capillary: 82 mg/dL (ref 70–99)
Glucose-Capillary: 109 mg/dL — ABNORMAL HIGH (ref 70–99)
Glucose-Capillary: 77 mg/dL (ref 70–99)
Glucose-Capillary: 84 mg/dL (ref 70–99)
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
## 2022-01-10 NOTE — Progress Notes (Signed)
TF started via G tube @ 56mL/hr, will advance by 32mL per hour to 30mL then stop per [facility], PA order. Ensure given per G tube also. Patient tolerated well.

## 2022-01-11 ENCOUNTER — Inpatient Hospital Stay (HOSPITAL_COMMUNITY): Payer: Self-pay

## 2022-01-11 LAB — GLUCOSE, CAPILLARY
Glucose-Capillary: 113 mg/dL — ABNORMAL HIGH (ref 70–99)
Glucose-Capillary: 100 mg/dL — ABNORMAL HIGH (ref 70–99)
Glucose-Capillary: 84 mg/dL (ref 70–99)

## 2022-01-11 MED ORDER — DIATRIZOATE MEGLUMINE & SODIUM 66-10 % PO SOLN
30.0000 mL | Freq: Once | ORAL | Status: AC
  Administered 2022-01-11: 25 mL
  Filled 2022-01-11: qty 30

## 2022-01-11 MED ORDER — LORAZEPAM 2 MG/ML IJ SOLN: 1.0000 mg | Freq: Four times a day (QID) | INTRAMUSCULAR | Status: DC | PRN

## 2022-01-11 NOTE — Progress Notes (Signed)
Soft wrist restraint order expired; on call MD notified; however she said she was woken up by page three nights in a row for the order expiring on night shift and she would rather it expire on day shift. Order would not be put in until day shift. Restraints removed for rest of shift; patient tolerated well.
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
## 2022-01-11 NOTE — Progress Notes (Signed)
Lillia Abed, NT called front desk and stated that she heard a loud sound that had came from the patients room and him yelling help. Jasmin, RN and Pieter Partridge, RN went down to the room and found the patient laying face down in feces and leaked contents from his GJ tube. Chrissie Noa, NT, Megan,RN, Velva Harman and [place], RN all came into the room and we got the patient up and cleaned and back into bed, patient was talking during all this. Jasmin, RN paged the PA Cowan who notified Dr. Janee Morn about the incident and he came to the bedside to assess the patient. Pt's GJ tube had been pulled from when he fell as well has he had a small raised area on his forehead. Pt bed alarm was turned on and socks placed on him. MD placed scans.

## 2022-01-12 ENCOUNTER — Encounter (HOSPITAL_COMMUNITY): Payer: Self-pay

## 2022-01-12 LAB — GLUCOSE, CAPILLARY
Glucose-Capillary: 74 mg/dL (ref 70–99)
Glucose-Capillary: 82 mg/dL (ref 70–99)
Glucose-Capillary: 83 mg/dL (ref 70–99)
Glucose-Capillary: 87 mg/dL (ref 70–99)
Glucose-Capillary: 88 mg/dL (ref 70–99)
Glucose-Capillary: 97 mg/dL (ref 70–99)
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
## 2022-01-13 LAB — GLUCOSE, CAPILLARY
Glucose-Capillary: 100 mg/dL — ABNORMAL HIGH (ref 70–99)
Glucose-Capillary: 104 mg/dL — ABNORMAL HIGH (ref 70–99)
Glucose-Capillary: 110 mg/dL — ABNORMAL HIGH (ref 70–99)
Glucose-Capillary: 118 mg/dL — ABNORMAL HIGH (ref 70–99)
Glucose-Capillary: 122 mg/dL — ABNORMAL HIGH (ref 70–99)
Glucose-Capillary: 90 mg/dL (ref 70–99)

## 2022-01-13 NOTE — Plan of Care (Signed)
  Problem: Education: Goal: Knowledge of General Education information will improve Description Including pain rating scale, medication(s)/side effects and non-pharmacologic comfort measures Outcome: Progressing   Problem: Health Behavior/Discharge Planning: Goal: Ability to manage health-related needs will improve Outcome: Progressing   
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
## 2022-01-14 LAB — GLUCOSE, CAPILLARY
Glucose-Capillary: 94 mg/dL (ref 70–99)
Glucose-Capillary: 123 mg/dL — ABNORMAL HIGH (ref 70–99)
Glucose-Capillary: 91 mg/dL (ref 70–99)
Glucose-Capillary: 120 mg/dL — ABNORMAL HIGH (ref 70–99)
Glucose-Capillary: 112 mg/dL — ABNORMAL HIGH (ref 70–99)
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
## 2022-01-15 LAB — GLUCOSE, CAPILLARY
Glucose-Capillary: 106 mg/dL — ABNORMAL HIGH (ref 70–99)
Glucose-Capillary: 120 mg/dL — ABNORMAL HIGH (ref 70–99)
Glucose-Capillary: 125 mg/dL — ABNORMAL HIGH (ref 70–99)
Glucose-Capillary: 85 mg/dL (ref 70–99)
Glucose-Capillary: 87 mg/dL (ref 70–99)
Glucose-Capillary: 98 mg/dL (ref 70–99)

## 2022-01-15 MED ORDER — FREE WATER
165.0000 mL | Status: DC
  Administered 2022-01-15 – 2022-02-13 (×165): 165 mL

## 2022-01-15 MED ORDER — METHOCARBAMOL 500 MG PO TABS
1000.0000 mg | ORAL_TABLET | Freq: Three times a day (TID) | ORAL | Status: DC | PRN
  Administered 2022-01-19 – 2022-03-12 (×8): 1000 mg
  Filled 2022-01-15 (×8): qty 2

## 2022-01-15 MED ORDER — JEVITY 1.5 CAL/FIBER PO LIQD
1000.0000 mL | ORAL | Status: DC
  Administered 2022-01-16 – 2022-01-18 (×3): 1000 mL
  Filled 2022-01-15 (×6): qty 1000
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
## 2022-01-15 NOTE — Progress Notes (Signed)
This Rn tried to encourage patient to eat some of his food. Patient refused to eat any of his lunch and told myself to get out of him room and not come back.

## 2022-01-16 LAB — GLUCOSE, CAPILLARY
Glucose-Capillary: 111 mg/dL — ABNORMAL HIGH (ref 70–99)
Glucose-Capillary: 114 mg/dL — ABNORMAL HIGH (ref 70–99)
Glucose-Capillary: 114 mg/dL — ABNORMAL HIGH (ref 70–99)
Glucose-Capillary: 120 mg/dL — ABNORMAL HIGH (ref 70–99)
Glucose-Capillary: 139 mg/dL — ABNORMAL HIGH (ref 70–99)
Glucose-Capillary: 97 mg/dL (ref 70–99)
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
## 2022-01-16 NOTE — Plan of Care (Signed)
  Problem: Education: Goal: Knowledge of General Education information will improve Description: Including pain rating scale, medication(s)/side effects and non-pharmacologic comfort measures Outcome: Not Progressing   Problem: Health Behavior/Discharge Planning: Goal: Ability to manage health-related needs will improve Outcome: Not Progressing   Problem: Clinical Measurements: Goal: Will remain free from infection Outcome: Progressing Goal: Respiratory complications will improve Outcome: Progressing Goal: Cardiovascular complication will be avoided Outcome: Progressing

## 2022-01-17 ENCOUNTER — Inpatient Hospital Stay (HOSPITAL_COMMUNITY): Payer: Self-pay

## 2022-01-17 LAB — GLUCOSE, CAPILLARY
Glucose-Capillary: 102 mg/dL — ABNORMAL HIGH (ref 70–99)
Glucose-Capillary: 102 mg/dL — ABNORMAL HIGH (ref 70–99)
Glucose-Capillary: 108 mg/dL — ABNORMAL HIGH (ref 70–99)
Glucose-Capillary: 112 mg/dL — ABNORMAL HIGH (ref 70–99)
Glucose-Capillary: 113 mg/dL — ABNORMAL HIGH (ref 70–99)
Glucose-Capillary: 117 mg/dL — ABNORMAL HIGH (ref 70–99)

## 2022-01-17 NOTE — Plan of Care (Signed)
  Problem: Pain Managment: Goal: General experience of comfort will improve 01/17/2022 1108 by Merrilyn Puma, RN Outcome: Progressing 01/17/2022 1108 by Merrilyn Puma, RN Outcome: Progressing   Problem: Safety: Goal: Ability to remain free from injury will improve 01/17/2022 1108 by Merrilyn Puma, RN Outcome: Progressing 01/17/2022 1108 by Merrilyn Puma, RN Outcome: Progressing   Problem: Skin Integrity: Goal: Risk for impaired skin integrity will decrease 01/17/2022 1108 by Merrilyn Puma, RN Outcome: Progressing 01/17/2022 1108 by Merrilyn Puma, RN Outcome: Progressing
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
## 2022-01-17 NOTE — Progress Notes (Signed)
OT Cancellation Note  Patient Details Name: Bill Brooks. MRN: [id number] DOB: 1960/04/01   Cancelled Treatment:    Reason Eval/Treat Not Completed: Patient at procedure or test/ unavailable (Pt out of room at X-ray. Unable to see pt for OT treatment. Will re-attempt as time allows.)  Limmie Patricia, OTR/L,CBIS  Supplemental OT - [facility] and [facility]  01/17/2022, 10:12 AM

## 2022-01-17 NOTE — Progress Notes (Signed)
SLP Cancellation Note  Patient Details Name: Bill Brooks. MRN: [id number] DOB: 03/28/1960   Cancelled treatment:       Reason Eval/Treat Not Completed: Patient at procedure or test/unavailable. Few minutes after SLP arrival, pt requiring transport to xray. During brief discussion with sister in room, pt appears to be tolerating regular diet and has good intake when assisted at meals. Will f/u as able for treatment of dysphagia and cognitive functions.    Avie Echevaria, MA, CCC-SLP Acute Rehabilitation Services Office Number: [phone]  Paulette Blanch 01/17/2022, 9:59 AM
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
## 2022-01-18 LAB — GLUCOSE, CAPILLARY
Glucose-Capillary: 106 mg/dL — ABNORMAL HIGH (ref 70–99)
Glucose-Capillary: 126 mg/dL — ABNORMAL HIGH (ref 70–99)
Glucose-Capillary: 133 mg/dL — ABNORMAL HIGH (ref 70–99)
Glucose-Capillary: 139 mg/dL — ABNORMAL HIGH (ref 70–99)
Glucose-Capillary: 144 mg/dL — ABNORMAL HIGH (ref 70–99)
Glucose-Capillary: 144 mg/dL — ABNORMAL HIGH (ref 70–99)
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
## 2022-01-18 NOTE — TOC Progression Note (Signed)
Transition of Care (TOC) - Progression Note    Patient Details  Name: Bill Brooks. MRN: [id number] Date of Birth: [birth year]  Transition of Care [facility]) CM/SW Contact  Astrid Drafts Berna Spare, RN Phone Number: 01/18/2022, 4:21 PM  Clinical Narrative:    Spoke with admissions coordinator at [facility]; she states she does currently have availability for an LOG patient, but will have to review with her administrator and let me know the decision in the morning.   Also spoke with Reyne Dumas, administrator of [facility] facilities: she states she will review.  Faxed referral and recent notes to [facility] for her review. Will follow with updates as available.    Expected Discharge Plan: Skilled Nursing Facility Barriers to Discharge: Continued Medical Work up  Expected Discharge Plan and Services Expected Discharge Plan: Skilled Nursing Facility   Discharge Planning Services: CM Consult Post Acute Care Choice: IP Rehab Living arrangements for the past 2 months: Apartment                                       Social Determinants of Health (SDOH) Interventions    Readmission Risk Interventions    01/11/2020   10:04 AM  Readmission Risk Prevention Plan  Post Dischage Appt Complete  Medication Screening Complete  Transportation Screening Complete   Quintella Baton, RN, BSN  Trauma/Neuro ICU Case Manager [phone]
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
## 2022-01-19 ENCOUNTER — Inpatient Hospital Stay (HOSPITAL_COMMUNITY): Payer: Self-pay

## 2022-01-19 LAB — GLUCOSE, CAPILLARY
Glucose-Capillary: 113 mg/dL — ABNORMAL HIGH (ref 70–99)
Glucose-Capillary: 117 mg/dL — ABNORMAL HIGH (ref 70–99)
Glucose-Capillary: 121 mg/dL — ABNORMAL HIGH (ref 70–99)
Glucose-Capillary: 122 mg/dL — ABNORMAL HIGH (ref 70–99)
Glucose-Capillary: 123 mg/dL — ABNORMAL HIGH (ref 70–99)
Glucose-Capillary: 135 mg/dL — ABNORMAL HIGH (ref 70–99)
Glucose-Capillary: 142 mg/dL — ABNORMAL HIGH (ref 70–99)

## 2022-01-19 MED ORDER — JEVITY 1.5 CAL/FIBER PO LIQD
1000.0000 mL | ORAL | Status: DC
  Administered 2022-01-19 – 2022-02-07 (×21): 1000 mL
  Filled 2022-01-19: qty 1000
  Filled 2022-01-19: qty 1422
  Filled 2022-01-19 (×10): qty 1000
  Filled 2022-01-19 (×2): qty 1422
  Filled 2022-01-19 (×9): qty 1000
  Filled 2022-01-19: qty 1422
  Filled 2022-01-19 (×10): qty 1000

## 2022-01-19 NOTE — Progress Notes (Signed)
Patient refusing to eat his breakfast.
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
## 2022-01-19 NOTE — Progress Notes (Signed)
Patient refused to eat dinner.  Patient requesting a 40.
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
## 2022-01-19 NOTE — TOC Progression Note (Signed)
Transition of Care (TOC) - Progression Note    Patient Details  Name: Germaine Shenker. MRN: [id number] Date of Birth: 16-Mar-1960  Transition of Care [facility]) CM/SW Contact  Glennon Mac, RN Phone Number: 01/19/2022, 4:53 PM  Clinical Narrative:    Lacinda Axon has declined patient due to PSA and behavior issues. Left message for [facility] with [employer]; no return call.     Expected Discharge Plan: Skilled Nursing Facility Barriers to Discharge: Continued Medical Work up  Expected Discharge Plan and Services Expected Discharge Plan: Skilled Nursing Facility   Discharge Planning Services: CM Consult Post Acute Care Choice: IP Rehab Living arrangements for the past 2 months: Apartment                                       Social Determinants of Health (SDOH) Interventions    Readmission Risk Interventions    01/11/2020   10:04 AM  Readmission Risk Prevention Plan  Post Dischage Appt Complete  Medication Screening Complete  Transportation Screening Complete   Quintella Baton, RN, BSN  Trauma/Neuro ICU Case Manager [phone]
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
## 2022-01-19 NOTE — Progress Notes (Signed)
Patient refusing lunch. Patient states he does not eat here.

## 2022-01-20 DIAGNOSIS — E43 Unspecified severe protein-calorie malnutrition: Secondary | ICD-10-CM | POA: Insufficient documentation

## 2022-01-20 LAB — GLUCOSE, CAPILLARY
Glucose-Capillary: 103 mg/dL — ABNORMAL HIGH (ref 70–99)
Glucose-Capillary: 104 mg/dL — ABNORMAL HIGH (ref 70–99)
Glucose-Capillary: 110 mg/dL — ABNORMAL HIGH (ref 70–99)
Glucose-Capillary: 116 mg/dL — ABNORMAL HIGH (ref 70–99)
Glucose-Capillary: 131 mg/dL — ABNORMAL HIGH (ref 70–99)
Glucose-Capillary: 135 mg/dL — ABNORMAL HIGH (ref 70–99)
Glucose-Capillary: 185 mg/dL — ABNORMAL HIGH (ref 70–99)

## 2022-01-20 MED ORDER — LORAZEPAM 0.5 MG PO TABS
0.5000 mg | ORAL_TABLET | Freq: Three times a day (TID) | ORAL | Status: DC | PRN
  Administered 2022-01-20 – 2022-01-23 (×3): 0.5 mg via ORAL
  Filled 2022-01-20 (×3): qty 1

## 2022-01-20 MED ORDER — CLONAZEPAM 0.5 MG PO TABS
2.0000 mg | ORAL_TABLET | Freq: Two times a day (BID) | ORAL | Status: DC
  Administered 2022-01-20 – 2022-01-23 (×7): 2 mg
  Filled 2022-01-20 (×7): qty 4

## 2022-01-20 MED ORDER — DOCUSATE SODIUM 50 MG/5ML PO LIQD
100.0000 mg | Freq: Two times a day (BID) | ORAL | Status: DC
  Administered 2022-01-20 – 2022-03-12 (×90): 100 mg
  Filled 2022-01-20 (×91): qty 10

## 2022-01-20 MED ORDER — ACETAMINOPHEN 160 MG/5ML PO SOLN
650.0000 mg | Freq: Four times a day (QID) | ORAL | Status: DC
  Administered 2022-01-20 – 2022-03-13 (×194): 650 mg
  Filled 2022-01-20 (×191): qty 20.3

## 2022-01-20 MED ORDER — POLYETHYLENE GLYCOL 3350 17 G PO PACK
17.0000 g | PACK | Freq: Every day | ORAL | Status: DC
  Administered 2022-01-21 – 2022-01-22 (×2): 17 g
  Filled 2022-01-20 (×3): qty 1
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
## 2022-01-21 LAB — GLUCOSE, CAPILLARY
Glucose-Capillary: 122 mg/dL — ABNORMAL HIGH (ref 70–99)
Glucose-Capillary: 145 mg/dL — ABNORMAL HIGH (ref 70–99)
Glucose-Capillary: 113 mg/dL — ABNORMAL HIGH (ref 70–99)
Glucose-Capillary: 116 mg/dL — ABNORMAL HIGH (ref 70–99)
Glucose-Capillary: 130 mg/dL — ABNORMAL HIGH (ref 70–99)
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
## 2022-01-22 LAB — GLUCOSE, CAPILLARY
Glucose-Capillary: 115 mg/dL — ABNORMAL HIGH (ref 70–99)
Glucose-Capillary: 120 mg/dL — ABNORMAL HIGH (ref 70–99)
Glucose-Capillary: 121 mg/dL — ABNORMAL HIGH (ref 70–99)
Glucose-Capillary: 132 mg/dL — ABNORMAL HIGH (ref 70–99)
Glucose-Capillary: 144 mg/dL — ABNORMAL HIGH (ref 70–99)
Glucose-Capillary: 91 mg/dL (ref 70–99)

## 2022-01-22 MED ORDER — LORAZEPAM 2 MG/ML IJ SOLN
0.5000 mg | Freq: Once | INTRAMUSCULAR | Status: AC | PRN
  Administered 2022-01-22: 0.5 mg via INTRAVENOUS
  Filled 2022-01-22 (×2): qty 1

## 2022-01-22 MED ORDER — POLYETHYLENE GLYCOL 3350 17 G PO PACK
17.0000 g | PACK | Freq: Two times a day (BID) | ORAL | Status: DC
  Administered 2022-01-24 – 2022-02-15 (×31): 17 g
  Filled 2022-01-22 (×37): qty 1
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
## 2022-01-22 NOTE — Progress Notes (Signed)
Prn haldol administered for restlessness and agitation this morning
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
## 2022-01-22 NOTE — Progress Notes (Signed)
Abdominal binder ordered and will be applied as soon as it is available, requested from materials management
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
## 2022-01-23 LAB — GLUCOSE, CAPILLARY
Glucose-Capillary: 123 mg/dL — ABNORMAL HIGH (ref 70–99)
Glucose-Capillary: 139 mg/dL — ABNORMAL HIGH (ref 70–99)
Glucose-Capillary: 120 mg/dL — ABNORMAL HIGH (ref 70–99)
Glucose-Capillary: 113 mg/dL — ABNORMAL HIGH (ref 70–99)
Glucose-Capillary: 114 mg/dL — ABNORMAL HIGH (ref 70–99)
Glucose-Capillary: 99 mg/dL (ref 70–99)

## 2022-01-23 MED ORDER — LORAZEPAM 0.5 MG PO TABS
0.5000 mg | ORAL_TABLET | ORAL | Status: DC | PRN
  Administered 2022-01-24 – 2022-02-03 (×16): 0.5 mg via ORAL
  Filled 2022-01-23 (×16): qty 1
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
## 2022-01-23 NOTE — Progress Notes (Signed)
   01/23/22 1845  What Happened  Was fall witnessed? No  Was patient injured? No  Patient found on floor  Found by Staff-comment Myrlene Broker NT)  Provider Notification  Provider Name/Title Dr. Dossie Der  Date Provider Notified 01/23/22  Time Provider Notified 1922  Method of Notification Page  Notification Reason Fall  Provider response No new orders  Date of Provider Response 01/23/22  Time of Provider Response 1927  Adult Fall Risk Assessment  Risk Factor Category (scoring not indicated) High fall risk per protocol (document High fall risk)  Patient Fall Risk Level High fall risk  Adult Fall Risk Interventions  Required Bundle Interventions *See Row Information* High fall risk - low, moderate, and high requirements implemented  Additional Interventions Use of appropriate toileting equipment (bedpan, BSC, etc.)  Screening for Fall Injury Risk (To be completed on HIGH fall risk patients) - Assessing Need for Floor Mats  Risk For Fall Injury- Criteria for Floor Mats Previous fall this admission  Will Implement Floor Mats Yes  Vitals  Temp 98.5 F (36.9 C)  Temp Source Oral  BP 114/84  MAP (mmHg) 94  BP Location Right Arm  BP Method Automatic  Patient Position (if appropriate) Lying  Pulse Rate 96  Pulse Rate Source Monitor  Resp 16  Oxygen Therapy  SpO2 98 %  O2 Device Room Air  Pain Assessment  Pain Scale 0-10  Pain Score 0  PCA/Epidural/Spinal Assessment  Respiratory Pattern Regular;Unlabored  Neurological  Neuro (WDL) X  Level of Consciousness Alert  Orientation Level Oriented to person  Cognition Poor attention/concentration;Poor judgement;Poor safety awareness  Speech Clear  Glasgow Coma Scale  Eye Opening 4  Best Verbal Response (NON-intubated) 4  Best Motor Response 4  Glasgow Coma Scale Score 12  Musculoskeletal  Musculoskeletal (WDL) X  Assistive Device BSC  Generalized Weakness Yes  Integumentary  Integumentary (WDL) X  Abrasion Location Arm   Abrasion Location Orientation Bilateral  Abrasion Intervention Barrier cream  Ecchymosis Location Arm  Ecchymosis Location Orientation Bilateral
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
## 2022-01-23 NOTE — Progress Notes (Signed)
OT Cancellation Note  Patient Details Name: Bill Brooks. MRN: [id number] DOB: 07-03-1960   Cancelled Treatment:    Reason Eval/Treat Not Completed: Other (comment) (Spoke with Nurse to check on pt prior to attempting therapy session. Nurse states it would not be a good time to try. Pt was given Haldol @ 1125AM and Ativan @1230PM . Goals reviewed and updated; see care plan.)  , OTR/L,CBIS  Supplemental OT - [facility]  01/23/2022, 2:31 PM

## 2022-01-24 LAB — GLUCOSE, CAPILLARY
Glucose-Capillary: 105 mg/dL — ABNORMAL HIGH (ref 70–99)
Glucose-Capillary: 111 mg/dL — ABNORMAL HIGH (ref 70–99)
Glucose-Capillary: 120 mg/dL — ABNORMAL HIGH (ref 70–99)
Glucose-Capillary: 124 mg/dL — ABNORMAL HIGH (ref 70–99)
Glucose-Capillary: 127 mg/dL — ABNORMAL HIGH (ref 70–99)
Glucose-Capillary: 127 mg/dL — ABNORMAL HIGH (ref 70–99)
Glucose-Capillary: 134 mg/dL — ABNORMAL HIGH (ref 70–99)

## 2022-01-24 MED ORDER — CLONAZEPAM 0.5 MG PO TABS
2.0000 mg | ORAL_TABLET | Freq: Three times a day (TID) | ORAL | Status: DC
  Administered 2022-01-24 – 2022-02-05 (×37): 2 mg via ORAL
  Filled 2022-01-24 (×37): qty 4
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
## 2022-01-25 LAB — GLUCOSE, CAPILLARY
Glucose-Capillary: 109 mg/dL — ABNORMAL HIGH (ref 70–99)
Glucose-Capillary: 116 mg/dL — ABNORMAL HIGH (ref 70–99)
Glucose-Capillary: 121 mg/dL — ABNORMAL HIGH (ref 70–99)
Glucose-Capillary: 93 mg/dL (ref 70–99)
Glucose-Capillary: 96 mg/dL (ref 70–99)
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
## 2022-01-25 NOTE — Progress Notes (Signed)
Dr. Derrell Lolling informed that ankle restraint order soon to expire. New order received

## 2022-01-26 LAB — GLUCOSE, CAPILLARY
Glucose-Capillary: 102 mg/dL — ABNORMAL HIGH (ref 70–99)
Glucose-Capillary: 114 mg/dL — ABNORMAL HIGH (ref 70–99)
Glucose-Capillary: 123 mg/dL — ABNORMAL HIGH (ref 70–99)
Glucose-Capillary: 126 mg/dL — ABNORMAL HIGH (ref 70–99)
Glucose-Capillary: 131 mg/dL — ABNORMAL HIGH (ref 70–99)
Glucose-Capillary: 99 mg/dL (ref 70–99)
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
## 2022-01-27 LAB — GLUCOSE, CAPILLARY
Glucose-Capillary: 106 mg/dL — ABNORMAL HIGH (ref 70–99)
Glucose-Capillary: 131 mg/dL — ABNORMAL HIGH (ref 70–99)
Glucose-Capillary: 106 mg/dL — ABNORMAL HIGH (ref 70–99)
Glucose-Capillary: 110 mg/dL — ABNORMAL HIGH (ref 70–99)
Glucose-Capillary: 105 mg/dL — ABNORMAL HIGH (ref 70–99)

## 2022-01-27 NOTE — Progress Notes (Addendum)
Pt has bilateral ankle restraint applied. Md aware. Restraint order has been renew.

## 2022-01-27 NOTE — Plan of Care (Signed)
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
## 2022-01-28 LAB — GLUCOSE, CAPILLARY
Glucose-Capillary: 100 mg/dL — ABNORMAL HIGH (ref 70–99)
Glucose-Capillary: 119 mg/dL — ABNORMAL HIGH (ref 70–99)
Glucose-Capillary: 120 mg/dL — ABNORMAL HIGH (ref 70–99)
Glucose-Capillary: 122 mg/dL — ABNORMAL HIGH (ref 70–99)
Glucose-Capillary: 142 mg/dL — ABNORMAL HIGH (ref 70–99)
Glucose-Capillary: 77 mg/dL (ref 70–99)

## 2022-01-28 NOTE — Progress Notes (Signed)
Awake most of the time, no agitation, confused. Hallucination noted, like he keep saying to close the water in the bathroom. Re orient and re directed.
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
## 2022-01-29 LAB — GLUCOSE, CAPILLARY
Glucose-Capillary: 113 mg/dL — ABNORMAL HIGH (ref 70–99)
Glucose-Capillary: 117 mg/dL — ABNORMAL HIGH (ref 70–99)
Glucose-Capillary: 125 mg/dL — ABNORMAL HIGH (ref 70–99)
Glucose-Capillary: 130 mg/dL — ABNORMAL HIGH (ref 70–99)
Glucose-Capillary: 140 mg/dL — ABNORMAL HIGH (ref 70–99)

## 2022-01-29 MED ORDER — VALPROIC ACID 250 MG PO CAPS
250.0000 mg | ORAL_CAPSULE | Freq: Three times a day (TID) | ORAL | Status: DC
  Administered 2022-01-29 – 2022-02-01 (×11): 250 mg via ORAL
  Filled 2022-01-29 (×13): qty 1

## 2022-01-29 NOTE — Progress Notes (Signed)
PT Cancellation Note  Patient Details Name: Bill Brooks. MRN: [id number] DOB: 1960-06-29   Cancelled Treatment:    Reason Eval/Treat Not Completed: Other (comment) (pt more agitated, RN x2 in room to assist him with repositioning for safety, pt defers therapy but did allow staff to assist him to reposition with max encouragement.) +2 mod/maxA for posterior supine scooting toward [facility] for pt safety. Session time <8 minutes. Pt requesting PTA "to get fired tomorrow".   Dorathy Kinsman Yuridia Couts 01/29/2022, 6:10 PM
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
## 2022-01-30 LAB — GLUCOSE, CAPILLARY
Glucose-Capillary: 102 mg/dL — ABNORMAL HIGH (ref 70–99)
Glucose-Capillary: 115 mg/dL — ABNORMAL HIGH (ref 70–99)
Glucose-Capillary: 134 mg/dL — ABNORMAL HIGH (ref 70–99)
Glucose-Capillary: 142 mg/dL — ABNORMAL HIGH (ref 70–99)
Glucose-Capillary: 90 mg/dL (ref 70–99)
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
## 2022-01-30 NOTE — Progress Notes (Signed)
Awake and agitated overnight even with PRN med given, restraints is on, sitter at the bedside.
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
## 2022-01-31 LAB — GLUCOSE, CAPILLARY
Glucose-Capillary: 104 mg/dL — ABNORMAL HIGH (ref 70–99)
Glucose-Capillary: 109 mg/dL — ABNORMAL HIGH (ref 70–99)
Glucose-Capillary: 135 mg/dL — ABNORMAL HIGH (ref 70–99)
Glucose-Capillary: 92 mg/dL (ref 70–99)
Glucose-Capillary: 95 mg/dL (ref 70–99)
Glucose-Capillary: 96 mg/dL (ref 70–99)
Glucose-Capillary: 98 mg/dL (ref 70–99)
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
## 2022-02-01 LAB — CBC
HCT: 29.6 % — ABNORMAL LOW (ref 39.0–52.0)
Hemoglobin: 9.6 g/dL — ABNORMAL LOW (ref 13.0–17.0)
MCH: 27 pg (ref 26.0–34.0)
MCHC: 32.4 g/dL (ref 30.0–36.0)
MCV: 83.4 fL (ref 80.0–100.0)
Platelets: 284 10*3/uL (ref 150–400)
RBC: 3.55 MIL/uL — ABNORMAL LOW (ref 4.22–5.81)
RDW: 17 % — ABNORMAL HIGH (ref 11.5–15.5)
WBC: 7.6 10*3/uL (ref 4.0–10.5)
nRBC: 0 % (ref 0.0–0.2)

## 2022-02-01 LAB — COMPREHENSIVE METABOLIC PANEL
ALT: 67 U/L — ABNORMAL HIGH (ref 0–44)
AST: 38 U/L (ref 15–41)
Albumin: 3.3 g/dL — ABNORMAL LOW (ref 3.5–5.0)
Alkaline Phosphatase: 140 U/L — ABNORMAL HIGH (ref 38–126)
Anion gap: 11 (ref 5–15)
BUN: 30 mg/dL — ABNORMAL HIGH (ref 8–23)
CO2: 25 mmol/L (ref 22–32)
Calcium: 10 mg/dL (ref 8.9–10.3)
Chloride: 101 mmol/L (ref 98–111)
Creatinine, Ser: 0.93 mg/dL (ref 0.61–1.24)
GFR, Estimated: >60 mL/min (ref >60)
Glucose, Bld: 109 mg/dL — ABNORMAL HIGH (ref 70–99)
Potassium: 3.8 mmol/L (ref 3.5–5.1)
Sodium: 137 mmol/L (ref 135–145)
Total Bilirubin: 0.5 mg/dL (ref 0.3–1.2)
Total Protein: 7.8 g/dL (ref 6.5–8.1)

## 2022-02-01 LAB — GLUCOSE, CAPILLARY
Glucose-Capillary: 102 mg/dL — ABNORMAL HIGH (ref 70–99)
Glucose-Capillary: 113 mg/dL — ABNORMAL HIGH (ref 70–99)
Glucose-Capillary: 125 mg/dL — ABNORMAL HIGH (ref 70–99)
Glucose-Capillary: 134 mg/dL — ABNORMAL HIGH (ref 70–99)
Glucose-Capillary: 70 mg/dL (ref 70–99)

## 2022-02-01 NOTE — Progress Notes (Signed)
Restraint ordered needs to be renewed. Trixie Deis, PA made aware. Patient un-tied. Waiting for order renewal.
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
## 2022-02-01 NOTE — Progress Notes (Signed)
Patient pull out IV and peg tube from stomach. Bill Brooks made aware. Gave orders to put foley in hole and she will replace G tube in the morning.

## 2022-02-01 NOTE — Progress Notes (Signed)
Restraint order is now renewed.

## 2022-02-01 NOTE — Progress Notes (Addendum)
SLP Cancellation Note  Patient Details Name: Bill Brooks. MRN: [id number] DOB: 1960-10-26   Cancelled treatment:       Reason Eval/Treat Not Completed: Fatigue/lethargy limiting ability to participate; nursing reported pt awake most of night; unable to arouse to assess swallowing function; chart review revealed pt consistently refusing food/liquids and nutrition/hydration being maintained by PEG; Due to lack of progress/motivation over last several sessions and inability to arouse for assessing diet tolerance/completing tx, ST will s/o at this time; please re-consult prn.   Tressie Stalker, M.S., CCC-SLP 02/01/2022, 12:40 PM

## 2022-02-01 NOTE — Progress Notes (Signed)
Placed 14 French catheter with 10 cc balloon into hole where tube was and pulled taut to skin. Clamped tube and placed dressing under tube so it would not hurt abdominal skin. Placed ABD dressing over that and abd binder for protection. Old balloon intact on the tube that he pulled out.

## 2022-02-01 NOTE — Progress Notes (Signed)
Patient refused BS check.

## 2022-02-02 LAB — GLUCOSE, CAPILLARY
Glucose-Capillary: 104 mg/dL — ABNORMAL HIGH (ref 70–99)
Glucose-Capillary: 140 mg/dL — ABNORMAL HIGH (ref 70–99)
Glucose-Capillary: 93 mg/dL (ref 70–99)
Glucose-Capillary: 94 mg/dL (ref 70–99)
Glucose-Capillary: 98 mg/dL (ref 70–99)
Glucose-Capillary: 99 mg/dL (ref 70–99)

## 2022-02-02 LAB — VALPROIC ACID LEVEL: Valproic Acid Lvl: 17 ug/mL — ABNORMAL LOW (ref 50.0–100.0)

## 2022-02-02 MED ORDER — VALPROIC ACID 250 MG PO CAPS
500.0000 mg | ORAL_CAPSULE | Freq: Three times a day (TID) | ORAL | Status: DC
  Administered 2022-02-02 – 2022-02-05 (×7): 500 mg via ORAL
  Filled 2022-02-02 (×10): qty 2
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
## 2022-02-02 NOTE — TOC Progression Note (Signed)
Transition of Care (TOC) - Progression Note    Patient Details  Name: Gordy Goar. MRN: [id number] Date of Birth: 08-Jun-1960  [facility]) CM/SW Contact  Astrid Drafts Berna Spare, RN Phone Number: 02/02/2022, 4:47 PM  Clinical Narrative:    Left messages with Laverda Page at [facility] in [place] and [place] with [facility] in [place].  Spoke with Debbie at [facility] in [place]; will refax referrals, and she states she will review.    Expected Discharge Plan: Skilled Nursing Facility Barriers to Discharge: Continued Medical Work up  Expected Discharge Plan and Services Expected Discharge Plan: Skilled Nursing Facility   Discharge Planning Services: CM Consult Post Acute Care Choice: IP Rehab Living arrangements for the past 2 months: Apartment                                       Social Determinants of Health (SDOH) Interventions    Readmission Risk Interventions    01/11/2020   10:04 AM  Readmission Risk Prevention Plan  Post Dischage Appt Complete  Medication Screening Complete  Transportation Screening Complete   Quintella Baton, RN, BSN  Trauma/Neuro ICU Case Manager [phone]
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
## 2022-02-02 NOTE — Progress Notes (Signed)
PT Cancellation Note  Patient Details Name: Bill Brooks. MRN: [id number] DOB: Mar 14, 1960   Cancelled Treatment:    Reason Eval/Treat Not Completed: (P) Fatigue/lethargy limiting ability to participate (pt sleeping deeply, did not awaken to voice, per sitter he had recently fallen back asleep)   Dorathy Kinsman Geana Walts 02/02/2022, 6:05 PM

## 2022-02-03 LAB — GLUCOSE, CAPILLARY
Glucose-Capillary: 128 mg/dL — ABNORMAL HIGH (ref 70–99)
Glucose-Capillary: 95 mg/dL (ref 70–99)
Glucose-Capillary: 99 mg/dL (ref 70–99)
Glucose-Capillary: 119 mg/dL — ABNORMAL HIGH (ref 70–99)
Glucose-Capillary: 116 mg/dL — ABNORMAL HIGH (ref 70–99)
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
## 2022-02-04 LAB — GLUCOSE, CAPILLARY
Glucose-Capillary: 103 mg/dL — ABNORMAL HIGH (ref 70–99)
Glucose-Capillary: 109 mg/dL — ABNORMAL HIGH (ref 70–99)
Glucose-Capillary: 129 mg/dL — ABNORMAL HIGH (ref 70–99)
Glucose-Capillary: 130 mg/dL — ABNORMAL HIGH (ref 70–99)
Glucose-Capillary: 138 mg/dL — ABNORMAL HIGH (ref 70–99)
Glucose-Capillary: 151 mg/dL — ABNORMAL HIGH (ref 70–99)
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
## 2022-02-05 LAB — GLUCOSE, CAPILLARY
Glucose-Capillary: 108 mg/dL — ABNORMAL HIGH (ref 70–99)
Glucose-Capillary: 117 mg/dL — ABNORMAL HIGH (ref 70–99)
Glucose-Capillary: 141 mg/dL — ABNORMAL HIGH (ref 70–99)
Glucose-Capillary: 80 mg/dL (ref 70–99)
Glucose-Capillary: 87 mg/dL (ref 70–99)
Glucose-Capillary: 90 mg/dL (ref 70–99)
Glucose-Capillary: 92 mg/dL (ref 70–99)
Glucose-Capillary: 95 mg/dL (ref 70–99)

## 2022-02-05 MED ORDER — LORAZEPAM 0.5 MG PO TABS
0.5000 mg | ORAL_TABLET | ORAL | Status: DC | PRN
  Administered 2022-02-10 – 2022-03-12 (×14): 0.5 mg
  Filled 2022-02-05 (×15): qty 1

## 2022-02-05 MED ORDER — IBUPROFEN 100 MG/5ML PO SUSP
400.0000 mg | Freq: Three times a day (TID) | ORAL | Status: DC | PRN
  Administered 2022-02-12 – 2022-03-12 (×3): 400 mg
  Filled 2022-02-05 (×3): qty 20

## 2022-02-05 MED ORDER — VALPROIC ACID 250 MG/5ML PO SOLN
500.0000 mg | Freq: Three times a day (TID) | ORAL | Status: DC
  Administered 2022-02-05 – 2022-02-06 (×3): 500 mg
  Filled 2022-02-05 (×4): qty 10

## 2022-02-05 MED ORDER — SENNA 8.6 MG PO TABS: 2.0000 | ORAL_TABLET | Freq: Every day | ORAL | Status: DC | PRN

## 2022-02-05 MED ORDER — CLONAZEPAM 0.5 MG PO TABS
2.0000 mg | ORAL_TABLET | Freq: Three times a day (TID) | ORAL | Status: DC
  Administered 2022-02-05 – 2022-02-11 (×19): 2 mg
  Filled 2022-02-05 (×20): qty 4

## 2022-02-05 MED ORDER — DOCUSATE SODIUM 50 MG/5ML PO LIQD
100.0000 mg | Freq: Two times a day (BID) | ORAL | Status: DC | PRN
  Administered 2022-02-21 (×2): 100 mg
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
## 2022-02-06 LAB — BASIC METABOLIC PANEL
Anion gap: 6 (ref 5–15)
BUN: 24 mg/dL — ABNORMAL HIGH (ref 8–23)
CO2: 27 mmol/L (ref 22–32)
Calcium: 9.7 mg/dL (ref 8.9–10.3)
Chloride: 106 mmol/L (ref 98–111)
Creatinine, Ser: 0.88 mg/dL (ref 0.61–1.24)
GFR, Estimated: >60 mL/min (ref >60)
Glucose, Bld: 107 mg/dL — ABNORMAL HIGH (ref 70–99)
Potassium: 4 mmol/L (ref 3.5–5.1)
Sodium: 139 mmol/L (ref 135–145)

## 2022-02-06 LAB — GLUCOSE, CAPILLARY
Glucose-Capillary: 103 mg/dL — ABNORMAL HIGH (ref 70–99)
Glucose-Capillary: 107 mg/dL — ABNORMAL HIGH (ref 70–99)
Glucose-Capillary: 88 mg/dL (ref 70–99)
Glucose-Capillary: 89 mg/dL (ref 70–99)
Glucose-Capillary: 103 mg/dL — ABNORMAL HIGH (ref 70–99)
Glucose-Capillary: 115 mg/dL — ABNORMAL HIGH (ref 70–99)

## 2022-02-06 LAB — VITAMIN D 25 HYDROXY (VIT D DEFICIENCY, FRACTURES): Vit D, 25-Hydroxy: 65.32 ng/mL (ref 30–100)

## 2022-02-06 LAB — VALPROIC ACID LEVEL: Valproic Acid Lvl: 29 ug/mL — ABNORMAL LOW (ref 50.0–100.0)

## 2022-02-06 MED ORDER — VALPROIC ACID 250 MG/5ML PO SOLN
1000.0000 mg | Freq: Three times a day (TID) | ORAL | Status: DC
  Administered 2022-02-06 – 2022-02-28 (×65): 1000 mg
  Filled 2022-02-06 (×69): qty 20
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
## 2022-02-07 LAB — GLUCOSE, CAPILLARY
Glucose-Capillary: 96 mg/dL (ref 70–99)
Glucose-Capillary: 89 mg/dL (ref 70–99)
Glucose-Capillary: 69 mg/dL — ABNORMAL LOW (ref 70–99)
Glucose-Capillary: 91 mg/dL (ref 70–99)
Glucose-Capillary: 78 mg/dL (ref 70–99)
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
## 2022-02-07 NOTE — Plan of Care (Signed)
Problem: Education: Goal: Knowledge of General Education information will improve Description: Including pain rating scale, medication(s)/side effects and non-pharmacologic comfort measures 02/07/2022 0500 by Wynelle Beckmann, RN Outcome: Progressing 02/07/2022 0458 by Wynelle Beckmann, RN Outcome: Progressing   Problem: Health Behavior/Discharge Planning: Goal: Ability to manage health-related needs will improve 02/07/2022 0500 by Wynelle Beckmann, RN Outcome: Progressing 02/07/2022 0458 by Wynelle Beckmann, RN Outcome: Progressing   Problem: Clinical Measurements: Goal: Ability to maintain clinical measurements within normal limits will improve 02/07/2022 0500 by Wynelle Beckmann, RN Outcome: Progressing 02/07/2022 0458 by Wynelle Beckmann, RN Outcome: Progressing Goal: Will remain free from infection 02/07/2022 0500 by Wynelle Beckmann, RN Outcome: Progressing 02/07/2022 0458 by Wynelle Beckmann, RN Outcome: Progressing Goal: Diagnostic test results will improve 02/07/2022 0500 by Wynelle Beckmann, RN Outcome: Progressing 02/07/2022 0458 by Wynelle Beckmann, RN Outcome: Progressing Goal: Respiratory complications will improve 02/07/2022 0500 by Wynelle Beckmann, RN Outcome: Progressing 02/07/2022 0458 by Wynelle Beckmann, RN Outcome: Progressing Goal: Cardiovascular complication will be avoided 02/07/2022 0500 by Wynelle Beckmann, RN Outcome: Progressing 02/07/2022 0458 by Wynelle Beckmann, RN Outcome: Progressing   Problem: Activity: Goal: Risk for activity intolerance will decrease 02/07/2022 0500 by Wynelle Beckmann, RN Outcome: Progressing 02/07/2022 0458 by Wynelle Beckmann, RN Outcome: Progressing   Problem: Nutrition: Goal: Adequate nutrition will be maintained 02/07/2022 0500 by Wynelle Beckmann, RN Outcome: Progressing 02/07/2022 0458 by Wynelle Beckmann, RN Outcome: Progressing   Problem: Coping: Goal: Level of anxiety will decrease 02/07/2022  0500 by Wynelle Beckmann, RN Outcome: Progressing 02/07/2022 0458 by Wynelle Beckmann, RN Outcome: Progressing   Problem: Elimination: Goal: Will not experience complications related to bowel motility 02/07/2022 0500 by Wynelle Beckmann, RN Outcome: Progressing 02/07/2022 0458 by Wynelle Beckmann, RN Outcome: Progressing Goal: Will not experience complications related to urinary retention 02/07/2022 0500 by Wynelle Beckmann, RN Outcome: Progressing 02/07/2022 0458 by Wynelle Beckmann, RN Outcome: Progressing   Problem: Pain Managment: Goal: General experience of comfort will improve 02/07/2022 0500 by Wynelle Beckmann, RN Outcome: Progressing 02/07/2022 0458 by Wynelle Beckmann, RN Outcome: Progressing   Problem: Safety: Goal: Ability to remain free from injury will improve 02/07/2022 0500 by Wynelle Beckmann, RN Outcome: Progressing 02/07/2022 0458 by Wynelle Beckmann, RN Outcome: Progressing   Problem: Skin Integrity: Goal: Risk for impaired skin integrity will decrease 02/07/2022 0500 by Wynelle Beckmann, RN Outcome: Progressing 02/07/2022 0458 by Wynelle Beckmann, RN Outcome: Progressing   Problem: Education: Goal: Understanding of CV disease, CV risk reduction, and recovery process will improve 02/07/2022 0500 by Wynelle Beckmann, RN Outcome: Progressing 02/07/2022 0458 by Wynelle Beckmann, RN Outcome: Progressing Goal: Individualized Educational Video(s) 02/07/2022 0500 by Wynelle Beckmann, RN Outcome: Progressing 02/07/2022 0458 by Wynelle Beckmann, RN Outcome: Progressing   Problem: Activity: Goal: Ability to return to baseline activity level will improve 02/07/2022 0500 by Wynelle Beckmann, RN Outcome: Progressing 02/07/2022 0458 by Wynelle Beckmann, RN Outcome: Progressing   Problem: Cardiovascular: Goal: Ability to achieve and maintain adequate cardiovascular perfusion will improve 02/07/2022 0500 by Wynelle Beckmann, RN Outcome:  Progressing 02/07/2022 0458 by Wynelle Beckmann, RN Outcome: Progressing Goal: Vascular access site(s) Level 0-1 will be maintained 02/07/2022 0500 by Wynelle Beckmann, RN Outcome: Progressing 02/07/2022 0458 by Wynelle Beckmann, RN Outcome: Progressing   Problem: Health Behavior/Discharge Planning: Goal: Ability to safely manage health-related needs after  discharge will improve 02/07/2022 0500 by Wynelle Beckmann, RN Outcome: Progressing 02/07/2022 0458 by Wynelle Beckmann, RN Outcome: Progressing   Problem: Education: Goal: Ability to describe self-care measures that may prevent or decrease complications (Diabetes Survival Skills Education) will improve 02/07/2022 0500 by Wynelle Beckmann, RN Outcome: Progressing 02/07/2022 0458 by Wynelle Beckmann, RN Outcome: Progressing Goal: Individualized Educational Video(s) 02/07/2022 0500 by Wynelle Beckmann, RN Outcome: Progressing 02/07/2022 0458 by Wynelle Beckmann, RN Outcome: Progressing   Problem: Coping: Goal: Ability to adjust to condition or change in health will improve 02/07/2022 0500 by Wynelle Beckmann, RN Outcome: Progressing 02/07/2022 0458 by Wynelle Beckmann, RN Outcome: Progressing   Problem: Fluid Volume: Goal: Ability to maintain a balanced intake and output will improve 02/07/2022 0500 by Wynelle Beckmann, RN Outcome: Progressing 02/07/2022 0458 by Wynelle Beckmann, RN Outcome: Progressing   Problem: Health Behavior/Discharge Planning: Goal: Ability to identify and utilize available resources and services will improve 02/07/2022 0500 by Wynelle Beckmann, RN Outcome: Progressing 02/07/2022 0458 by Wynelle Beckmann, RN Outcome: Progressing Goal: Ability to manage health-related needs will improve 02/07/2022 0500 by Wynelle Beckmann, RN Outcome: Progressing 02/07/2022 0458 by Wynelle Beckmann, RN Outcome: Progressing   Problem: Metabolic: Goal: Ability to maintain appropriate glucose levels will  improve 02/07/2022 0500 by Wynelle Beckmann, RN Outcome: Progressing 02/07/2022 0458 by Wynelle Beckmann, RN Outcome: Progressing   Problem: Nutritional: Goal: Maintenance of adequate nutrition will improve 02/07/2022 0500 by Wynelle Beckmann, RN Outcome: Progressing 02/07/2022 0458 by Wynelle Beckmann, RN Outcome: Progressing Goal: Progress toward achieving an optimal weight will improve 02/07/2022 0500 by Wynelle Beckmann, RN Outcome: Progressing 02/07/2022 0458 by Wynelle Beckmann, RN Outcome: Progressing   Problem: Skin Integrity: Goal: Risk for impaired skin integrity will decrease 02/07/2022 0500 by Wynelle Beckmann, RN Outcome: Progressing 02/07/2022 0458 by Wynelle Beckmann, RN Outcome: Progressing   Problem: Tissue Perfusion: Goal: Adequacy of tissue perfusion will improve 02/07/2022 0500 by Wynelle Beckmann, RN Outcome: Progressing 02/07/2022 0458 by Wynelle Beckmann, RN Outcome: Progressing   Problem: Health Behavior/Discharge Planning: Goal: Ability to manage health-related needs will improve 02/07/2022 0500 by Wynelle Beckmann, RN Outcome: Progressing 02/07/2022 0458 by Wynelle Beckmann, RN Outcome: Progressing   Problem: Clinical Measurements: Goal: Ability to maintain clinical measurements within normal limits will improve 02/07/2022 0500 by Wynelle Beckmann, RN Outcome: Progressing 02/07/2022 0458 by Wynelle Beckmann, RN Outcome: Progressing   Problem: Clinical Measurements: Goal: Will remain free from infection 02/07/2022 0500 by Wynelle Beckmann, RN Outcome: Progressing 02/07/2022 0458 by Wynelle Beckmann, RN Outcome: Progressing   Problem: Clinical Measurements: Goal: Diagnostic test results will improve 02/07/2022 0500 by Wynelle Beckmann, RN Outcome: Progressing 02/07/2022 0458 by Wynelle Beckmann, RN Outcome: Progressing   Problem: Activity: Goal: Risk for activity intolerance will decrease 02/07/2022 0500 by Wynelle Beckmann,  RN Outcome: Progressing 02/07/2022 0458 by Wynelle Beckmann, RN Outcome: Progressing   Problem: Nutrition: Goal: Adequate nutrition will be maintained 02/07/2022 0500 by Wynelle Beckmann, RN Outcome: Progressing 02/07/2022 0458 by Wynelle Beckmann, RN Outcome: Progressing   Problem: Coping: Goal: Level of anxiety will decrease 02/07/2022 0500 by Wynelle Beckmann, RN Outcome: Progressing 02/07/2022 0458 by Wynelle Beckmann, RN Outcome: Progressing

## 2022-02-07 NOTE — Plan of Care (Signed)
  Problem: Education: Goal: Knowledge of General Education information will improve Description: Including pain rating scale, medication(s)/side effects and non-pharmacologic comfort measures Outcome: Progressing   Problem: Health Behavior/Discharge Planning: Goal: Ability to manage health-related needs will improve Outcome: Progressing   Problem: Clinical Measurements: Goal: Ability to maintain clinical measurements within normal limits will improve Outcome: Progressing Goal: Will remain free from infection Outcome: Progressing Goal: Diagnostic test results will improve Outcome: Progressing Goal: Respiratory complications will improve Outcome: Progressing Goal: Cardiovascular complication will be avoided Outcome: Progressing   Problem: Activity: Goal: Risk for activity intolerance will decrease Outcome: Progressing   Problem: Nutrition: Goal: Adequate nutrition will be maintained Outcome: Progressing   Problem: Coping: Goal: Level of anxiety will decrease Outcome: Progressing   Problem: Elimination: Goal: Will not experience complications related to bowel motility Outcome: Progressing Goal: Will not experience complications related to urinary retention Outcome: Progressing   Problem: Pain Managment: Goal: General experience of comfort will improve Outcome: Progressing   Problem: Safety: Goal: Ability to remain free from injury will improve Outcome: Progressing   Problem: Skin Integrity: Goal: Risk for impaired skin integrity will decrease Outcome: Progressing   Problem: Education: Goal: Understanding of CV disease, CV risk reduction, and recovery process will improve Outcome: Progressing Goal: Individualized Educational Video(s) Outcome: Progressing   Problem: Activity: Goal: Ability to return to baseline activity level will improve Outcome: Progressing   Problem: Cardiovascular: Goal: Ability to achieve and maintain adequate cardiovascular perfusion  will improve Outcome: Progressing Goal: Vascular access site(s) Level 0-1 will be maintained Outcome: Progressing   Problem: Health Behavior/Discharge Planning: Goal: Ability to safely manage health-related needs after discharge will improve Outcome: Progressing   Problem: Education: Goal: Ability to describe self-care measures that may prevent or decrease complications (Diabetes Survival Skills Education) will improve Outcome: Progressing Goal: Individualized Educational Video(s) Outcome: Progressing   Problem: Coping: Goal: Ability to adjust to condition or change in health will improve Outcome: Progressing   Problem: Fluid Volume: Goal: Ability to maintain a balanced intake and output will improve Outcome: Progressing   Problem: Health Behavior/Discharge Planning: Goal: Ability to identify and utilize available resources and services will improve Outcome: Progressing Goal: Ability to manage health-related needs will improve Outcome: Progressing   Problem: Metabolic: Goal: Ability to maintain appropriate glucose levels will improve Outcome: Progressing   Problem: Nutritional: Goal: Maintenance of adequate nutrition will improve Outcome: Progressing Goal: Progress toward achieving an optimal weight will improve Outcome: Progressing   Problem: Skin Integrity: Goal: Risk for impaired skin integrity will decrease Outcome: Progressing   Problem: Tissue Perfusion: Goal: Adequacy of tissue perfusion will improve Outcome: Progressing   Problem: Education: Goal: Knowledge of General Education information will improve Description: Including pain rating scale, medication(s)/side effects and non-pharmacologic comfort measures Outcome: Progressing   Problem: Health Behavior/Discharge Planning: Goal: Ability to manage health-related needs will improve Outcome: Progressing   Problem: Clinical Measurements: Goal: Ability to maintain clinical measurements within normal  limits will improve Outcome: Progressing   Problem: Clinical Measurements: Goal: Will remain free from infection Outcome: Progressing   Problem: Clinical Measurements: Goal: Diagnostic test results will improve Outcome: Progressing   Problem: Clinical Measurements: Goal: Respiratory complications will improve Outcome: Progressing   Problem: Clinical Measurements: Goal: Cardiovascular complication will be avoided Outcome: Progressing

## 2022-02-08 LAB — GLUCOSE, CAPILLARY
Glucose-Capillary: 101 mg/dL — ABNORMAL HIGH (ref 70–99)
Glucose-Capillary: 106 mg/dL — ABNORMAL HIGH (ref 70–99)
Glucose-Capillary: 85 mg/dL (ref 70–99)
Glucose-Capillary: 90 mg/dL (ref 70–99)
Glucose-Capillary: 94 mg/dL (ref 70–99)
Glucose-Capillary: 99 mg/dL (ref 70–99)

## 2022-02-08 MED ORDER — JEVITY 1.5 CAL/FIBER PO LIQD
1000.0000 mL | ORAL | Status: DC
  Administered 2022-02-08 – 2022-02-12 (×5): 1000 mL
  Filled 2022-02-08 (×8): qty 1000

## 2022-02-08 NOTE — Progress Notes (Signed)
Nutrition Brief Note:   MD consult to adjust TF to hopefully increase appetite for PO intakes.   Please see note from RD on 12/5 for full assessment. Per record, and previous RD note, the pt has not been meeting his nutritional needs. Pt is not meeting 50% of his needs.   RD will plan to decrease time frame to 12 hrs. This rate will meet 94% of kcal and 100% of protein needs. However this is a decrease from what he was previously receiving.   Intervention:  - Start Cyclic TF at 100 mL/hr x12 hrs at 8 pm - 8 am  - Continue TF 20 Prosource.  - Continue FWF of 165 mL Q4H.  - This will provide 1880 kcals, 96 gm protein, 1902 mL free water.   RD will continue to monitor tolerance. If PO intakes improve will continue to modify TF regimen.   Bethann Humble, RD, LDN, CNSC.
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
## 2022-02-08 NOTE — Plan of Care (Signed)

## 2022-02-09 LAB — GLUCOSE, CAPILLARY
Glucose-Capillary: 100 mg/dL — ABNORMAL HIGH (ref 70–99)
Glucose-Capillary: 109 mg/dL — ABNORMAL HIGH (ref 70–99)
Glucose-Capillary: 135 mg/dL — ABNORMAL HIGH (ref 70–99)
Glucose-Capillary: 75 mg/dL (ref 70–99)
Glucose-Capillary: 91 mg/dL (ref 70–99)
Glucose-Capillary: 94 mg/dL (ref 70–99)
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
## 2022-02-09 NOTE — TOC Progression Note (Signed)
Transition of Care (TOC) - Progression Note    Patient Details  Name: Umer Harig. MRN: [id number] Date of Birth: 05/25/1960  Transition of Care [facility]) CM/SW Contact  Glennon Mac, RN Phone Number: 02/09/2022, 5:15 PM  Clinical Narrative:    Medicaid still pending; still no bed offers for LOG SNF.   Patient still requiring soft restraints. Will continue to follow for disposition.    Expected Discharge Plan: Skilled Nursing Facility Barriers to Discharge: Continued Medical Work up  Expected Discharge Plan and Services Expected Discharge Plan: Skilled Nursing Facility   Discharge Planning Services: CM Consult Post Acute Care Choice: IP Rehab Living arrangements for the past 2 months: Apartment                                       Social Determinants of Health (SDOH) Interventions    Readmission Risk Interventions    01/11/2020   10:04 AM  Readmission Risk Prevention Plan  Post Dischage Appt Complete  Medication Screening Complete  Transportation Screening Complete   Quintella Baton, RN, BSN  Trauma/Neuro ICU Case Manager [phone]
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
## 2022-02-10 LAB — GLUCOSE, CAPILLARY
Glucose-Capillary: 101 mg/dL — ABNORMAL HIGH (ref 70–99)
Glucose-Capillary: 107 mg/dL — ABNORMAL HIGH (ref 70–99)
Glucose-Capillary: 109 mg/dL — ABNORMAL HIGH (ref 70–99)
Glucose-Capillary: 122 mg/dL — ABNORMAL HIGH (ref 70–99)
Glucose-Capillary: 73 mg/dL (ref 70–99)
Glucose-Capillary: 80 mg/dL (ref 70–99)
Glucose-Capillary: 94 mg/dL (ref 70–99)

## 2022-02-10 LAB — VALPROIC ACID LEVEL: Valproic Acid Lvl: 73 ug/mL (ref 50.0–100.0)

## 2022-02-10 NOTE — Progress Notes (Signed)
Patients soft restraints order expired, restraints removed; sitter in room with patient. Patient is tolerating well without restraints.
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
## 2022-02-11 LAB — GLUCOSE, CAPILLARY
Glucose-Capillary: 142 mg/dL — ABNORMAL HIGH (ref 70–99)
Glucose-Capillary: 99 mg/dL (ref 70–99)
Glucose-Capillary: 132 mg/dL — ABNORMAL HIGH (ref 70–99)
Glucose-Capillary: 110 mg/dL — ABNORMAL HIGH (ref 70–99)
Glucose-Capillary: 72 mg/dL (ref 70–99)
Glucose-Capillary: 88 mg/dL (ref 70–99)

## 2022-02-11 MED ORDER — CLONAZEPAM 0.5 MG PO TABS
2.0000 mg | ORAL_TABLET | Freq: Three times a day (TID) | ORAL | Status: DC
  Administered 2022-02-11 – 2022-02-28 (×50): 2 mg
  Filled 2022-02-11 (×50): qty 4
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
## 2022-02-12 LAB — GLUCOSE, CAPILLARY
Glucose-Capillary: 105 mg/dL — ABNORMAL HIGH (ref 70–99)
Glucose-Capillary: 102 mg/dL — ABNORMAL HIGH (ref 70–99)
Glucose-Capillary: 103 mg/dL — ABNORMAL HIGH (ref 70–99)
Glucose-Capillary: 104 mg/dL — ABNORMAL HIGH (ref 70–99)
Glucose-Capillary: 76 mg/dL (ref 70–99)

## 2022-02-12 MED ORDER — METOPROLOL TARTRATE 25 MG/10 ML ORAL SUSPENSION
12.5000 mg | Freq: Two times a day (BID) | ORAL | Status: DC
  Administered 2022-02-12 – 2022-03-12 (×48): 12.5 mg
  Filled 2022-02-12 (×58): qty 5
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
## 2022-02-13 LAB — GLUCOSE, CAPILLARY
Glucose-Capillary: 112 mg/dL — ABNORMAL HIGH (ref 70–99)
Glucose-Capillary: 121 mg/dL — ABNORMAL HIGH (ref 70–99)
Glucose-Capillary: 82 mg/dL (ref 70–99)
Glucose-Capillary: 92 mg/dL (ref 70–99)
Glucose-Capillary: 95 mg/dL (ref 70–99)
Glucose-Capillary: 98 mg/dL (ref 70–99)

## 2022-02-13 LAB — COMPREHENSIVE METABOLIC PANEL
ALT: 22 U/L (ref 0–44)
AST: 17 U/L (ref 15–41)
Albumin: 3.2 g/dL — ABNORMAL LOW (ref 3.5–5.0)
Alkaline Phosphatase: 108 U/L (ref 38–126)
Anion gap: 9 (ref 5–15)
BUN: 26 mg/dL — ABNORMAL HIGH (ref 8–23)
CO2: 27 mmol/L (ref 22–32)
Calcium: 9.5 mg/dL (ref 8.9–10.3)
Chloride: 99 mmol/L (ref 98–111)
Creatinine, Ser: 0.9 mg/dL (ref 0.61–1.24)
GFR, Estimated: >60 mL/min (ref >60)
Glucose, Bld: 140 mg/dL — ABNORMAL HIGH (ref 70–99)
Potassium: 4.1 mmol/L (ref 3.5–5.1)
Sodium: 135 mmol/L (ref 135–145)
Total Bilirubin: 0.3 mg/dL (ref 0.3–1.2)
Total Protein: 7.8 g/dL (ref 6.5–8.1)

## 2022-02-13 MED ORDER — FREE WATER
135.0000 mL | Status: DC
  Administered 2022-02-13 – 2022-03-08 (×132): 135 mL

## 2022-02-13 MED ORDER — JEVITY 1.5 CAL/FIBER PO LIQD
474.0000 mL | Freq: Three times a day (TID) | ORAL | Status: DC
  Administered 2022-02-13: 237 mL
  Administered 2022-02-13 – 2022-02-19 (×17): 474 mL
  Filled 2022-02-13 (×21): qty 474

## 2022-02-13 MED ORDER — FREE WATER
60.0000 mL | Freq: Three times a day (TID) | Status: DC
  Administered 2022-02-13 – 2022-03-09 (×70): 60 mL
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
## 2022-02-13 NOTE — TOC Progression Note (Signed)
Transition of Care (TOC) - Progression Note    Patient Details  Name: Bill Brooks. MRN: [id number] Date of Birth: 04-01-1960  Transition of Care [facility]) CM/SW Contact  Glennon Mac, RN Phone Number: 02/13/2022, 3:07 PM  Clinical Narrative:    No current bed offers; patient remains in soft restraints, and these must be discontinued prior to SNF placement. Will continue to follow.    Expected Discharge Plan: Skilled Nursing Facility Barriers to Discharge: Continued Medical Work up  Expected Discharge Plan and Services Expected Discharge Plan: Skilled Nursing Facility   Discharge Planning Services: CM Consult Post Acute Care Choice: IP Rehab Living arrangements for the past 2 months: Apartment                                       Social Determinants of Health (SDOH) Interventions    Readmission Risk Interventions    01/11/2020   10:04 AM  Readmission Risk Prevention Plan  Post Dischage Appt Complete  Medication Screening Complete  Transportation Screening Complete  Quintella Baton, RN, BSN  Trauma/Neuro ICU Case Manager [phone]

## 2022-02-13 NOTE — Progress Notes (Signed)
OT Cancellation Note  Patient Details Name: Bill Brooks. MRN: [id number] DOB: [birth year]   Cancelled Treatment:    Reason Eval/Treat Not Completed: Fatigue/lethargy limiting ability to participate. Pt asleep upon OT entry. Attempted to awaken pt who kept eyes closed and would only repeat "no" and "sleep". RN to room and agreed that when pt is lethargic like this, he cannot be convinced to wake up.    Theodoro Clock 02/13/2022, 10:17 AM

## 2022-02-14 LAB — GLUCOSE, CAPILLARY
Glucose-Capillary: 100 mg/dL — ABNORMAL HIGH (ref 70–99)
Glucose-Capillary: 103 mg/dL — ABNORMAL HIGH (ref 70–99)
Glucose-Capillary: 105 mg/dL — ABNORMAL HIGH (ref 70–99)
Glucose-Capillary: 70 mg/dL (ref 70–99)
Glucose-Capillary: 89 mg/dL (ref 70–99)
Glucose-Capillary: 99 mg/dL (ref 70–99)
Glucose-Capillary: 99 mg/dL (ref 70–99)
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
## 2022-02-15 LAB — GLUCOSE, CAPILLARY
Glucose-Capillary: 132 mg/dL — ABNORMAL HIGH (ref 70–99)
Glucose-Capillary: 162 mg/dL — ABNORMAL HIGH (ref 70–99)
Glucose-Capillary: 64 mg/dL — ABNORMAL LOW (ref 70–99)
Glucose-Capillary: 72 mg/dL (ref 70–99)
Glucose-Capillary: 84 mg/dL (ref 70–99)
Glucose-Capillary: 86 mg/dL (ref 70–99)

## 2022-02-15 LAB — VALPROIC ACID LEVEL: Valproic Acid Lvl: 72 ug/mL (ref 50.0–100.0)

## 2022-02-15 MED ORDER — POLYETHYLENE GLYCOL 3350 17 G PO PACK
17.0000 g | PACK | Freq: Every day | ORAL | Status: DC
  Administered 2022-02-16 – 2022-03-12 (×24): 17 g
  Filled 2022-02-15 (×22): qty 1

## 2022-02-15 NOTE — Plan of Care (Signed)
  Problem: Health Behavior/Discharge Planning: Goal: Ability to manage health-related needs will improve Outcome: Not Progressing   Problem: Clinical Measurements: Goal: Ability to maintain clinical measurements within normal limits will improve Outcome: Not Progressing Goal: Will remain free from infection Outcome: Progressing   Problem: Coping: Goal: Level of anxiety will decrease Outcome: Not Progressing   Problem: Elimination: Goal: Will not experience complications related to bowel motility Outcome: Progressing
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
## 2022-02-16 LAB — GLUCOSE, CAPILLARY
Glucose-Capillary: 103 mg/dL — ABNORMAL HIGH (ref 70–99)
Glucose-Capillary: 104 mg/dL — ABNORMAL HIGH (ref 70–99)
Glucose-Capillary: 107 mg/dL — ABNORMAL HIGH (ref 70–99)
Glucose-Capillary: 118 mg/dL — ABNORMAL HIGH (ref 70–99)
Glucose-Capillary: 74 mg/dL (ref 70–99)
Glucose-Capillary: 94 mg/dL (ref 70–99)

## 2022-02-16 NOTE — Progress Notes (Signed)
OT Cancellation Note  Patient Details Name: Bill Brooks. MRN: [id number] DOB: [birth year]   Cancelled Treatment:      Pt not seen for tx this afternoon d/t nursing care taking place and pt agitation. Will follow up per POC to progress towards acute OT goals.  Elenore Paddy Devron Cohick 02/16/2022, 3:26 PM

## 2022-02-16 NOTE — Progress Notes (Signed)
PT Cancellation Note  Patient Details Name: Bill Brooks. MRN: [id number] DOB: [birth year]   Cancelled Treatment:    Reason Eval/Treat Not Completed: Fatigue/lethargy limiting ability to participate.  Pt was asleep and could not awaken, sitter attempted to help.  Follow up at another time as pt can tolerate.   [name] 02/16/2022, 3:36 PM  [name], PT PhD Acute Rehab Dept. Number: [facility] [id number] and [facility] [phone]
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
## 2022-02-17 LAB — GLUCOSE, CAPILLARY
Glucose-Capillary: 109 mg/dL — ABNORMAL HIGH (ref 70–99)
Glucose-Capillary: 120 mg/dL — ABNORMAL HIGH (ref 70–99)
Glucose-Capillary: 129 mg/dL — ABNORMAL HIGH (ref 70–99)
Glucose-Capillary: 64 mg/dL — ABNORMAL LOW (ref 70–99)
Glucose-Capillary: 67 mg/dL — ABNORMAL LOW (ref 70–99)
Glucose-Capillary: 72 mg/dL (ref 70–99)
Glucose-Capillary: 80 mg/dL (ref 70–99)

## 2022-02-17 MED ORDER — PANCRELIPASE (LIP-PROT-AMYL) 10440-39150 UNITS PO TABS
20880.0000 [IU] | ORAL_TABLET | Freq: Once | ORAL | Status: DC
  Filled 2022-02-17: qty 2

## 2022-02-17 MED ORDER — SODIUM BICARBONATE 650 MG PO TABS: 650.0000 mg | ORAL_TABLET | Freq: Once | ORAL | Status: DC

## 2022-02-17 NOTE — Progress Notes (Signed)
Patient peg tube clogged ,on call informed,will continue to monitor.
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
## 2022-02-18 LAB — GLUCOSE, CAPILLARY
Glucose-Capillary: 59 mg/dL — ABNORMAL LOW (ref 70–99)
Glucose-Capillary: 61 mg/dL — ABNORMAL LOW (ref 70–99)
Glucose-Capillary: 74 mg/dL (ref 70–99)
Glucose-Capillary: 100 mg/dL — ABNORMAL HIGH (ref 70–99)
Glucose-Capillary: 97 mg/dL (ref 70–99)
Glucose-Capillary: 95 mg/dL (ref 70–99)
Glucose-Capillary: 101 mg/dL — ABNORMAL HIGH (ref 70–99)

## 2022-02-18 NOTE — Progress Notes (Signed)
Patient peg tube was patent,lukewarm water used per orders,did not have to use lipase/protease/amylase,will continue to monitor.
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
## 2022-02-19 LAB — GLUCOSE, CAPILLARY
Glucose-Capillary: 103 mg/dL — ABNORMAL HIGH (ref 70–99)
Glucose-Capillary: 112 mg/dL — ABNORMAL HIGH (ref 70–99)
Glucose-Capillary: 117 mg/dL — ABNORMAL HIGH (ref 70–99)
Glucose-Capillary: 149 mg/dL — ABNORMAL HIGH (ref 70–99)
Glucose-Capillary: 96 mg/dL (ref 70–99)
Glucose-Capillary: 97 mg/dL (ref 70–99)

## 2022-02-19 MED ORDER — OSMOLITE 1.5 CAL PO LIQD
474.0000 mL | Freq: Three times a day (TID) | ORAL | Status: DC
  Administered 2022-02-19 – 2022-03-02 (×34): 474 mL
  Administered 2022-03-03: 232 mL
  Administered 2022-03-03 – 2022-03-08 (×11): 474 mL
  Filled 2022-02-19 (×17): qty 474
  Filled 2022-02-19: qty 948
  Filled 2022-02-19: qty 474
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
## 2022-02-19 NOTE — Progress Notes (Incomplete)
Pt has 1:1 sitter. He sleeping at this time. Pt receive schedule medication
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
## 2022-02-19 NOTE — Progress Notes (Signed)
Patient has his restraint expired,MD notified,will continue to monitor.

## 2022-02-20 LAB — GLUCOSE, CAPILLARY
Glucose-Capillary: 120 mg/dL — ABNORMAL HIGH (ref 70–99)
Glucose-Capillary: 145 mg/dL — ABNORMAL HIGH (ref 70–99)
Glucose-Capillary: 152 mg/dL — ABNORMAL HIGH (ref 70–99)
Glucose-Capillary: 153 mg/dL — ABNORMAL HIGH (ref 70–99)
Glucose-Capillary: 59 mg/dL — ABNORMAL LOW (ref 70–99)
Glucose-Capillary: 67 mg/dL — ABNORMAL LOW (ref 70–99)
Glucose-Capillary: 77 mg/dL (ref 70–99)
Glucose-Capillary: 78 mg/dL (ref 70–99)
Glucose-Capillary: 83 mg/dL (ref 70–99)
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
## 2022-02-20 NOTE — Progress Notes (Signed)
Hypoglycemic Event  CBG: 59  Treatment: 4 oz juice/soda  Symptoms: Sweaty  Follow-up CBG: [id number] CBG Result:78  Possible Reasons for Event: Unknown  Comments/MD notified:    Fionnuala Hemmerich A Imraan Wendell

## 2022-02-21 LAB — GLUCOSE, CAPILLARY
Glucose-Capillary: 103 mg/dL — ABNORMAL HIGH (ref 70–99)
Glucose-Capillary: 147 mg/dL — ABNORMAL HIGH (ref 70–99)
Glucose-Capillary: 70 mg/dL (ref 70–99)
Glucose-Capillary: 73 mg/dL (ref 70–99)
Glucose-Capillary: 83 mg/dL (ref 70–99)
Glucose-Capillary: 86 mg/dL (ref 70–99)

## 2022-02-21 NOTE — Progress Notes (Signed)
Occupational Therapy Discharge Patient Details Name: Bill Brooks. MRN: [id number] DOB: 10-12-1960 Today's Date: 02/21/2022 Time: 7341-9379 OT Time Calculation (min): 9 min  Attempted to see patient for skilled OT treatment and to assess progress with therapy goals. Pt in bed upon therapy arrival sleeping. Unable to wake patient up in order to participate in therapy session. Verbal and tactile stimuli used. Pt seemed to wake up slightly although did not open eyes. Continued to sleep.   At this time, patient will be discharged from OT services secondary to patient has made no progress toward goals in a reasonable time frame. During acute care stay, pt's ability to participate has been limited due to lethargy, cognitive deficits, and resistance to participate overall. Since 12/09/21, pt has required 2 person assist for functional transfers. He is guarded and resistive when any ROM exercises are attempted with BUEs. Prior to 01/09/22, pt required Max A for bed mobility; now patient requires Mod-Max A x2. Since initial evaluation, pt has required max-total assist to complete BADL tasks such as self-feeding, bathing, dressing, grooming, and toileting. Recommend nursing staff continue to provide assistance with BADL tasks and functional transfers while encouraging pt to participate in care. If pt has a change in status and is able to participate in skilled OT services please send new referral and he will be re-evaluated. Acute OT signing off.   Progress and discharge plan discussed with patient and/or caregiver: Patient unable to participate in discharge planning and no caregivers available    Limmie Patricia, OTR/L,CBIS  Supplemental OT - [facility]  02/21/2022, 2:33 PM
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
## 2022-02-22 LAB — GLUCOSE, CAPILLARY
Glucose-Capillary: 100 mg/dL — ABNORMAL HIGH (ref 70–99)
Glucose-Capillary: 88 mg/dL (ref 70–99)
Glucose-Capillary: 92 mg/dL (ref 70–99)
Glucose-Capillary: 131 mg/dL — ABNORMAL HIGH (ref 70–99)
Glucose-Capillary: 98 mg/dL (ref 70–99)
Glucose-Capillary: 68 mg/dL — ABNORMAL LOW (ref 70–99)
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
## 2022-02-22 NOTE — TOC Progression Note (Signed)
Transition of Care (TOC) - Progression Note    Patient Details  Name: Bill Brooks. MRN: [id number] Date of Birth: Jul 24, 1960  Transition of Care [facility]) CM/SW Contact  Astrid Drafts Berna Spare, RN Phone Number: 02/22/2022, 4:30 PM  Clinical Narrative:    Left message for Cala Bradford at [facility] in [place], requesting update on referral previously sent.      Barriers to Discharge: Continued Medical Work up  Expected Discharge Plan and Services   Discharge Planning Services: CM Consult Post Acute Care Choice: IP Rehab Living arrangements for the past 2 months: Apartment                                       Social Determinants of Health (SDOH) Interventions SDOH Screenings   Depression (PHQ2-9): Low Risk  (07/23/2018)  Tobacco Use: High Risk (01/12/2022)    Readmission Risk Interventions    01/11/2020   10:04 AM  Readmission Risk Prevention Plan  Post Dischage Appt Complete  Medication Screening Complete  Transportation Screening Complete   Quintella Baton, RN, BSN  Trauma/Neuro ICU Case Manager [phone]

## 2022-02-23 LAB — GLUCOSE, CAPILLARY
Glucose-Capillary: 110 mg/dL — ABNORMAL HIGH (ref 70–99)
Glucose-Capillary: 119 mg/dL — ABNORMAL HIGH (ref 70–99)
Glucose-Capillary: 162 mg/dL — ABNORMAL HIGH (ref 70–99)
Glucose-Capillary: 178 mg/dL — ABNORMAL HIGH (ref 70–99)
Glucose-Capillary: 78 mg/dL (ref 70–99)
Glucose-Capillary: 90 mg/dL (ref 70–99)
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
## 2022-02-24 LAB — GLUCOSE, CAPILLARY
Glucose-Capillary: 108 mg/dL — ABNORMAL HIGH (ref 70–99)
Glucose-Capillary: 110 mg/dL — ABNORMAL HIGH (ref 70–99)
Glucose-Capillary: 113 mg/dL — ABNORMAL HIGH (ref 70–99)
Glucose-Capillary: 131 mg/dL — ABNORMAL HIGH (ref 70–99)
Glucose-Capillary: 141 mg/dL — ABNORMAL HIGH (ref 70–99)
Glucose-Capillary: 143 mg/dL — ABNORMAL HIGH (ref 70–99)
Glucose-Capillary: 167 mg/dL — ABNORMAL HIGH (ref 70–99)
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
## 2022-02-25 LAB — GLUCOSE, CAPILLARY
Glucose-Capillary: 101 mg/dL — ABNORMAL HIGH (ref 70–99)
Glucose-Capillary: 142 mg/dL — ABNORMAL HIGH (ref 70–99)
Glucose-Capillary: 165 mg/dL — ABNORMAL HIGH (ref 70–99)
Glucose-Capillary: 165 mg/dL — ABNORMAL HIGH (ref 70–99)
Glucose-Capillary: 84 mg/dL (ref 70–99)
Glucose-Capillary: 95 mg/dL (ref 70–99)
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
## 2022-02-25 NOTE — Progress Notes (Signed)
   02/25/22 0834  Assess: MEWS Score  Temp 98.2 F (36.8 C)  BP 97/78  MAP (mmHg) 85  Pulse Rate (!) 104  Resp 16  SpO2 96 %  O2 Device Room Air  Assess: MEWS Score  MEWS Temp 0  MEWS Systolic 1  MEWS Pulse 1  MEWS RR 0  MEWS LOC 0  MEWS Score 2  MEWS Score Color Yellow  Treat  MEWS Interventions Escalated (See documentation below)  Pain Scale 0-10  Pain Score 0  Faces Pain Scale 0  Take Vital Signs  Increase Vital Sign Frequency  Yellow: Q 2hr X 2 then Q 4hr X 2, if remains yellow, continue Q 4hrs  Escalate  MEWS: Escalate Yellow: discuss with charge nurse/RN and consider discussing with provider and RRT  Notify: Charge Nurse/RN  Name of Charge Nurse/RN Notified [place], RN  Date Charge Nurse/RN Notified 02/25/22  Provider Notification  Provider Name/Title Barnetta Chapel, PA  Date Provider Notified 02/25/22  Time Provider Notified 0840  Method of Notification Page  Notification Reason Other (Comment) (Increased Heart rate -Yellow Mews)  Provider response No new orders  Date of Provider Response 02/25/22  Time of Provider Response 0845  Assess: SIRS CRITERIA  SIRS Temperature  0  SIRS Pulse 1  SIRS Respirations  0  SIRS WBC 0  SIRS Score Sum  1

## 2022-02-26 LAB — GLUCOSE, CAPILLARY
Glucose-Capillary: 99 mg/dL (ref 70–99)
Glucose-Capillary: 89 mg/dL (ref 70–99)
Glucose-Capillary: 97 mg/dL (ref 70–99)
Glucose-Capillary: 98 mg/dL (ref 70–99)
Glucose-Capillary: 144 mg/dL — ABNORMAL HIGH (ref 70–99)
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
## 2022-02-26 NOTE — Plan of Care (Signed)

## 2022-02-27 LAB — GLUCOSE, CAPILLARY
Glucose-Capillary: 116 mg/dL — ABNORMAL HIGH (ref 70–99)
Glucose-Capillary: 128 mg/dL — ABNORMAL HIGH (ref 70–99)
Glucose-Capillary: 146 mg/dL — ABNORMAL HIGH (ref 70–99)
Glucose-Capillary: 165 mg/dL — ABNORMAL HIGH (ref 70–99)
Glucose-Capillary: 55 mg/dL — ABNORMAL LOW (ref 70–99)
Glucose-Capillary: 63 mg/dL — ABNORMAL LOW (ref 70–99)
Glucose-Capillary: 75 mg/dL (ref 70–99)
Glucose-Capillary: 76 mg/dL (ref 70–99)

## 2022-02-27 LAB — VALPROIC ACID LEVEL: Valproic Acid Lvl: 56 ug/mL (ref 50.0–100.0)

## 2022-02-27 NOTE — Inpatient Diabetes Management (Signed)
Inpatient Diabetes Program Recommendations  AACE/ADA: New Consensus Statement on Inpatient Glycemic Control (2015)  Target Ranges:  Prepandial:   less than 140 mg/dL      Peak postprandial:   less than 180 mg/dL (1-2 hours)      Critically ill patients:  140 - 180 mg/dL   Lab Results  Component Value Date   GLUCAP 63 (L) 02/27/2022   HGBA1C 5.6 11/18/2021    Review of Glycemic Control  Latest Reference Range & Units 02/27/22 04:31 02/27/22 08:50 02/27/22 12:32  Glucose-Capillary 70 - 99 mg/dL 76 75 63 (L)  (L): Data is abnormally low   Current orders for Inpatient glycemic control: Novolog 0-15 units Q4H  Inpatient Diabetes Program Recommendations:    Novolog 0-9 units Q4H  Will continue to follow while inpatient.  Thank you, Dulce Sellar, MSN, CDCES Diabetes Coordinator Inpatient Diabetes Program [phone] (team pager from 8a-5p)

## 2022-02-27 NOTE — Progress Notes (Addendum)
Patient slept throughout the night and ended up not needing the ordered bilateral soft wrist non-violent restraints. Day shift nurse made aware.
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
## 2022-02-27 NOTE — Progress Notes (Signed)
Pt CBG 55 at this time. PO intake of about of juice at this time. Pt remains awake and alert at baseline. In general, PO intake remains poor. Continue bolus feeds per orders. Will recheck CBG

## 2022-02-27 NOTE — Progress Notes (Signed)
CBG recheck 116

## 2022-02-27 NOTE — Progress Notes (Signed)
Pt has remained out of bilat wrist restraints throughout PM shift, per report. He is drowsy and calm upon assessment. Surgery PA at bedside for assessment.
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
## 2022-02-27 NOTE — Progress Notes (Signed)
Pt has remained out of wrist restraints throughout shift. He is drowsy but awakens to voice and remains calm while awake. He continues to refuse meals but has been compliant with care today.

## 2022-02-28 LAB — GLUCOSE, CAPILLARY
Glucose-Capillary: 102 mg/dL — ABNORMAL HIGH (ref 70–99)
Glucose-Capillary: 135 mg/dL — ABNORMAL HIGH (ref 70–99)
Glucose-Capillary: 48 mg/dL — ABNORMAL LOW (ref 70–99)
Glucose-Capillary: 70 mg/dL (ref 70–99)
Glucose-Capillary: 70 mg/dL (ref 70–99)
Glucose-Capillary: 85 mg/dL (ref 70–99)
Glucose-Capillary: 99 mg/dL (ref 70–99)

## 2022-02-28 LAB — CBC
HCT: 32.2 % — ABNORMAL LOW (ref 39.0–52.0)
Hemoglobin: 10.3 g/dL — ABNORMAL LOW (ref 13.0–17.0)
MCH: 27.2 pg (ref 26.0–34.0)
MCHC: 32 g/dL (ref 30.0–36.0)
MCV: 85.2 fL (ref 80.0–100.0)
Platelets: 225 10*3/uL (ref 150–400)
RBC: 3.78 MIL/uL — ABNORMAL LOW (ref 4.22–5.81)
RDW: 16.8 % — ABNORMAL HIGH (ref 11.5–15.5)
WBC: 7.8 10*3/uL (ref 4.0–10.5)
nRBC: 0 % (ref 0.0–0.2)

## 2022-02-28 LAB — COMPREHENSIVE METABOLIC PANEL
ALT: 22 U/L (ref 0–44)
AST: 27 U/L (ref 15–41)
Albumin: 2.8 g/dL — ABNORMAL LOW (ref 3.5–5.0)
Alkaline Phosphatase: 91 U/L (ref 38–126)
Anion gap: 7 (ref 5–15)
BUN: 28 mg/dL — ABNORMAL HIGH (ref 8–23)
CO2: 27 mmol/L (ref 22–32)
Calcium: 9.4 mg/dL (ref 8.9–10.3)
Chloride: 105 mmol/L (ref 98–111)
Creatinine, Ser: 0.88 mg/dL (ref 0.61–1.24)
GFR, Estimated: >60 mL/min (ref >60)
Glucose, Bld: 91 mg/dL (ref 70–99)
Potassium: 4.9 mmol/L (ref 3.5–5.1)
Sodium: 139 mmol/L (ref 135–145)
Total Bilirubin: 0.4 mg/dL (ref 0.3–1.2)
Total Protein: 7.2 g/dL (ref 6.5–8.1)

## 2022-02-28 LAB — VITAMIN B12: Vitamin B-12: 594 pg/mL (ref 180–914)

## 2022-02-28 LAB — TSH: TSH: 2.19 u[IU]/mL (ref 0.350–4.500)

## 2022-02-28 LAB — FOLATE: Folate: 29.2 ng/mL (ref >5.9)

## 2022-02-28 LAB — MAGNESIUM: Magnesium: 1.9 mg/dL (ref 1.7–2.4)

## 2022-02-28 LAB — AMMONIA: Ammonia: 37 umol/L — ABNORMAL HIGH (ref 9–35)

## 2022-02-28 MED ORDER — INSULIN ASPART 100 UNIT/ML IJ SOLN
0.0000 [IU] | Freq: Three times a day (TID) | INTRAMUSCULAR | Status: DC
  Administered 2022-03-08: 1 [IU] via SUBCUTANEOUS
  Administered 2022-03-10: 2 [IU] via SUBCUTANEOUS
  Administered 2022-03-13 – 2022-03-15 (×2): 1 [IU] via SUBCUTANEOUS
  Administered 2022-03-15 (×3): 2 [IU] via SUBCUTANEOUS

## 2022-02-28 MED ORDER — VALPROIC ACID 250 MG/5ML PO SOLN
500.0000 mg | Freq: Three times a day (TID) | ORAL | Status: DC
  Administered 2022-03-01 – 2022-03-12 (×35): 500 mg
  Filled 2022-02-28 (×39): qty 10

## 2022-02-28 MED ORDER — CLONAZEPAM 0.5 MG PO TABS
1.0000 mg | ORAL_TABLET | Freq: Three times a day (TID) | ORAL | Status: DC
  Administered 2022-02-28 – 2022-03-12 (×36): 1 mg
  Filled 2022-02-28 (×36): qty 2

## 2022-02-28 MED ORDER — THIAMINE HCL 100 MG/ML IJ SOLN
500.0000 mg | INTRAVENOUS | Status: DC
  Filled 2022-02-28 (×2): qty 5
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
## 2022-02-28 NOTE — Progress Notes (Signed)
Per [facility] she talked to the nurse practitioner from psych and she wants to hold patients 1400 dose of depakote. She put a hold in the order on MAR.
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
## 2022-02-28 NOTE — Progress Notes (Signed)
I spoke to Bill Brooks, patient's sister on the phone today.  I relayed my concerns regarding Bill Brooks' continue decline and FTT.  We discussed that OT has signed off and that PT has transitioned to once a week, but he continues to be most lethargic and not able/willing to participate in therapy to improve.  I also spoke to her that I have stopped one of his doses of seroquel this week in attempts to try to get him to wake up to be more participatory, but he has not had any change.  In the past when we have adjusted his medications he has become difficult to control and has required restraints so slow weaning from more medications may be feasible but little by little.  He has been out of his restraints now for 48 hours.  I discussed code status as a place to start with [facility]  I asked her to discuss this with her brother.  We discussed that he is full code right now and our recommendations would be to transition to DNR given his quality of life has declined.  I also offered her a palliative care consult to help her and her brother in discussions regarding what Bill Brooks would want moving forward and GOC given Bill Brooks is FTT and does not eat and only gets nutrition via his g-tube, not for any mechanical reason but secondary to no desire/refusal to eat.  Bill Brooks was open to this idea and ok with starting that conversation.  I told her there was no rush into all of this as we have time given there is no dispo on the horizon at this time.  She was appreciative and said she would like the palliative care consult and to speak to her brother.  She may be trying to come visit today if she is able.  Bill Brooks 9:03 AM 02/28/2022

## 2022-03-01 ENCOUNTER — Encounter (HOSPITAL_COMMUNITY): Payer: Self-pay | Admitting: General Surgery

## 2022-03-01 DIAGNOSIS — Z7189 Other specified counseling: Secondary | ICD-10-CM

## 2022-03-01 DIAGNOSIS — Z515 Encounter for palliative care: Secondary | ICD-10-CM

## 2022-03-01 DIAGNOSIS — S065XAA Traumatic subdural hemorrhage with loss of consciousness status unknown, initial encounter: Secondary | ICD-10-CM

## 2022-03-01 LAB — URINALYSIS, ROUTINE W REFLEX MICROSCOPIC
Bacteria, UA: NONE SEEN
Bilirubin Urine: NEGATIVE
Glucose, UA: NEGATIVE mg/dL
Hgb urine dipstick: NEGATIVE
Ketones, ur: NEGATIVE mg/dL
Nitrite: NEGATIVE
Protein, ur: NEGATIVE mg/dL
Specific Gravity, Urine: 1.021 (ref 1.005–1.030)
pH: 5 (ref 5.0–8.0)

## 2022-03-01 LAB — GLUCOSE, CAPILLARY
Glucose-Capillary: 118 mg/dL — ABNORMAL HIGH (ref 70–99)
Glucose-Capillary: 110 mg/dL — ABNORMAL HIGH (ref 70–99)
Glucose-Capillary: 125 mg/dL — ABNORMAL HIGH (ref 70–99)
Glucose-Capillary: 111 mg/dL — ABNORMAL HIGH (ref 70–99)
Glucose-Capillary: 89 mg/dL (ref 70–99)

## 2022-03-01 LAB — VALPROIC ACID LEVEL: Valproic Acid Lvl: 25 ug/mL — ABNORMAL LOW (ref 50.0–100.0)

## 2022-03-01 LAB — RPR: RPR Ser Ql: NONREACTIVE

## 2022-03-01 MED ORDER — THIAMINE MONONITRATE 100 MG PO TABS
100.0000 mg | ORAL_TABLET | Freq: Every day | ORAL | Status: DC
  Administered 2022-03-02 – 2022-03-12 (×11): 100 mg
  Filled 2022-03-01 (×11): qty 1

## 2022-03-01 MED ORDER — PROSOURCE TF20 ENFIT COMPATIBL EN LIQD
60.0000 mL | Freq: Two times a day (BID) | ENTERAL | Status: DC
  Administered 2022-03-01 – 2022-03-09 (×17): 60 mL
  Filled 2022-03-01 (×17): qty 60
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
## 2022-03-02 LAB — GLUCOSE, CAPILLARY
Glucose-Capillary: 113 mg/dL — ABNORMAL HIGH (ref 70–99)
Glucose-Capillary: 114 mg/dL — ABNORMAL HIGH (ref 70–99)
Glucose-Capillary: 140 mg/dL — ABNORMAL HIGH (ref 70–99)
Glucose-Capillary: 155 mg/dL — ABNORMAL HIGH (ref 70–99)
Glucose-Capillary: 69 mg/dL — ABNORMAL LOW (ref 70–99)
Glucose-Capillary: 80 mg/dL (ref 70–99)
Glucose-Capillary: 92 mg/dL (ref 70–99)
Glucose-Capillary: 96 mg/dL (ref 70–99)
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
## 2022-03-03 LAB — GLUCOSE, CAPILLARY
Glucose-Capillary: 102 mg/dL — ABNORMAL HIGH (ref 70–99)
Glucose-Capillary: 109 mg/dL — ABNORMAL HIGH (ref 70–99)
Glucose-Capillary: 118 mg/dL — ABNORMAL HIGH (ref 70–99)
Glucose-Capillary: 89 mg/dL (ref 70–99)
Glucose-Capillary: 91 mg/dL (ref 70–99)
Glucose-Capillary: 95 mg/dL (ref 70–99)
Glucose-Capillary: 95 mg/dL (ref 70–99)
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
## 2022-03-04 LAB — GLUCOSE, CAPILLARY
Glucose-Capillary: 104 mg/dL — ABNORMAL HIGH (ref 70–99)
Glucose-Capillary: 99 mg/dL (ref 70–99)
Glucose-Capillary: 109 mg/dL — ABNORMAL HIGH (ref 70–99)
Glucose-Capillary: 106 mg/dL — ABNORMAL HIGH (ref 70–99)
Glucose-Capillary: 111 mg/dL — ABNORMAL HIGH (ref 70–99)

## 2022-03-04 MED ORDER — HALOPERIDOL LACTATE 5 MG/ML IJ SOLN
2.0000 mg | Freq: Once | INTRAMUSCULAR | Status: AC
  Administered 2022-03-04: 2 mg via INTRAVENOUS
  Filled 2022-03-04: qty 1
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
## 2022-03-05 LAB — GLUCOSE, CAPILLARY
Glucose-Capillary: 111 mg/dL — ABNORMAL HIGH (ref 70–99)
Glucose-Capillary: 122 mg/dL — ABNORMAL HIGH (ref 70–99)
Glucose-Capillary: 129 mg/dL — ABNORMAL HIGH (ref 70–99)
Glucose-Capillary: 130 mg/dL — ABNORMAL HIGH (ref 70–99)
Glucose-Capillary: 131 mg/dL — ABNORMAL HIGH (ref 70–99)
Glucose-Capillary: 95 mg/dL (ref 70–99)

## 2022-03-05 NOTE — Plan of Care (Signed)
Problem: Education: Goal: Knowledge of General Education information will improve Description: Including pain rating scale, medication(s)/side effects and non-pharmacologic comfort measures 03/05/2022 0750 by Particia Lather, RN Outcome: Progressing 03/05/2022 0750 by Particia Lather, RN Outcome: Progressing   Problem: Health Behavior/Discharge Planning: Goal: Ability to manage health-related needs will improve 03/05/2022 0750 by Particia Lather, RN Outcome: Progressing 03/05/2022 0750 by Particia Lather, RN Outcome: Progressing   Problem: Clinical Measurements: Goal: Ability to maintain clinical measurements within normal limits will improve 03/05/2022 0750 by Particia Lather, RN Outcome: Progressing 03/05/2022 0750 by Particia Lather, RN Outcome: Progressing Goal: Will remain free from infection 03/05/2022 0750 by Particia Lather, RN Outcome: Progressing 03/05/2022 0750 by Particia Lather, RN Outcome: Progressing Goal: Diagnostic test results will improve 03/05/2022 0750 by Particia Lather, RN Outcome: Progressing 03/05/2022 0750 by Particia Lather, RN Outcome: Progressing Goal: Respiratory complications will improve 03/05/2022 0750 by Particia Lather, RN Outcome: Progressing 03/05/2022 0750 by Particia Lather, RN Outcome: Progressing Goal: Cardiovascular complication will be avoided 03/05/2022 0750 by Particia Lather, RN Outcome: Progressing 03/05/2022 0750 by Particia Lather, RN Outcome: Progressing   Problem: Activity: Goal: Risk for activity intolerance will decrease 03/05/2022 0750 by Particia Lather, RN Outcome: Progressing 03/05/2022 0750 by Particia Lather, RN Outcome: Progressing   Problem: Nutrition: Goal: Adequate nutrition will be maintained 03/05/2022 0750 by Particia Lather, RN Outcome: Progressing 03/05/2022 0750 by Particia Lather, RN Outcome: Progressing   Problem: Coping: Goal: Level of anxiety will decrease 03/05/2022 0750 by Particia Lather, RN Outcome: Progressing 03/05/2022 0750 by Particia Lather, RN Outcome: Progressing   Problem: Elimination: Goal: Will not experience complications related to bowel motility 03/05/2022 0750 by Particia Lather, RN Outcome: Progressing 03/05/2022 0750 by Particia Lather, RN Outcome: Progressing Goal: Will not experience complications related to urinary retention 03/05/2022 0750 by Particia Lather, RN Outcome: Progressing 03/05/2022 0750 by Particia Lather, RN Outcome: Progressing   Problem: Pain Managment: Goal: General experience of comfort will improve 03/05/2022 0750 by Particia Lather, RN Outcome: Progressing 03/05/2022 0750 by Particia Lather, RN Outcome: Progressing   Problem: Safety: Goal: Ability to remain free from injury will improve 03/05/2022 0750 by Particia Lather, RN Outcome: Progressing 03/05/2022 0750 by Particia Lather, RN Outcome: Progressing   Problem: Skin Integrity: Goal: Risk for impaired skin integrity will decrease 03/05/2022 0750 by Particia Lather, RN Outcome: Progressing 03/05/2022 0750 by Particia Lather, RN Outcome: Progressing   Problem: Education: Goal: Understanding of CV disease, CV risk reduction, and recovery process will improve 03/05/2022 0750 by Particia Lather, RN Outcome: Progressing 03/05/2022 0750 by Particia Lather, RN Outcome: Progressing Goal: Individualized Educational Video(s) 03/05/2022 0750 by Particia Lather, RN Outcome: Progressing 03/05/2022 0750 by Particia Lather, RN Outcome: Progressing   Problem: Activity: Goal: Ability to return to baseline activity level will improve 03/05/2022 0750 by Particia Lather, RN Outcome: Progressing 03/05/2022 0750 by Particia Lather, RN Outcome: Progressing   Problem: Cardiovascular: Goal: Ability to achieve and maintain adequate cardiovascular perfusion will improve 03/05/2022 0750 by Particia Lather, RN Outcome: Progressing 03/05/2022 0750 by Particia Lather,  RN Outcome: Progressing Goal: Vascular access site(s) Level 0-1 will be maintained 03/05/2022 0750 by Particia Lather, RN Outcome: Progressing 03/05/2022 0750 by Particia Lather, RN Outcome: Progressing   Problem: Health Behavior/Discharge Planning: Goal: Ability to safely manage health-related needs after  discharge will improve 03/05/2022 0750 by Particia Lather, RN Outcome: Progressing 03/05/2022 0750 by Particia Lather, RN Outcome: Progressing   Problem: Education: Goal: Ability to describe self-care measures that may prevent or decrease complications (Diabetes Survival Skills Education) will improve 03/05/2022 0750 by Particia Lather, RN Outcome: Progressing 03/05/2022 0750 by Particia Lather, RN Outcome: Progressing Goal: Individualized Educational Video(s) 03/05/2022 0750 by Particia Lather, RN Outcome: Progressing 03/05/2022 0750 by Particia Lather, RN Outcome: Progressing   Problem: Coping: Goal: Ability to adjust to condition or change in health will improve 03/05/2022 0750 by Particia Lather, RN Outcome: Progressing 03/05/2022 0750 by Particia Lather, RN Outcome: Progressing   Problem: Fluid Volume: Goal: Ability to maintain a balanced intake and output will improve 03/05/2022 0750 by Particia Lather, RN Outcome: Progressing 03/05/2022 0750 by Particia Lather, RN Outcome: Progressing   Problem: Health Behavior/Discharge Planning: Goal: Ability to identify and utilize available resources and services will improve 03/05/2022 0750 by Particia Lather, RN Outcome: Progressing 03/05/2022 0750 by Particia Lather, RN Outcome: Progressing Goal: Ability to manage health-related needs will improve 03/05/2022 0750 by Particia Lather, RN Outcome: Progressing 03/05/2022 0750 by Particia Lather, RN Outcome: Progressing   Problem: Metabolic: Goal: Ability to maintain appropriate glucose levels will improve 03/05/2022 0750 by Particia Lather, RN Outcome:  Progressing 03/05/2022 0750 by Particia Lather, RN Outcome: Progressing   Problem: Nutritional: Goal: Maintenance of adequate nutrition will improve 03/05/2022 0750 by Particia Lather, RN Outcome: Progressing 03/05/2022 0750 by Particia Lather, RN Outcome: Progressing Goal: Progress toward achieving an optimal weight will improve 03/05/2022 0750 by Particia Lather, RN Outcome: Progressing 03/05/2022 0750 by Particia Lather, RN Outcome: Progressing   Problem: Skin Integrity: Goal: Risk for impaired skin integrity will decrease 03/05/2022 0750 by Particia Lather, RN Outcome: Progressing 03/05/2022 0750 by Particia Lather, RN Outcome: Progressing   Problem: Tissue Perfusion: Goal: Adequacy of tissue perfusion will improve 03/05/2022 0750 by Particia Lather, RN Outcome: Progressing 03/05/2022 0750 by Particia Lather, RN Outcome: Progressing
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
## 2022-03-05 NOTE — Plan of Care (Signed)

## 2022-03-06 LAB — GLUCOSE, CAPILLARY
Glucose-Capillary: 106 mg/dL — ABNORMAL HIGH (ref 70–99)
Glucose-Capillary: 99 mg/dL (ref 70–99)
Glucose-Capillary: 112 mg/dL — ABNORMAL HIGH (ref 70–99)
Glucose-Capillary: 101 mg/dL — ABNORMAL HIGH (ref 70–99)

## 2022-03-06 LAB — VITAMIN B1: Vitamin B1 (Thiamine): 168.3 nmol/L (ref 66.5–200.0)

## 2022-03-06 NOTE — TOC Progression Note (Signed)
Transition of Care (TOC) - Progression Note    Patient Details  Name: Bill Brooks. MRN: [id number] Date of Birth: [birth year]  Transition of Care [facility]) CM/SW Contact  Ella Bodo, RN Phone Number: 03/06/2022, 2:58 PM  Clinical Narrative:    Patient out of restraints, but still has safety sitter at bedside.  He received IV Haldol this morning.  Met with patient's cousin, Malachy Mood, at bedside; she is requesting an update on Medicaid application.  Message sent to financial counseling for update.  SNF placement continues to be difficult due to need for sitter and IV Haldol.  Will follow progress.   Expected Discharge Plan: [place] Barriers to Discharge: Continued Medical Work up  Expected Discharge Plan and Services   Discharge Planning Services: CM Consult Post Acute Care Choice: IP Rehab Living arrangements for the past 2 months: Apartment                                       Social Determinants of Health (SDOH) Interventions SDOH Screenings   Depression (PHQ2-9): Low Risk  (07/23/2018)  Tobacco Use: High Risk (03/01/2022)    Readmission Risk Interventions    01/11/2020   10:04 AM  Readmission Risk Prevention Plan  Post Dischage Appt Complete  Medication Screening Complete  Transportation Screening Complete   Reinaldo Raddle, RN, BSN  Trauma/Neuro ICU Case Manager [phone]
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
## 2022-03-07 LAB — GLUCOSE, CAPILLARY
Glucose-Capillary: 112 mg/dL — ABNORMAL HIGH (ref 70–99)
Glucose-Capillary: 121 mg/dL — ABNORMAL HIGH (ref 70–99)
Glucose-Capillary: 123 mg/dL — ABNORMAL HIGH (ref 70–99)
Glucose-Capillary: 94 mg/dL (ref 70–99)

## 2022-03-07 NOTE — Plan of Care (Signed)

## 2022-03-07 NOTE — TOC Progression Note (Signed)
Transition of Care (TOC) - Progression Note    Patient Details  Name: Bill Brooks. MRN: [id number] Date of Birth: 1960/04/07  [facility]) CM/SW Contact  [name], RN Phone Number: 03/07/2022, 3:30pm  Clinical Narrative:    Brother's FMLA paperwork has been completed and signed by MD.  Virgel Gess to patient's employer, per request.  Original forms given to patient's sister, per brother's request.    Expected Discharge Plan: Skilled Nursing Facility Barriers to Discharge: Continued Medical Work up  Expected Discharge Plan and Services   Discharge Planning Services: CM Consult Post Acute Care Choice: IP Rehab Living arrangements for the past 2 months: Apartment                                       Social Determinants of Health (SDOH) Interventions SDOH Screenings   Depression (PHQ2-9): Low Risk  (07/23/2018)  Tobacco Use: High Risk (03/01/2022)    Readmission Risk Interventions    01/11/2020   10:04 AM  Readmission Risk Prevention Plan  Post Dischage Appt Complete  Medication Screening Complete  Transportation Screening Complete   Reinaldo Raddle, RN, BSN  Trauma/Neuro ICU Case Manager [phone]
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
## 2022-03-08 LAB — GLUCOSE, CAPILLARY
Glucose-Capillary: 102 mg/dL — ABNORMAL HIGH (ref 70–99)
Glucose-Capillary: 117 mg/dL — ABNORMAL HIGH (ref 70–99)
Glucose-Capillary: 118 mg/dL — ABNORMAL HIGH (ref 70–99)
Glucose-Capillary: 142 mg/dL — ABNORMAL HIGH (ref 70–99)
Glucose-Capillary: 74 mg/dL (ref 70–99)
Glucose-Capillary: 96 mg/dL (ref 70–99)

## 2022-03-08 LAB — VALPROIC ACID LEVEL: Valproic Acid Lvl: 38 ug/mL — ABNORMAL LOW (ref 50.0–100.0)

## 2022-03-08 MED ORDER — FREE WATER
100.0000 mL | Freq: Four times a day (QID) | Status: DC
  Administered 2022-03-08 – 2022-03-12 (×15): 100 mL
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
## 2022-03-08 NOTE — Plan of Care (Signed)
Problem: Education: Goal: Knowledge of General Education information will improve Description: Including pain rating scale, medication(s)/side effects and non-pharmacologic comfort measures 03/08/2022 0725 by Particia Lather, RN Outcome: Progressing 03/08/2022 0725 by Particia Lather, RN Outcome: Progressing   Problem: Health Behavior/Discharge Planning: Goal: Ability to manage health-related needs will improve 03/08/2022 0725 by Particia Lather, RN Outcome: Progressing 03/08/2022 0725 by Particia Lather, RN Outcome: Progressing   Problem: Clinical Measurements: Goal: Ability to maintain clinical measurements within normal limits will improve 03/08/2022 0725 by Particia Lather, RN Outcome: Progressing 03/08/2022 0725 by Particia Lather, RN Outcome: Progressing Goal: Will remain free from infection 03/08/2022 0725 by Particia Lather, RN Outcome: Progressing 03/08/2022 0725 by Particia Lather, RN Outcome: Progressing Goal: Diagnostic test results will improve 03/08/2022 0725 by Particia Lather, RN Outcome: Progressing 03/08/2022 0725 by Particia Lather, RN Outcome: Progressing Goal: Respiratory complications will improve 03/08/2022 0725 by Particia Lather, RN Outcome: Progressing 03/08/2022 0725 by Particia Lather, RN Outcome: Progressing Goal: Cardiovascular complication will be avoided 03/08/2022 0725 by Particia Lather, RN Outcome: Progressing 03/08/2022 0725 by Particia Lather, RN Outcome: Progressing   Problem: Activity: Goal: Risk for activity intolerance will decrease 03/08/2022 0725 by Particia Lather, RN Outcome: Progressing 03/08/2022 0725 by Particia Lather, RN Outcome: Progressing   Problem: Nutrition: Goal: Adequate nutrition will be maintained 03/08/2022 0725 by Particia Lather, RN Outcome: Progressing 03/08/2022 0725 by Particia Lather, RN Outcome: Progressing   Problem: Coping: Goal: Level of anxiety will decrease 03/08/2022 0725 by Particia Lather, RN Outcome: Progressing 03/08/2022 0725 by Particia Lather, RN Outcome: Progressing   Problem: Elimination: Goal: Will not experience complications related to bowel motility 03/08/2022 0725 by Particia Lather, RN Outcome: Progressing 03/08/2022 0725 by Particia Lather, RN Outcome: Progressing Goal: Will not experience complications related to urinary retention 03/08/2022 0725 by Particia Lather, RN Outcome: Progressing 03/08/2022 0725 by Particia Lather, RN Outcome: Progressing   Problem: Pain Managment: Goal: General experience of comfort will improve 03/08/2022 0725 by Particia Lather, RN Outcome: Progressing 03/08/2022 0725 by Particia Lather, RN Outcome: Progressing   Problem: Safety: Goal: Ability to remain free from injury will improve 03/08/2022 0725 by Particia Lather, RN Outcome: Progressing 03/08/2022 0725 by Particia Lather, RN Outcome: Progressing   Problem: Skin Integrity: Goal: Risk for impaired skin integrity will decrease 03/08/2022 0725 by Particia Lather, RN Outcome: Progressing 03/08/2022 0725 by Particia Lather, RN Outcome: Progressing   Problem: Education: Goal: Understanding of CV disease, CV risk reduction, and recovery process will improve 03/08/2022 0725 by Particia Lather, RN Outcome: Progressing 03/08/2022 0725 by Particia Lather, RN Outcome: Progressing Goal: Individualized Educational Video(s) 03/08/2022 0725 by Particia Lather, RN Outcome: Progressing 03/08/2022 0725 by Particia Lather, RN Outcome: Progressing   Problem: Activity: Goal: Ability to return to baseline activity level will improve 03/08/2022 0725 by Particia Lather, RN Outcome: Progressing 03/08/2022 0725 by Particia Lather, RN Outcome: Progressing   Problem: Cardiovascular: Goal: Ability to achieve and maintain adequate cardiovascular perfusion will improve 03/08/2022 0725 by Particia Lather, RN Outcome: Progressing 03/08/2022 0725 by Particia Lather,  RN Outcome: Progressing Goal: Vascular access site(s) Level 0-1 will be maintained 03/08/2022 0725 by Particia Lather, RN Outcome: Progressing 03/08/2022 0725 by Particia Lather, RN Outcome: Progressing   Problem: Health Behavior/Discharge Planning: Goal: Ability to safely manage health-related needs after  discharge will improve 03/08/2022 0725 by Particia Lather, RN Outcome: Progressing 03/08/2022 0725 by Particia Lather, RN Outcome: Progressing   Problem: Education: Goal: Ability to describe self-care measures that may prevent or decrease complications (Diabetes Survival Skills Education) will improve 03/08/2022 0725 by Particia Lather, RN Outcome: Progressing 03/08/2022 0725 by Particia Lather, RN Outcome: Progressing Goal: Individualized Educational Video(s) 03/08/2022 0725 by Particia Lather, RN Outcome: Progressing 03/08/2022 0725 by Particia Lather, RN Outcome: Progressing   Problem: Coping: Goal: Ability to adjust to condition or change in health will improve 03/08/2022 0725 by Particia Lather, RN Outcome: Progressing 03/08/2022 0725 by Particia Lather, RN Outcome: Progressing   Problem: Fluid Volume: Goal: Ability to maintain a balanced intake and output will improve 03/08/2022 0725 by Particia Lather, RN Outcome: Progressing 03/08/2022 0725 by Particia Lather, RN Outcome: Progressing   Problem: Health Behavior/Discharge Planning: Goal: Ability to identify and utilize available resources and services will improve 03/08/2022 0725 by Particia Lather, RN Outcome: Progressing 03/08/2022 0725 by Particia Lather, RN Outcome: Progressing Goal: Ability to manage health-related needs will improve 03/08/2022 0725 by Particia Lather, RN Outcome: Progressing 03/08/2022 0725 by Particia Lather, RN Outcome: Progressing   Problem: Metabolic: Goal: Ability to maintain appropriate glucose levels will improve 03/08/2022 0725 by Particia Lather, RN Outcome:  Progressing 03/08/2022 0725 by Particia Lather, RN Outcome: Progressing   Problem: Nutritional: Goal: Maintenance of adequate nutrition will improve 03/08/2022 0725 by Particia Lather, RN Outcome: Progressing 03/08/2022 0725 by Particia Lather, RN Outcome: Progressing Goal: Progress toward achieving an optimal weight will improve 03/08/2022 0725 by Particia Lather, RN Outcome: Progressing 03/08/2022 0725 by Particia Lather, RN Outcome: Progressing   Problem: Skin Integrity: Goal: Risk for impaired skin integrity will decrease 03/08/2022 0725 by Particia Lather, RN Outcome: Progressing 03/08/2022 0725 by Particia Lather, RN Outcome: Progressing   Problem: Tissue Perfusion: Goal: Adequacy of tissue perfusion will improve 03/08/2022 0725 by Particia Lather, RN Outcome: Progressing 03/08/2022 0725 by Particia Lather, RN Outcome: Progressing
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
## 2022-03-08 NOTE — Plan of Care (Signed)

## 2022-03-08 NOTE — Progress Notes (Signed)
Palliative: Chart review completed.  [name] continues to slowly improve.  He is being considered for rehab.  Transition of care team is working for placement.  Conference with attending, bedside nursing staff, transition of care team related to patient condition, needs, goals of care, disposition.  Plan: At this point continue full scope/full code.  Time for outcomes.  Anticipating long-term care placement.  No charge Quinn Axe, NP Palliative medicine team Team phone [phone]  Greater than 50% of this time was spent counseling and coordinating care related to the above assessment and plan.

## 2022-03-09 DIAGNOSIS — E43 Unspecified severe protein-calorie malnutrition: Secondary | ICD-10-CM

## 2022-03-09 LAB — GLUCOSE, CAPILLARY
Glucose-Capillary: 100 mg/dL — ABNORMAL HIGH (ref 70–99)
Glucose-Capillary: 106 mg/dL — ABNORMAL HIGH (ref 70–99)
Glucose-Capillary: 107 mg/dL — ABNORMAL HIGH (ref 70–99)
Glucose-Capillary: 135 mg/dL — ABNORMAL HIGH (ref 70–99)
Glucose-Capillary: 144 mg/dL — ABNORMAL HIGH (ref 70–99)
Glucose-Capillary: 79 mg/dL (ref 70–99)

## 2022-03-09 MED ORDER — JEVITY 1.5 CAL/FIBER PO LIQD
1000.0000 mL | ORAL | Status: DC
  Administered 2022-03-09 – 2022-03-11 (×3): 1000 mL
  Filled 2022-03-09 (×3): qty 1000

## 2022-03-09 MED ORDER — PROSOURCE TF20 ENFIT COMPATIBL EN LIQD
60.0000 mL | Freq: Three times a day (TID) | ENTERAL | Status: DC
  Administered 2022-03-09 – 2022-03-12 (×11): 60 mL
  Filled 2022-03-09 (×10): qty 60
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
## 2022-03-09 NOTE — Progress Notes (Signed)
Palliative:  Chart review completed.  Overall, Mr. Rinn has shown much progress over the last week.  He is being considered for rehab.  Transition of care team is working for rehab placement.   Conference with bedside nursing staff, transition of care team related to patient condition, needs, goals of care, disposition.   Plan: At this point continue full scope/full code.  Time for outcomes.   TOC team is working diligently for short-term rehab placement. Palliative medicine team to sign off.  Please write reconsult as needed.  No charge   Quinn Axe, NP Palliative medicine team Team phone [phone]  Greater than 50% of this time was spent counseling and coordinating care related to the above assessment and plan.

## 2022-03-09 NOTE — Plan of Care (Signed)
  Problem: Clinical Measurements: Goal: Will remain free from infection Outcome: Progressing Goal: Diagnostic test results will improve Outcome: Progressing Goal: Respiratory complications will improve Outcome: Progressing   Problem: Nutrition: Goal: Adequate nutrition will be maintained Outcome: Progressing   

## 2022-03-09 NOTE — Progress Notes (Signed)
Nutrition Brief Note  RD received a secure chat from surgery PA. Team would like to transition to nocturnal feeds since pt PO intake has improved. RD recommend going to continuous feeds at night. PA ok with RD adjusting TF orders.   Tube Feeds via G-tube: - Jevity 1.5 at 83 mL x 12 hours (1800 to 0600) - 60 mL ProSource TF20 - TID - 100 mL free water flush - four times per day - Provides 1740 kcal, 124 gm protein, and 1160 mL total free water daily.   RD to continue to follow pt PO intake and TF.   Hermina Barters RD, LDN Clinical Dietitian See Shea Evans for contact information.
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
## 2022-03-10 LAB — GLUCOSE, CAPILLARY
Glucose-Capillary: 134 mg/dL — ABNORMAL HIGH (ref 70–99)
Glucose-Capillary: 103 mg/dL — ABNORMAL HIGH (ref 70–99)
Glucose-Capillary: 165 mg/dL — ABNORMAL HIGH (ref 70–99)
Glucose-Capillary: 96 mg/dL (ref 70–99)
Glucose-Capillary: 74 mg/dL (ref 70–99)
Glucose-Capillary: 96 mg/dL (ref 70–99)
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
## 2022-03-11 LAB — GLUCOSE, CAPILLARY
Glucose-Capillary: 110 mg/dL — ABNORMAL HIGH (ref 70–99)
Glucose-Capillary: 116 mg/dL — ABNORMAL HIGH (ref 70–99)
Glucose-Capillary: 118 mg/dL — ABNORMAL HIGH (ref 70–99)
Glucose-Capillary: 128 mg/dL — ABNORMAL HIGH (ref 70–99)
Glucose-Capillary: 137 mg/dL — ABNORMAL HIGH (ref 70–99)
Glucose-Capillary: 99 mg/dL (ref 70–99)
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
## 2022-03-12 LAB — GLUCOSE, CAPILLARY
Glucose-Capillary: 104 mg/dL — ABNORMAL HIGH (ref 70–99)
Glucose-Capillary: 122 mg/dL — ABNORMAL HIGH (ref 70–99)
Glucose-Capillary: 124 mg/dL — ABNORMAL HIGH (ref 70–99)
Glucose-Capillary: 129 mg/dL — ABNORMAL HIGH (ref 70–99)
Glucose-Capillary: 130 mg/dL — ABNORMAL HIGH (ref 70–99)

## 2022-03-12 MED ORDER — QUETIAPINE FUMARATE 200 MG PO TABS
200.0000 mg | ORAL_TABLET | Freq: Every day | ORAL | Status: DC
  Administered 2022-03-12: 200 mg
  Filled 2022-03-12: qty 1

## 2022-03-12 MED ORDER — CLONAZEPAM 0.5 MG PO TABS
0.5000 mg | ORAL_TABLET | Freq: Three times a day (TID) | ORAL | Status: DC
  Administered 2022-03-12 (×2): 0.5 mg
  Filled 2022-03-12 (×2): qty 1

## 2022-03-12 MED ORDER — METOPROLOL TARTRATE 5 MG/5ML IV SOLN: 5.0000 mg | Freq: Three times a day (TID) | INTRAVENOUS | Status: DC | PRN
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
## 2022-03-12 NOTE — Progress Notes (Signed)
Mobility Specialist Progress Note   03/12/22 1215  Mobility  Activity Moved into chair position in bed (AROM/PROM)  Level of Assistance Dependent, patient does less than 25%  Range of Motion/Exercises Active;Right arm;Left arm;Right leg;Left leg  Activity Response Tolerated well   Patient received in supine but at an angle towards the side bed rail.  Deferred OOB until after receiving lunch but Agreeable to readjust in bed. Repositioned pt with nearly total A, patient only assisted by pushing with LE's. Performed UE & LE AROM/PROM. Tolerated without complaint or incident. Was left with all needs met, call bell in reach. Will return later to attempt transfer.  [place] Derric Dealmeida, BS EXP Mobility Specialist Please contact via SecureChat or Rehab office at [phone]

## 2022-03-13 LAB — GLUCOSE, CAPILLARY
Glucose-Capillary: 90 mg/dL (ref 70–99)
Glucose-Capillary: 125 mg/dL — ABNORMAL HIGH (ref 70–99)
Glucose-Capillary: 143 mg/dL — ABNORMAL HIGH (ref 70–99)

## 2022-03-13 MED ORDER — SENNA 8.6 MG PO TABS: 2.0000 | ORAL_TABLET | Freq: Every day | ORAL | Status: DC | PRN

## 2022-03-13 MED ORDER — FAMOTIDINE 20 MG PO TABS
20.0000 mg | ORAL_TABLET | Freq: Two times a day (BID) | ORAL | Status: DC
  Administered 2022-03-13 – 2022-03-16 (×7): 20 mg via ORAL
  Filled 2022-03-13 (×7): qty 1

## 2022-03-13 MED ORDER — IBUPROFEN 400 MG PO TABS: 400.0000 mg | ORAL_TABLET | Freq: Three times a day (TID) | ORAL | Status: DC | PRN

## 2022-03-13 MED ORDER — BOOST / RESOURCE BREEZE PO LIQD CUSTOM
1.0000 | Freq: Three times a day (TID) | ORAL | Status: DC
  Administered 2022-03-13 – 2022-03-14 (×6): 1 via ORAL

## 2022-03-13 MED ORDER — LORAZEPAM 0.5 MG PO TABS: 0.5000 mg | ORAL_TABLET | ORAL | Status: DC | PRN

## 2022-03-13 MED ORDER — POLYETHYLENE GLYCOL 3350 17 G PO PACK
17.0000 g | PACK | Freq: Every day | ORAL | Status: DC
  Administered 2022-03-15 – 2022-03-16 (×2): 17 g via ORAL
  Filled 2022-03-13 (×2): qty 1

## 2022-03-13 MED ORDER — THIAMINE MONONITRATE 100 MG PO TABS
100.0000 mg | ORAL_TABLET | Freq: Every day | ORAL | Status: DC
  Administered 2022-03-13 – 2022-03-16 (×4): 100 mg via ORAL
  Filled 2022-03-13 (×4): qty 1

## 2022-03-13 MED ORDER — DOCUSATE SODIUM 100 MG PO CAPS
100.0000 mg | ORAL_CAPSULE | Freq: Two times a day (BID) | ORAL | Status: DC
  Administered 2022-03-13 – 2022-03-16 (×6): 100 mg via ORAL
  Filled 2022-03-13 (×6): qty 1

## 2022-03-13 MED ORDER — ADULT MULTIVITAMIN W/MINERALS CH
1.0000 | ORAL_TABLET | Freq: Every day | ORAL | Status: DC
  Administered 2022-03-13 – 2022-03-16 (×4): 1 via ORAL
  Filled 2022-03-13 (×4): qty 1

## 2022-03-13 MED ORDER — VALPROIC ACID 250 MG/5ML PO SOLN
500.0000 mg | Freq: Three times a day (TID) | ORAL | Status: DC
  Filled 2022-03-13: qty 10

## 2022-03-13 MED ORDER — DIVALPROEX SODIUM 500 MG PO DR TAB
500.0000 mg | DELAYED_RELEASE_TABLET | Freq: Three times a day (TID) | ORAL | Status: DC
  Administered 2022-03-13 – 2022-03-16 (×10): 500 mg via ORAL
  Filled 2022-03-13 (×12): qty 1

## 2022-03-13 MED ORDER — ENSURE ENLIVE PO LIQD
237.0000 mL | Freq: Two times a day (BID) | ORAL | Status: DC
  Administered 2022-03-13 – 2022-03-14 (×4): 237 mL via ORAL

## 2022-03-13 MED ORDER — METHOCARBAMOL 500 MG PO TABS: 1000.0000 mg | ORAL_TABLET | Freq: Three times a day (TID) | ORAL | Status: DC | PRN

## 2022-03-13 MED ORDER — VITAMIN D 25 MCG (1000 UNIT) PO TABS
1000.0000 [IU] | ORAL_TABLET | Freq: Every day | ORAL | Status: DC
  Administered 2022-03-13 – 2022-03-16 (×4): 1000 [IU] via ORAL
  Filled 2022-03-13 (×4): qty 1

## 2022-03-13 MED ORDER — CLONAZEPAM 0.5 MG PO TABS
0.5000 mg | ORAL_TABLET | Freq: Three times a day (TID) | ORAL | Status: DC
  Administered 2022-03-13 – 2022-03-16 (×11): 0.5 mg via ORAL
  Filled 2022-03-13 (×11): qty 1

## 2022-03-13 MED ORDER — ACETAMINOPHEN 325 MG PO TABS: 650.0000 mg | ORAL_TABLET | Freq: Four times a day (QID) | ORAL | Status: DC | PRN

## 2022-03-13 MED ORDER — FOLIC ACID 1 MG PO TABS
1.0000 mg | ORAL_TABLET | Freq: Every day | ORAL | Status: DC
  Administered 2022-03-13 – 2022-03-16 (×4): 1 mg via ORAL
  Filled 2022-03-13 (×4): qty 1

## 2022-03-13 MED ORDER — QUETIAPINE FUMARATE 200 MG PO TABS
200.0000 mg | ORAL_TABLET | Freq: Every day | ORAL | Status: DC
  Administered 2022-03-13 – 2022-03-15 (×3): 200 mg via ORAL
  Filled 2022-03-13 (×3): qty 1

## 2022-03-13 NOTE — Plan of Care (Signed)
  Problem: Health Behavior/Discharge Planning: Goal: Ability to manage health-related needs will improve Outcome: Progressing   Problem: Clinical Measurements: Goal: Ability to maintain clinical measurements within normal limits will improve Outcome: Progressing Goal: Will remain free from infection 03/13/2022 0736 by Darrelyn Hillock, RN Outcome: Progressing 03/13/2022 0735 by Darrelyn Hillock, RN Outcome: Progressing Goal: Diagnostic test results will improve Outcome: Progressing   Problem: Coping: Goal: Level of anxiety will decrease Outcome: Progressing

## 2022-03-13 NOTE — Plan of Care (Signed)
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
## 2022-03-13 NOTE — Progress Notes (Signed)
Trauma Event Note    Reason for Call : Notified by Primary RN that patient had pulled out his g-tube around 2330. TRN attempted to place foley in the place of the g-tube but was unsuccessful. Trauma MD notified.    Last imported Vital Signs BP 110/80 (BP Location: Left Arm)   Pulse 79   Temp 98 F (36.7 C)   Resp 16   Ht 6\' 1"  (1.854 m)   Wt 68.6 kg   SpO2 100%   BMI 19.95 kg/m   Trending CBC No results for input(s): "WBC", "HGB", "HCT", "PLT" in the last 72 hours.  Trending Coag's No results for input(s): "APTT", "INR" in the last 72 hours.  Trending BMET No results for input(s): "NA", "K", "CL", "CO2", "BUN", "CREATININE", "GLUCOSE" in the last 72 hours.    Trudee Kuster  Trauma Response RN  Please call TRN at [phone] for further assistance.
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
## 2022-03-13 NOTE — Progress Notes (Signed)
   03/12/22 2354  Provider Notification  Provider Name/Title Dr. Bobbye Morton  Date Provider Notified 03/12/22  Time Provider Notified 7782  Method of Notification Page (secured chat as well)  Notification Reason Other (Comment) (patient accidentaly pulled gtube out)  Provider response See new orders (ordered to place a foley in the g tube site)  Date of Provider Response 03/13/22  Time of Provider Response 0400

## 2022-03-14 LAB — GLUCOSE, CAPILLARY
Glucose-Capillary: 105 mg/dL — ABNORMAL HIGH (ref 70–99)
Glucose-Capillary: 123 mg/dL — ABNORMAL HIGH (ref 70–99)
Glucose-Capillary: 135 mg/dL — ABNORMAL HIGH (ref 70–99)
Glucose-Capillary: 133 mg/dL — ABNORMAL HIGH (ref 70–99)
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
## 2022-03-14 NOTE — Progress Notes (Signed)
   03/14/22 1911  Vitals  Temp 98.1 F (36.7 C)  Temp Source Oral  BP 107/82  MAP (mmHg) 91  BP Location Left Arm  BP Method Automatic  Patient Position (if appropriate) Sitting  Pulse Rate (!) 123  Pulse Rate Source Monitor  Resp 18  Level of Consciousness  Level of Consciousness Alert  MEWS COLOR  MEWS Score Color Yellow  Oxygen Therapy  SpO2 99 %  O2 Device Room Air  MEWS Score  MEWS Temp 0  MEWS Systolic 0  MEWS Pulse 2  MEWS RR 0  MEWS LOC 0  MEWS Score 2   Patient in chair and transferred to bed. A&O, calm & cooperative.

## 2022-03-15 LAB — GLUCOSE, CAPILLARY
Glucose-Capillary: 124 mg/dL — ABNORMAL HIGH (ref 70–99)
Glucose-Capillary: 138 mg/dL — ABNORMAL HIGH (ref 70–99)
Glucose-Capillary: 142 mg/dL — ABNORMAL HIGH (ref 70–99)
Glucose-Capillary: 157 mg/dL — ABNORMAL HIGH (ref 70–99)
Glucose-Capillary: 157 mg/dL — ABNORMAL HIGH (ref 70–99)
Glucose-Capillary: 168 mg/dL — ABNORMAL HIGH (ref 70–99)

## 2022-03-15 MED ORDER — ENSURE ENLIVE PO LIQD
237.0000 mL | Freq: Three times a day (TID) | ORAL | Status: DC
  Administered 2022-03-15 – 2022-03-16 (×4): 237 mL via ORAL
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
## 2022-03-15 NOTE — Progress Notes (Signed)
Mobility Specialist - Progress Note   03/15/22 1500  Mobility  Activity Transferred from chair to bed  Level of Assistance Moderate assist, patient does 50-74%  Assistive Device Front wheel walker  Distance Ambulated (ft) 2 ft  Activity Response Tolerated well  Mobility Referral Yes  $Mobility charge 1 Mobility    Pt received in recliner requesting to get back to bed. ModA to stand from recliner. Left in bed w/ call bell in reach and all needs met.   [place] Specialist Please contact via SecureChat or Rehab office at [phone]
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
## 2022-03-15 NOTE — TOC Progression Note (Signed)
Transition of Care (TOC) - Progression Note    Patient Details  Name: Bill Brooks. MRN: [id number] Date of Birth: 19-Nov-1960  [facility] CM/SW Contact  Ella Bodo, RN Phone Number: 03/15/2022, 4:03 PM  Clinical Narrative:    Patient making great progress with therapies, and is motivated to get better.  PT has changed recommendation to inpatient rehab, and patient agreeable to pursuing this.  Spoke with patient's sister, Threasa Beards: She states that her brother is working on getting a Corporate investment banker apartment, and has applied for a leave of absence from work.  Threasa Beards states that she can provide 24-hour assistance for patient until her brother has living arrangements in place.  Family committed to assisting patient upon discharge; will follow for rehab consult.   Expected Discharge Plan: IP Rehab Facility Barriers to Discharge: Continued Medical Work up  Expected Discharge Plan and Services   Discharge Planning Services: CM Consult Post Acute Care Choice: IP Rehab Living arrangements for the past 2 months: Apartment                                       Social Determinants of Health (SDOH) Interventions SDOH Screenings   Depression (PHQ2-9): Low Risk  (07/23/2018)  Tobacco Use: High Risk (03/01/2022)    Readmission Risk Interventions    01/11/2020   10:04 AM  Readmission Risk Prevention Plan  Post Dischage Appt Complete  Medication Screening Complete  Transportation Screening Complete   Reinaldo Raddle, RN, BSN  Trauma/Neuro ICU Case Manager [phone]
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
## 2022-03-16 ENCOUNTER — Other Ambulatory Visit: Payer: Self-pay

## 2022-03-16 ENCOUNTER — Inpatient Hospital Stay (HOSPITAL_COMMUNITY)
Admission: RE | Admit: 2022-03-16 | Discharge: 2022-03-27 | DRG: 945 | Disposition: A | Discharge status: Home Health | Payer: Self-pay | Source: Intra-hospital | Attending: Physical Medicine and Rehabilitation | Admitting: Physical Medicine and Rehabilitation

## 2022-03-16 ENCOUNTER — Encounter (HOSPITAL_COMMUNITY): Payer: Self-pay | Admitting: Physical Medicine and Rehabilitation

## 2022-03-16 ENCOUNTER — Encounter (HOSPITAL_COMMUNITY): Payer: Self-pay | Admitting: General Surgery

## 2022-03-16 DIAGNOSIS — S069X0S Unspecified intracranial injury without loss of consciousness, sequela: Secondary | ICD-10-CM

## 2022-03-16 DIAGNOSIS — S42001S Fracture of unspecified part of right clavicle, sequela: Secondary | ICD-10-CM

## 2022-03-16 DIAGNOSIS — Z833 Family history of diabetes mellitus: Secondary | ICD-10-CM

## 2022-03-16 DIAGNOSIS — F17210 Nicotine dependence, cigarettes, uncomplicated: Secondary | ICD-10-CM | POA: Diagnosis present

## 2022-03-16 DIAGNOSIS — M25511 Pain in right shoulder: Secondary | ICD-10-CM | POA: Diagnosis present

## 2022-03-16 DIAGNOSIS — M19031 Primary osteoarthritis, right wrist: Secondary | ICD-10-CM | POA: Diagnosis present

## 2022-03-16 DIAGNOSIS — R3915 Urgency of urination: Secondary | ICD-10-CM | POA: Diagnosis not present

## 2022-03-16 DIAGNOSIS — F1910 Other psychoactive substance abuse, uncomplicated: Secondary | ICD-10-CM

## 2022-03-16 DIAGNOSIS — M25531 Pain in right wrist: Secondary | ICD-10-CM | POA: Insufficient documentation

## 2022-03-16 DIAGNOSIS — F419 Anxiety disorder, unspecified: Secondary | ICD-10-CM | POA: Diagnosis present

## 2022-03-16 DIAGNOSIS — S065XAS Traumatic subdural hemorrhage with loss of consciousness status unknown, sequela: Secondary | ICD-10-CM

## 2022-03-16 DIAGNOSIS — D62 Acute posthemorrhagic anemia: Secondary | ICD-10-CM | POA: Diagnosis present

## 2022-03-16 DIAGNOSIS — R339 Retention of urine, unspecified: Secondary | ICD-10-CM | POA: Diagnosis present

## 2022-03-16 DIAGNOSIS — G621 Alcoholic polyneuropathy: Secondary | ICD-10-CM | POA: Diagnosis present

## 2022-03-16 DIAGNOSIS — R251 Tremor, unspecified: Secondary | ICD-10-CM | POA: Insufficient documentation

## 2022-03-16 DIAGNOSIS — R32 Unspecified urinary incontinence: Secondary | ICD-10-CM | POA: Diagnosis not present

## 2022-03-16 DIAGNOSIS — G4700 Insomnia, unspecified: Secondary | ICD-10-CM | POA: Diagnosis present

## 2022-03-16 DIAGNOSIS — R4189 Other symptoms and signs involving cognitive functions and awareness: Secondary | ICD-10-CM | POA: Diagnosis present

## 2022-03-16 DIAGNOSIS — R531 Weakness: Principal | ICD-10-CM | POA: Diagnosis present

## 2022-03-16 DIAGNOSIS — M25532 Pain in left wrist: Secondary | ICD-10-CM | POA: Insufficient documentation

## 2022-03-16 DIAGNOSIS — F1210 Cannabis abuse, uncomplicated: Secondary | ICD-10-CM | POA: Diagnosis present

## 2022-03-16 DIAGNOSIS — S069XAA Unspecified intracranial injury with loss of consciousness status unknown, initial encounter: Secondary | ICD-10-CM | POA: Diagnosis present

## 2022-03-16 DIAGNOSIS — R269 Unspecified abnormalities of gait and mobility: Secondary | ICD-10-CM | POA: Diagnosis present

## 2022-03-16 DIAGNOSIS — R451 Restlessness and agitation: Secondary | ICD-10-CM | POA: Diagnosis present

## 2022-03-16 DIAGNOSIS — F1010 Alcohol abuse, uncomplicated: Secondary | ICD-10-CM | POA: Diagnosis present

## 2022-03-16 DIAGNOSIS — F1410 Cocaine abuse, uncomplicated: Secondary | ICD-10-CM | POA: Diagnosis present

## 2022-03-16 DIAGNOSIS — G252 Other specified forms of tremor: Secondary | ICD-10-CM | POA: Diagnosis present

## 2022-03-16 DIAGNOSIS — M545 Low back pain, unspecified: Secondary | ICD-10-CM | POA: Diagnosis not present

## 2022-03-16 DIAGNOSIS — I959 Hypotension, unspecified: Secondary | ICD-10-CM | POA: Diagnosis not present

## 2022-03-16 DIAGNOSIS — M25512 Pain in left shoulder: Secondary | ICD-10-CM | POA: Diagnosis present

## 2022-03-16 DIAGNOSIS — Z8781 Personal history of (healed) traumatic fracture: Secondary | ICD-10-CM

## 2022-03-16 DIAGNOSIS — M19032 Primary osteoarthritis, left wrist: Secondary | ICD-10-CM | POA: Diagnosis present

## 2022-03-16 DIAGNOSIS — R000 Tachycardia, unspecified: Secondary | ICD-10-CM | POA: Diagnosis present

## 2022-03-16 DIAGNOSIS — R739 Hyperglycemia, unspecified: Secondary | ICD-10-CM | POA: Diagnosis present

## 2022-03-16 DIAGNOSIS — J439 Emphysema, unspecified: Secondary | ICD-10-CM | POA: Diagnosis present

## 2022-03-16 DIAGNOSIS — T07XXXA Unspecified multiple injuries, initial encounter: Secondary | ICD-10-CM

## 2022-03-16 DIAGNOSIS — S42102S Fracture of unspecified part of scapula, left shoulder, sequela: Secondary | ICD-10-CM

## 2022-03-16 LAB — GLUCOSE, CAPILLARY
Glucose-Capillary: 145 mg/dL — ABNORMAL HIGH (ref 70–99)
Glucose-Capillary: 103 mg/dL — ABNORMAL HIGH (ref 70–99)
Glucose-Capillary: 116 mg/dL — ABNORMAL HIGH (ref 70–99)
Glucose-Capillary: 112 mg/dL — ABNORMAL HIGH (ref 70–99)

## 2022-03-16 MED ORDER — DIPHENHYDRAMINE HCL 12.5 MG/5ML PO ELIX
12.5000 mg | ORAL_SOLUTION | Freq: Four times a day (QID) | ORAL | Status: DC | PRN
  Administered 2022-03-16 – 2022-03-17 (×2): 25 mg via ORAL
  Filled 2022-03-16 (×2): qty 10

## 2022-03-16 MED ORDER — ALUM & MAG HYDROXIDE-SIMETH 200-200-20 MG/5ML PO SUSP: 30.0000 mL | ORAL | Status: DC | PRN

## 2022-03-16 MED ORDER — PROCHLORPERAZINE EDISYLATE 10 MG/2ML IJ SOLN: 5.0000 mg | Freq: Four times a day (QID) | INTRAMUSCULAR | Status: DC | PRN

## 2022-03-16 MED ORDER — GUAIFENESIN-DM 100-10 MG/5ML PO SYRP: 5.0000 mL | ORAL_SOLUTION | Freq: Four times a day (QID) | ORAL | Status: DC | PRN

## 2022-03-16 MED ORDER — METHOCARBAMOL 500 MG PO TABS
1000.0000 mg | ORAL_TABLET | Freq: Three times a day (TID) | ORAL | Status: DC | PRN
  Administered 2022-03-18 – 2022-03-22 (×6): 1000 mg via ORAL
  Filled 2022-03-16 (×6): qty 2

## 2022-03-16 MED ORDER — ADULT MULTIVITAMIN W/MINERALS CH
1.0000 | ORAL_TABLET | Freq: Every day | ORAL | Status: DC
  Administered 2022-03-17 – 2022-03-27 (×11): 1 via ORAL
  Filled 2022-03-16 (×11): qty 1

## 2022-03-16 MED ORDER — ENOXAPARIN SODIUM 30 MG/0.3ML IJ SOSY
30.0000 mg | PREFILLED_SYRINGE | Freq: Two times a day (BID) | INTRAMUSCULAR | Status: DC
  Administered 2022-03-16 – 2022-03-27 (×22): 30 mg via SUBCUTANEOUS
  Filled 2022-03-16 (×22): qty 0.3

## 2022-03-16 MED ORDER — TAMSULOSIN HCL 0.4 MG PO CAPS
0.4000 mg | ORAL_CAPSULE | Freq: Every day | ORAL | Status: DC
  Administered 2022-03-17 – 2022-03-22 (×6): 0.4 mg via ORAL
  Filled 2022-03-16 (×6): qty 1

## 2022-03-16 MED ORDER — ORAL CARE MOUTH RINSE: 15.0000 mL | OROMUCOSAL | Status: DC | PRN

## 2022-03-16 MED ORDER — CLONAZEPAM 0.5 MG PO TABS
0.5000 mg | ORAL_TABLET | Freq: Three times a day (TID) | ORAL | Status: DC
  Administered 2022-03-16 – 2022-03-21 (×14): 0.5 mg via ORAL
  Filled 2022-03-16 (×14): qty 1

## 2022-03-16 MED ORDER — VITAMIN D 25 MCG (1000 UNIT) PO TABS
1000.0000 [IU] | ORAL_TABLET | Freq: Every day | ORAL | Status: DC
  Administered 2022-03-17 – 2022-03-27 (×11): 1000 [IU] via ORAL
  Filled 2022-03-16 (×11): qty 1

## 2022-03-16 MED ORDER — ENSURE ENLIVE PO LIQD
237.0000 mL | Freq: Three times a day (TID) | ORAL | Status: DC
  Administered 2022-03-17 – 2022-03-27 (×21): 237 mL via ORAL

## 2022-03-16 MED ORDER — IBUPROFEN 400 MG PO TABS
400.0000 mg | ORAL_TABLET | Freq: Three times a day (TID) | ORAL | Status: DC | PRN
  Administered 2022-03-19 – 2022-03-25 (×6): 400 mg via ORAL
  Filled 2022-03-16 (×8): qty 1

## 2022-03-16 MED ORDER — QUETIAPINE FUMARATE 50 MG PO TABS
200.0000 mg | ORAL_TABLET | Freq: Every day | ORAL | Status: DC
  Administered 2022-03-16 – 2022-03-26 (×11): 200 mg via ORAL
  Filled 2022-03-16 (×12): qty 4

## 2022-03-16 MED ORDER — ALBUTEROL SULFATE (2.5 MG/3ML) 0.083% IN NEBU: 2.5000 mg | INHALATION_SOLUTION | Freq: Four times a day (QID) | RESPIRATORY_TRACT | Status: DC | PRN

## 2022-03-16 MED ORDER — ACETAMINOPHEN 325 MG PO TABS
650.0000 mg | ORAL_TABLET | Freq: Three times a day (TID) | ORAL | Status: DC
  Administered 2022-03-16 – 2022-03-23 (×29): 650 mg via ORAL
  Filled 2022-03-16 (×28): qty 2

## 2022-03-16 MED ORDER — DIVALPROEX SODIUM 250 MG PO DR TAB
500.0000 mg | DELAYED_RELEASE_TABLET | Freq: Three times a day (TID) | ORAL | Status: DC
  Administered 2022-03-17 – 2022-03-23 (×20): 500 mg via ORAL
  Filled 2022-03-16 (×20): qty 2

## 2022-03-16 MED ORDER — ORAL CARE MOUTH RINSE
15.0000 mL | OROMUCOSAL | Status: DC
  Administered 2022-03-17 – 2022-03-27 (×31): 15 mL via OROMUCOSAL

## 2022-03-16 MED ORDER — BISACODYL 10 MG RE SUPP: 10.0000 mg | Freq: Every day | RECTAL | Status: DC | PRN

## 2022-03-16 MED ORDER — POLYETHYLENE GLYCOL 3350 17 G PO PACK
17.0000 g | PACK | Freq: Every day | ORAL | Status: DC
  Administered 2022-03-19 – 2022-03-27 (×6): 17 g via ORAL
  Filled 2022-03-16 (×10): qty 1

## 2022-03-16 MED ORDER — PROCHLORPERAZINE MALEATE 5 MG PO TABS: 5.0000 mg | ORAL_TABLET | Freq: Four times a day (QID) | ORAL | Status: DC | PRN

## 2022-03-16 MED ORDER — POLYETHYLENE GLYCOL 3350 17 G PO PACK: 17.0000 g | PACK | Freq: Every day | ORAL | Status: DC | PRN

## 2022-03-16 MED ORDER — FLEET ENEMA 7-19 GM/118ML RE ENEM: 1.0000 | ENEMA | Freq: Once | RECTAL | Status: DC | PRN

## 2022-03-16 MED ORDER — FOLIC ACID 1 MG PO TABS
1.0000 mg | ORAL_TABLET | Freq: Every day | ORAL | Status: DC
  Administered 2022-03-17 – 2022-03-27 (×11): 1 mg via ORAL
  Filled 2022-03-16 (×11): qty 1

## 2022-03-16 MED ORDER — SENNOSIDES-DOCUSATE SODIUM 8.6-50 MG PO TABS
2.0000 | ORAL_TABLET | Freq: Every day | ORAL | Status: DC
  Administered 2022-03-17 – 2022-03-26 (×10): 2 via ORAL
  Filled 2022-03-16 (×12): qty 2

## 2022-03-16 MED ORDER — THIAMINE MONONITRATE 100 MG PO TABS
100.0000 mg | ORAL_TABLET | Freq: Every day | ORAL | Status: DC
  Administered 2022-03-17 – 2022-03-27 (×11): 100 mg via ORAL
  Filled 2022-03-16 (×11): qty 1

## 2022-03-16 MED ORDER — PROCHLORPERAZINE 25 MG RE SUPP: 12.5000 mg | Freq: Four times a day (QID) | RECTAL | Status: DC | PRN

## 2022-03-16 MED ORDER — ACETAMINOPHEN 325 MG PO TABS
325.0000 mg | ORAL_TABLET | ORAL | Status: DC | PRN
  Administered 2022-03-22 – 2022-03-26 (×4): 650 mg via ORAL
  Filled 2022-03-16 (×4): qty 2

## 2022-03-16 MED ORDER — TRAZODONE HCL 50 MG PO TABS
25.0000 mg | ORAL_TABLET | Freq: Every evening | ORAL | Status: DC | PRN
  Administered 2022-03-18 – 2022-03-26 (×7): 50 mg via ORAL
  Filled 2022-03-16 (×7): qty 1

## 2022-03-16 MED ORDER — FAMOTIDINE 20 MG PO TABS
20.0000 mg | ORAL_TABLET | Freq: Two times a day (BID) | ORAL | Status: DC
  Administered 2022-03-16 – 2022-03-27 (×22): 20 mg via ORAL
  Filled 2022-03-16 (×22): qty 1
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
## 2022-03-16 NOTE — TOC Transition Note (Signed)
Transition of Care [facility]) - CM/SW Discharge Note   Patient Details  Name: Bill Brooks. MRN: [id number] Date of Birth: 02-12-1961  [facility] CM/SW Contact:  Ella Bodo, RN Phone Number: 03/16/2022, 4:05 PM   Clinical Narrative:    Patient medically stable for discharge, and has been accepted for admission to [facility] inpatient rehab.  Patient's sister to provide needed assistance at discharge.   Final next level of care: IP Rehab Facility Barriers to Discharge: Barriers Resolved   Patient Goals and CMS Choice CMS Medicare.gov Compare Post Acute Care list provided to:: Patient Choice offered to / list presented to : Sibling                          Discharge Plan and Services Additional resources added to the After Visit Summary for     Discharge Planning Services: CM Consult Post Acute Care Choice: IP Rehab                               Social Determinants of Health (SDOH) Interventions SDOH Screenings   Depression (PHQ2-9): Low Risk  (07/23/2018)  Tobacco Use: High Risk (03/01/2022)     Readmission Risk Interventions    01/11/2020   10:04 AM  Readmission Risk Prevention Plan  Post Dischage Appt Complete  Medication Screening Complete  Transportation Screening Complete   Reinaldo Raddle, RN, BSN  Trauma/Neuro ICU Case Manager [phone]

## 2022-03-16 NOTE — Progress Notes (Signed)
Inpatient Rehab Admissions Coordinator:  There is a bed available for pt in CIR today. Alferd Apa, PA aware and in agreement. Pt, pt's sister Threasa Beards, NSG, and TOC also made aware.   Gayland Curry, [place], [place] Admissions Coordinator [phone]

## 2022-03-16 NOTE — Progress Notes (Signed)
Inpatient Rehabilitation Admission Medication Review by a Pharmacist  A complete drug regimen review was completed for this patient to identify any potential clinically significant medication issues.  High Risk Drug Classes Is patient taking? Indication by Medication  Antipsychotic Yes Seroquel - agitation Compazine - N/V  Anticoagulant Yes Lovenox - DVT px  Antibiotic No   Opioid No   Antiplatelet No   Hypoglycemics/insulin No   Vasoactive Medication No   Chemotherapy No   Other Yes Folic acid/thiamine - Wernicke's  Flomax - BPH Famotidine - reflux Depakote - Sz Vit D - supplementation Trazodone - sleep Ibuprofen - pain Klonopin - anxiety Robaxin - spasms     Type of Medication Issue Identified Description of Issue Recommendation(s)  Drug Interaction(s) (clinically significant)     Duplicate Therapy     Allergy     No Medication Administration End Date     Incorrect Dose     Additional Drug Therapy Needed     Significant med changes from prior encounter (inform family/care partners about these prior to discharge).    Other       Clinically significant medication issues were identified that warrant physician communication and completion of prescribed/recommended actions by midnight of the next day:  No  Name of provider notified for urgent issues identified:   Provider Method of Notification:     Pharmacist comments:   Time spent performing this drug regimen review (minutes):  [place], PharmD, [place], AAHIVP, CPP Infectious Disease Pharmacist 03/16/2022 6:52 PM
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
## 2022-03-16 NOTE — Progress Notes (Signed)
Inpatient Rehab Admissions:  Inpatient Rehab Consult received.  I met with patient at the bedside for rehabilitation assessment and to discuss goals and expectations of an inpatient rehab admission.  Discussed average length of stay and discharge home after completion of CIR. Pt acknowledged understanding. Pt interested in pursuing CIR. Pt gave permission to contact sister Threasa Beards. Spoke with Threasa Beards on the telephone. She acknowledged understanding of CIR goals and expectations. She is supportive of pt pursuing CIR. She confirmed that she will be able to provide 24/7 support for pt after discharge. Will continue to follow.  Signed: Gayland Curry, [place], [place] Admissions Coordinator [phone]

## 2022-03-17 DIAGNOSIS — S069X0S Unspecified intracranial injury without loss of consciousness, sequela: Secondary | ICD-10-CM

## 2022-03-17 NOTE — Plan of Care (Signed)
  Problem: RH Balance Goal: LTG: Patient will maintain dynamic sitting balance (OT) Description: LTG:  Patient will maintain dynamic sitting balance with assistance during activities of daily living (OT) Flowsheets (Taken 03/17/2022 1359) LTG: Pt will maintain dynamic sitting balance during ADLs with: Independent Goal: LTG Patient will maintain dynamic standing with ADLs (OT) Description: LTG:  Patient will maintain dynamic standing balance with assist during activities of daily living (OT)  Flowsheets (Taken 03/17/2022 1359) LTG: Pt will maintain dynamic standing balance during ADLs with: Supervision/Verbal cueing   Problem: Sit to Stand Goal: LTG:  Patient will perform sit to stand in prep for activites of daily living with assistance level (OT) Description: LTG:  Patient will perform sit to stand in prep for activites of daily living with assistance level (OT) Flowsheets (Taken 03/17/2022 1359) LTG: PT will perform sit to stand in prep for activites of daily living with assistance level: Supervision/Verbal cueing   Problem: RH Eating Goal: LTG Patient will perform eating w/assist, cues/equip (OT) Description: LTG: Patient will perform eating with assist, with/without cues using equipment (OT) Flowsheets (Taken 03/17/2022 1359) LTG: Pt will perform eating with assistance level of: Independent with assistive device    Problem: RH Grooming Goal: LTG Patient will perform grooming w/assist,cues/equip (OT) Description: LTG: Patient will perform grooming with assist, with/without cues using equipment (OT) Flowsheets (Taken 03/17/2022 1359) LTG: Pt will perform grooming with assistance level of: Independent with assistive device    Problem: RH Bathing Goal: LTG Patient will bathe all body parts with assist levels (OT) Description: LTG: Patient will bathe all body parts with assist levels (OT) Flowsheets (Taken 03/17/2022 1359) LTG: Pt will perform bathing with assistance level/cueing:  Supervision/Verbal cueing   Problem: RH Dressing Goal: LTG Patient will perform upper body dressing (OT) Description: LTG Patient will perform upper body dressing with assist, with/without cues (OT). Flowsheets (Taken 03/17/2022 1359) LTG: Pt will perform upper body dressing with assistance level of: Supervision/Verbal cueing Goal: LTG Patient will perform lower body dressing w/assist (OT) Description: LTG: Patient will perform lower body dressing with assist, with/without cues in positioning using equipment (OT) Flowsheets (Taken 03/17/2022 1359) LTG: Pt will perform lower body dressing with assistance level of: Supervision/Verbal cueing   Problem: RH Toileting Goal: LTG Patient will perform toileting task (3/3 steps) with assistance level (OT) Description: LTG: Patient will perform toileting task (3/3 steps) with assistance level (OT)  Flowsheets (Taken 03/17/2022 1359) LTG: Pt will perform toileting task (3/3 steps) with assistance level: Supervision/Verbal cueing   Problem: RH Functional Use of Upper Extremity Goal: LTG Patient will use RT/LT upper extremity as a (OT) Description: LTG: Patient will use right/left upper extremity as a stabilizer/gross assist/diminished/nondominant/dominant level with assist, with/without cues during functional activity (OT) Flowsheets (Taken 03/17/2022 1359) LTG: Use of upper extremity in functional activities:  RUE as nondominant level  LUE as nondominant level   Problem: RH Toilet Transfers Goal: LTG Patient will perform toilet transfers w/assist (OT) Description: LTG: Patient will perform toilet transfers with assist, with/without cues using equipment (OT) Flowsheets (Taken 03/17/2022 1359) LTG: Pt will perform toilet transfers with assistance level of: Supervision/Verbal cueing   Problem: RH Tub/Shower Transfers Goal: LTG Patient will perform tub/shower transfers w/assist (OT) Description: LTG: Patient will perform tub/shower transfers with  assist, with/without cues using equipment (OT) Flowsheets (Taken 03/17/2022 1359) LTG: Pt will perform tub/shower stall transfers with assistance level of: Supervision/Verbal cueing
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
## 2022-03-17 NOTE — Progress Notes (Signed)
PROGRESS NOTE   Subjective/Complaints:  Feels ok, RN in room for meds not nsg issues thus far  Pt ha B shoulder pain , was not aware of hx of R clavicular and left scapular fx (Sept  2023) ROS- neg CP, SOB, N/V/D Objective:   No results found. No results for input(s): "WBC", "HGB", "HCT", "PLT" in the last 72 hours. No results for input(s): "NA", "K", "CL", "CO2", "GLUCOSE", "BUN", "CREATININE", "CALCIUM" in the last 72 hours.  Intake/Output Summary (Last 24 hours) at 03/17/2022 1447 Last data filed at 03/16/2022 2144 Gross per 24 hour  Intake --  Output 200 ml  Net -200 ml        Physical Exam: Vital Signs Blood pressure 103/82, pulse 97, temperature 98.3 F (36.8 C), resp. rate 18, height 6\' 1"  (1.854 m), weight 72.2 kg, SpO2 100 %.   General: No acute distress Mood and affect are appropriate Heart: Regular rate and rhythm no rubs murmurs or extra sounds Lungs: Clear to auscultation, breathing unlabored, no rales or wheezes Abdomen: Positive bowel sounds, soft nontender to palpation, nondistended Extremities: No clubbing, cyanosis, or edema Skin: No evidence of breakdown, no evidence of rash Neurologic: Cranial nerves II through XII intact, motor strength is 5/5 in bilateral deltoid, bicep, tricep, grip, hip flexor, knee extensors, ankle dorsiflexor and plantar flexor  Musculoskeletal: Full range of motion in all 4 extremities. No joint swelling   Assessment/Plan: 1. Functional deficits which require 3+ hours per day of interdisciplinary therapy in a comprehensive inpatient rehab setting. Physiatrist is providing close team supervision and 24 hour management of active medical problems listed below. Physiatrist and rehab team continue to assess barriers to discharge/monitor patient progress toward functional and medical goals  Care Tool:  Bathing              Bathing assist       Upper Body  Dressing/Undressing Upper body dressing        Upper body assist      Lower Body Dressing/Undressing Lower body dressing            Lower body assist       Toileting Toileting    Toileting assist       Transfers Chair/bed transfer  Transfers assist     Chair/bed transfer assist level: Moderate Assistance - Patient 50 - 74%     Locomotion Ambulation   Ambulation assist      Assist level: Minimal Assistance - Patient > 75% Assistive device: Walker-rolling Max distance: 20   Walk 10 feet activity   Assist     Assist level: Minimal Assistance - Patient > 75% Assistive device: Walker-rolling   Walk 50 feet activity   Assist Walk 50 feet with 2 turns activity did not occur: Safety/medical concerns         Walk 150 feet activity   Assist Walk 150 feet activity did not occur: Safety/medical concerns         Walk 10 feet on uneven surface  activity   Assist     Assist level: Minimal Assistance - Patient > 75% (ramp) Assistive device: Chemical engineer     Assist Is  the patient using a wheelchair?: Yes Type of Wheelchair: Manual    Wheelchair assist level: Contact Guard/Touching assist Max wheelchair distance: 20    Wheelchair 50 feet with 2 turns activity    Assist        Assist Level: Maximal Assistance - Patient 25 - 49%   Wheelchair 150 feet activity     Assist      Assist Level: Total Assistance - Patient < 25%   Blood pressure 103/82, pulse 97, temperature 98.3 F (36.8 C), resp. rate 18, height 6\' 1"  (1.854 m), weight 72.2 kg, SpO2 100 %.  Medical Problem List and Plan: 1. 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   Antithrombotics: -DVT/anticoagulation:  Pharmaceutical: Lovenox             -antiplatelet therapy: N/A 3. Pain Management: Will schedule tylenol qid --continue advil prn.  4. Mood/Behavior/Sleep: LCSW to follow for evaluation and support.             --Continue Seroquel at nights for sleep/agitation. Sleep wake chart.              -antipsychotic agents:  Seroquel. 5. Neuropsych/cognition: This patient is capable of making decisions on his own behalf. 6. Skin/Wound Care: Routine pressure relief measures.              --continue protein supplements.  7. Fluids/Electrolytes/Nutrition: Monitor I/O. Check CMET in am 8. Acute on chronic SDH:  has resolved and has been seizure free.             --agitation/delirium resolved? Continue Klonopin 0.5 mg TID (decreased ib 01/08) ---Seroquel 200 mg/HS (decreased 01/080             --Depakote increased to 500 mg TID on 12/28. Ammonia level slightly elevated- 37 on 12/27. Recheck 01/15 9. Tachycardia: Monitor HR/BP TID- continue lopressor.  10. VDRF s/p trach/rib Fx, R-PTX: Respiratory status stable.  11.  Urinary retention: Monitor voiding. Continue Flomax for now. 12.  ABLA: Stable. Will recheck on 01/15.  13. H/o polysubstance abuse including THC, cocaine, and EOTH: On Thiamine daily              --Vitamin B1, Vitamin B12 and Vitamin D levels WNL.             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 Hyperglycemia: Hgb A1c-5.6 and off tube feeds.  Will discontinue CBG checks.     LOS: 1 days A FACE TO FACE EVALUATION WAS PERFORMED  Charlett Blake 03/17/2022, 2:47 PM

## 2022-03-17 NOTE — Progress Notes (Addendum)
Clarified Contact Prevention Sign re: MRSA PCR per [facility] [employer] Infection prevention specialist may take out sign outside  door. No need for isolation.

## 2022-03-18 DIAGNOSIS — S069XAD Unspecified intracranial injury with loss of consciousness status unknown, subsequent encounter: Secondary | ICD-10-CM
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
## 2022-03-19 DIAGNOSIS — S069X9S Unspecified intracranial injury with loss of consciousness of unspecified duration, sequela: Secondary | ICD-10-CM

## 2022-03-19 LAB — COMPREHENSIVE METABOLIC PANEL
ALT: 22 U/L (ref 0–44)
AST: 14 U/L — ABNORMAL LOW (ref 15–41)
Albumin: 2.9 g/dL — ABNORMAL LOW (ref 3.5–5.0)
Alkaline Phosphatase: 65 U/L (ref 38–126)
Anion gap: 8 (ref 5–15)
BUN: 23 mg/dL (ref 8–23)
CO2: 23 mmol/L (ref 22–32)
Calcium: 9 mg/dL (ref 8.9–10.3)
Chloride: 105 mmol/L (ref 98–111)
Creatinine, Ser: 0.95 mg/dL (ref 0.61–1.24)
GFR, Estimated: >60 mL/min (ref >60)
Glucose, Bld: 104 mg/dL — ABNORMAL HIGH (ref 70–99)
Potassium: 3.8 mmol/L (ref 3.5–5.1)
Sodium: 136 mmol/L (ref 135–145)
Total Bilirubin: 0.3 mg/dL (ref 0.3–1.2)
Total Protein: 6.6 g/dL (ref 6.5–8.1)

## 2022-03-19 LAB — CBC WITH DIFFERENTIAL/PLATELET
Abs Immature Granulocytes: 0 10*3/uL (ref 0.00–0.07)
Basophils Absolute: 0 10*3/uL (ref 0.0–0.1)
Basophils Relative: 0 %
Eosinophils Absolute: 0.4 10*3/uL (ref 0.0–0.5)
Eosinophils Relative: 4 %
HCT: 29.8 % — ABNORMAL LOW (ref 39.0–52.0)
Hemoglobin: 9.3 g/dL — ABNORMAL LOW (ref 13.0–17.0)
Lymphocytes Relative: 51 %
Lymphs Abs: 5 10*3/uL — ABNORMAL HIGH (ref 0.7–4.0)
MCH: 27.9 pg (ref 26.0–34.0)
MCHC: 31.2 g/dL (ref 30.0–36.0)
MCV: 89.5 fL (ref 80.0–100.0)
Monocytes Absolute: 0.2 10*3/uL (ref 0.1–1.0)
Monocytes Relative: 2 %
Neutro Abs: 4.3 10*3/uL (ref 1.7–7.7)
Neutrophils Relative %: 43 %
Platelets: 219 10*3/uL (ref 150–400)
RBC: 3.33 MIL/uL — ABNORMAL LOW (ref 4.22–5.81)
RDW: 18.5 % — ABNORMAL HIGH (ref 11.5–15.5)
WBC: 9.9 10*3/uL (ref 4.0–10.5)
nRBC: 0 % (ref 0.0–0.2)
nRBC: 0 /100 WBC

## 2022-03-19 LAB — AMMONIA: Ammonia: 34 umol/L (ref 9–35)

## 2022-03-19 LAB — MRSA NEXT GEN BY PCR, NASAL: MRSA by PCR Next Gen: NOT DETECTED

## 2022-03-19 MED ORDER — DICLOFENAC SODIUM 1 % EX GEL
4.0000 g | Freq: Four times a day (QID) | CUTANEOUS | Status: DC
  Administered 2022-03-19 – 2022-03-27 (×24): 4 g via TOPICAL
  Filled 2022-03-19: qty 100

## 2022-03-19 NOTE — Progress Notes (Signed)
Patient ID: Bill Brooks., male   DOB: 12-29-1960, 62 y.o.   MRN: [id number] Met with the patient to review current situation, rehab process, team conference and plan of care. Reviewed current medications, smoking cessation and dietary modification recommendations and pain management options.  Reviewed lovenox injections; clarification of timing pending. MRSA PCR completed per nursing. Continue to follow along to address educational needs to facilitate preparation for discharge. Margarito Liner

## 2022-03-19 NOTE — Discharge Instructions (Addendum)
Inpatient Rehab Discharge Instructions  Bill Brooks. Discharge date and time:  03/27/22  Activities/Precautions/ Functional Status: Activity: no lifting, driving, or strenuous exercise till cleared by MD Diet: regular diet Wound Care: keep wound clean and dry   Functional status:  ___ No restrictions     ___ Walk up steps independently _X__ 24/7 supervision/assistance   ___ Walk up steps with assistance ___ Intermittent supervision/assistance  ___ Bathe/dress independently ___ Walk with walker     ___ Bathe/dress with assistance ___ Walk Independently    ___ Shower independently ___ Walk with assistance    ___ Shower with assistance _X__ No alcohol     ___ Return to work/school ________     COMMUNITY REFERRALS UPON DISCHARGE:    Home Health:   PT     OT     ST                      Agency: [facility] Phone: [phone]    Medical Equipment/Items Ordered: [name], Bedside Commode, Tub Transfer Bench                                                 Agency/Supplier: [employer] [phone]  Special Instructions: Family needs to help and make sure that medications are taken as prescribed.     My questions have been answered and I understand these instructions. I will adhere to these goals and the provided educational materials after my discharge from the hospital.  Patient/Caregiver Signature _______________________________ Date __________  Clinician Signature _______________________________________ Date __________  Please bring this form and your medication list with you to all your follow-up doctor's appointments.
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
## 2022-03-19 NOTE — Progress Notes (Signed)
Occupational Therapy TBI Note  Patient Details  Name: Bill Brooks. MRN: [id number] Date of Birth: Oct 03, 1960  Session 1 Today's Date: 03/19/2022 OT Individual Time: 5102-5852 OT Individual Time Calculation (min): 75 min    Session 2 Today's Date: 03/19/2022 OT Individual Time: 1420-1500 OT Individual Time Calculation (min): 40 min     Short Term Goals: Week 1:  OT Short Term Goal 1 (Week 1): Patient will be given built up handles to increase independence with self-feeding and grooming tasks. OT Short Term Goal 2 (Week 1): Patient will tolerate standing at the sink for 2 minutes in preparation for BADL tasks. OT Short Term Goal 3 (Week 1): Patient will demonstrate improved grip strength by opening deodorant lid.  Skilled Therapeutic Interventions/Progress Updates:   Session 1   Pt received supine with no c/o pain, agreeable to OT session. He was insistent on waiting for breakfast tray to arrive- checked in with nursing staff and pt did eat breakfast. He came to EOB and tray did arrive so he ate BLT sandwich and OT got an ensure supplement after checking in with RN. Self feeding with (S) overall. He was agreeable to take a shower this session. He completed functional mobility into the bathroom with the RW with min A overall. He required cueing for safety awareness and sequencing. He was oriented to self, place, year but not month (said June). Behaviors all very appropriate during session. He required mod A to doff clothing. Lots of tightness in B shoulders (contractures), lumbar spine, and hamstrings. He completed UB bathing with min A to reach thoroughly under the BLE. Min A for standing balance while completing peri hygiene. He returned to the w/c with min A. He donned a shirt with mod A and pants with mod A to thread over feet. He completed 100 ft of functional mobility to the therapy gym with the RW with CGA. He transitioned into supine and sidelying on the mat. OT completed extensive  manual therapy on his BUE- with scapular mobilizations on B scapulas and working shoulders into flexion and abduction. With high repetitions and extra time and use of heat, was able to get B shoulders to 90 degrees. He required rest breaks d/t pain but overall reported he was able to tolerate all PROM. Pt returned to his room and was left sitting EOB with bed alarm set.     Session 2 Pt received sitting EOB with no c/o pain, agreeable to OT session. PT reported that pt had no memory of any session with OT this morning an hour later or eating breakfast. Will get memory notebook. Pt completed 200 ft of functional mobility to the therapy gym with CGA. Focused initially on BUE shoulder AROM in gravity reduced positioning- sliding forward into 90 degrees of flexion onto elevated table. He required min-mod facilitation overall. He attempted finger ladder but lacked the [facility] to advance arm without max facilitation. He then transferred into prone on the mat to work on posterior chain lengthening. He was able to tolerate position for about 4 min with OT facilitating positioning before coming into supine to rest. He completed 3x10 glute bridges in supine to strengthen posterior chain to facilitate more upright posture. He returned to his w/c and to his room. OT provided memory notebook and initiated use. Pt left sitting EOB with all needs met.   ABS 15  Therapy Documentation Precautions:  Precautions Precautions: Fall Restrictions Weight Bearing Restrictions: Yes RUE Weight Bearing: Weight bearing as tolerated RUE Partial Weight  Bearing Percentage or Pounds: RUE now WBAT LUE Weight Bearing: Weight bearing as tolerated Other Position/Activity Restrictions: No ROM restrictions Agitated Behavior Scale: TBI   Agitated Behavior Scale (DO NOT LEAVE BLANKS) Short attention span, easy distractibility, inability to concentrate: Present to a slight degree Impulsive, impatient, low tolerance for pain or  frustration: Absent Uncooperative, resistant to care, demanding: Absent Violent and/or threatening violence toward people or property: Absent Explosive and/or unpredictable anger: Absent Rocking, rubbing, moaning, or other self-stimulating behavior: Absent Pulling at tubes, restraints, etc.: Absent Wandering from treatment areas: Absent Restlessness, pacing, excessive movement: Absent Repetitive behaviors, motor, and/or verbal: Absent Rapid, loud, or excessive talking: Absent Sudden changes of mood: Absent Easily initiated or excessive crying and/or laughter: Absent Self-abusiveness, physical and/or verbal: Absent Agitated behavior scale total score: 15   Therapy/Group: Individual Therapy  Curtis Sites 03/19/2022, 6:18 AM

## 2022-03-19 NOTE — Progress Notes (Signed)
Inpatient Rehabilitation  Patient information reviewed and entered into eRehab system by Damany Eastman Magaly Pollina, OTR/L, Rehab Quality Coordinator.   Information including medical coding, functional ability and quality indicators will be reviewed and updated through discharge.   

## 2022-03-19 NOTE — Progress Notes (Signed)
Mobile Individual Statement of Services  Patient Name:  Bill Brooks.  Date:  03/19/2022  Welcome to the [facility].  Our goal is to provide you with an individualized program based on your diagnosis and situation, designed to meet your specific needs.  With this comprehensive rehabilitation program, you will be expected to participate in at least 3 hours of rehabilitation therapies Monday-Friday, with modified therapy programming on the weekends.  Your rehabilitation program will include the following services:  Physical Therapy (PT), Occupational Therapy (OT), Speech Therapy (ST), 24 hour per day rehabilitation nursing, Therapeutic Recreaction (TR), Neuropsychology, Care Coordinator, Rehabilitation Medicine, Nutrition Services, Pharmacy Services, and Other  Weekly team conferences will be held on Wednesdays to discuss your progress.  Your Inpatient Rehabilitation Care Coordinator will talk with you frequently to get your input and to update you on team discussions.  Team conferences with you and your family in attendance may also be held.  Expected length of stay: 7-10 Days  Overall anticipated outcome:  Supervision  Depending on your progress and recovery, your program may change. Your Inpatient Rehabilitation Care Coordinator will coordinate services and will keep you informed of any changes. Your Inpatient Rehabilitation Care Coordinator's name and contact numbers are listed  below.  The following services may also be recommended but are not provided by the [facility]:   [place] will be made to provide these services after discharge if needed.  Arrangements include referral to agencies that provide these services.  Your insurance has been verified to be:  MED PAY  Your primary doctor is:  NO PCP  Pertinent information will be shared with  your doctor and your insurance company.  Inpatient Rehabilitation Care Coordinator:  Erlene Quan, [place] or [phone]  Information discussed with and copy given to patient by: Dyanne Iha, 03/19/2022, 11:04 AM
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
## 2022-03-19 NOTE — IPOC Note (Signed)
Overall Plan of Care (IPOC) Patient Details Name: Bill Brooks. MRN: [id number] DOB: 1960/09/13  Admitting Diagnosis: TBI (traumatic brain injury) [facility])  Hospital Problems: Principal Problem:   TBI (traumatic brain injury) ([place])     Functional Problem List: Nursing Bladder, Bowel, Safety, Endurance, Skin Integrity, Pain  PT Balance, Safety, Endurance, Motor  OT Balance, Behavior, Cognition, Endurance, Motor, Nutrition, Pain, Safety, Sensory  SLP    TR         Basic ADL's: OT Eating, Grooming, Bathing, Dressing, Toileting     Advanced  ADL's: OT       Transfers: PT Bed Mobility, Bed to Chair, Car, Manufacturing systems engineer, Metallurgist: PT Ambulation, Data processing manager, Emergency planning/management officer     Additional Impairments: OT Fuctional Use of Upper Extremity  SLP        TR      Anticipated Outcomes Item Anticipated Outcome  Self Feeding Mod I  Swallowing      Basic self-care  Supervision  Toileting  Supervision   Bathroom Transfers Supervision  Bowel/Bladder  manage bowel  and bladder with mod I assist  Transfers  supervisionOOB, modi bed mobility  Locomotion  supervision w/ LRAD  Communication     Cognition     Pain  < 4 with prns  Safety/Judgment  manage w cues   Therapy Plan: PT Intensity: Minimum of 1-2 x/day ,45 to 90 minutes PT Frequency: 5 out of 7 days PT Duration Estimated Length of Stay: 12-14 days. OT Intensity: Minimum of 1-2 x/day, 45 to 90 minutes OT Frequency: 5 out of 7 days OT Duration/Estimated Length of Stay: 2 weeks     Team Interventions: Nursing Interventions Patient/Family Education, Pain Management, Bladder Management, Medication Management, Discharge Planning, Bowel Management, Skin Care/Wound Management, Disease Management/Prevention  PT interventions Ambulation/gait training, Community reintegration, Neuromuscular re-education, Stair training, UE/LE Strength taining/ROM, Wheelchair propulsion/positioning, Discharge  planning, Therapeutic Activities, UE/LE Coordination activities, Functional mobility training, Patient/family education, Therapeutic Exercise  OT Interventions Cognitive remediation/compensation, Community reintegration, Discharge planning, Disease mangement/prevention, DME/adaptive equipment instruction, Functional electrical stimulation, Functional mobility training, Neuromuscular re-education, Pain management, Patient/family education, Psychosocial support, Self Care/advanced ADL retraining, Skin care/wound managment, Splinting/orthotics, Therapeutic Activities, Therapeutic Exercise, UE/LE Strength taining/ROM, UE/LE Coordination activities, Visual/perceptual remediation/compensation, Wheelchair propulsion/positioning, Training and development officer  SLP Interventions    TR Interventions    SW/CM Interventions Discharge Planning, Psychosocial Support, Patient/Family Education, Disease Management/Prevention   Barriers to Discharge MD  Medical stability, Home enviroment access/loayout, and Behavior  Nursing Lack of/limited family support, Home environment access/layout 1 level 13 ste bil rails w sister  PT Decreased caregiver support    OT Decreased caregiver support Cargive support is unclear at this time  SLP      [place] for SNF coverage, Home environment access/layout No steps to enter home   Team Discharge Planning: Destination: PT-Home ,OT- Home , SLP-  Projected Follow-up: PT-Home health PT, OT-  Home health OT, 24 hour supervision/assistance (vs OPOT), SLP-  Projected Equipment Needs: PT- , OT- To be determined, SLP-  Equipment Details: PT-TBD, pt states has canes at home., OT-  Patient/family involved in discharge planning: PT- Patient,  OT-Patient, SLP-   MD ELOS: 7-12 Medical Rehab Prognosis:  Excellent Assessment: The patient has been admitted for CIR therapies with the diagnosis of TBI with polytrauma. The team will be addressing functional mobility, strength, stamina,  balance, safety, adaptive techniques and equipment, self-care, bowel and bladder mgt, patient and caregiver education. Goals have been set at  supervision. Anticipated discharge destination is home.        See Team Conference Notes for weekly updates to the plan of care

## 2022-03-20 DIAGNOSIS — G629 Polyneuropathy, unspecified: Secondary | ICD-10-CM

## 2022-03-20 LAB — HEMOGLOBIN AND HEMATOCRIT, BLOOD
HCT: 29.6 % — ABNORMAL LOW (ref 39.0–52.0)
Hemoglobin: 9.6 g/dL — ABNORMAL LOW (ref 13.0–17.0)

## 2022-03-20 MED ORDER — MAGNESIUM GLUCONATE 500 MG PO TABS
250.0000 mg | ORAL_TABLET | Freq: Every day | ORAL | Status: DC
  Administered 2022-03-20 – 2022-03-26 (×7): 250 mg via ORAL
  Filled 2022-03-20 (×7): qty 1

## 2022-03-20 NOTE — Progress Notes (Signed)
Patient ID: Bill Brooks., male   DOB: [birth year], 63 y.o.   MRN: [id number]  Patient requesting to see SW to discuss foodstamps. Patient informed to reach out to CM to resume services at d/c. Sw will provide patient with a d/c date tomorrow for patient to provide to CM or with SW at bedside. No additional questions or concerns.

## 2022-03-20 NOTE — Progress Notes (Signed)
PROGRESS NOTE   Subjective/Complaints: + low back pain Family at bedside Discussed my role in rehab, team meeting tomorrow  ROS: +low back pain   Objective:   No results found. Recent Labs    03/19/22 0619 03/20/22 0551  WBC 9.9  --   HGB 9.3* 9.6*  HCT 29.8* 29.6*  PLT 219  --    Recent Labs    03/19/22 0619  NA 136  K 3.8  CL 105  CO2 23  GLUCOSE 104*  BUN 23  CREATININE 0.95  CALCIUM 9.0    Intake/Output Summary (Last 24 hours) at 03/20/2022 0950 Last data filed at 03/20/2022 0824 Gross per 24 hour  Intake 1071 ml  Output 400 ml  Net 671 ml        Physical Exam: Vital Signs Blood pressure 110/89, pulse 86, temperature 97.8 F (36.6 C), temperature source Oral, resp. rate 16, height 6\' 1"  (1.854 m), weight 72.2 kg, SpO2 100 %.   General: No acute distress, BMI 21 Mood and affect are appropriate Heart: Regular rate and rhythm no rubs murmurs or extra sounds Lungs: Clear to auscultation, breathing unlabored, no rales or wheezes Abdomen: Positive bowel sounds, soft nontender to palpation, nondistended Extremities: No clubbing, cyanosis. Mild nonpitting localized swelling of bilateral wrists. ROM reduced in bilateral wrist extension. No TTP.  Skin: No evidence of breakdown, no evidence of rash Neurologic: Cranial nerves II through XII intact,  BL UE intention tremor on FTN testing. Reslexes 2+ BL UE.   Musculoskeletal: motor strength is 5/5 in bilateral UE and Les throughout.   Assessment/Plan: 1. Functional deficits which require 3+ hours per day of interdisciplinary therapy in a comprehensive inpatient rehab setting. Physiatrist is providing close team supervision and 24 hour management of active medical problems listed below. Physiatrist and rehab team continue to assess barriers to discharge/monitor patient progress toward functional and medical goals  Care Tool:  Bathing    Body parts  bathed by patient: Chest, Abdomen, Front perineal area, Right upper leg, Left upper leg, Face   Body parts bathed by helper: Left lower leg, Right lower leg, Buttocks, Left arm, Right arm     Bathing assist Assist Level: Maximal Assistance - Patient 24 - 49%     Upper Body Dressing/Undressing Upper body dressing   What is the patient wearing?: Pull over shirt    Upper body assist Assist Level: Maximal Assistance - Patient 25 - 49%    Lower Body Dressing/Undressing Lower body dressing      What is the patient wearing?: Pants     Lower body assist Assist for lower body dressing: Maximal Assistance - Patient 25 - 49%     Toileting Toileting    Toileting assist Assist for toileting: Moderate Assistance - Patient 50 - 74%     Transfers Chair/bed transfer  Transfers assist     Chair/bed transfer assist level: Minimal Assistance - Patient > 75%     Locomotion Ambulation   Ambulation assist      Assist level: Contact Guard/Touching assist Assistive device: Walker-rolling Max distance: 147ft   Walk 10 feet activity   Assist     Assist level: Contact Guard/Touching  assist Assistive device: Walker-rolling   Walk 50 feet activity   Assist Walk 50 feet with 2 turns activity did not occur: Safety/medical concerns  Assist level: Contact Guard/Touching assist Assistive device: Walker-rolling    Walk 150 feet activity   Assist Walk 150 feet activity did not occur: Safety/medical concerns  Assist level: Minimal Assistance - Patient > 75% Assistive device: Walker-rolling    Walk 10 feet on uneven surface  activity   Assist     Assist level: Minimal Assistance - Patient > 75% (ramp) Assistive device: Walker-rolling   Wheelchair     Assist Is the patient using a wheelchair?: Yes Type of Wheelchair: Manual    Wheelchair assist level: Contact Guard/Touching assist Max wheelchair distance: 20    Wheelchair 50 feet with 2 turns  activity    Assist        Assist Level: Maximal Assistance - Patient 25 - 49%   Wheelchair 150 feet activity     Assist      Assist Level: Total Assistance - Patient < 25%   Blood pressure 110/89, pulse 86, temperature 97.8 F (36.6 C), temperature source Oral, resp. rate 16, height 6\' 1"  (1.854 m), weight 72.2 kg, SpO2 100 %.  Medical Problem List and Plan: 1. Functional deficits secondary to TBI with polytrauma, prolonged hospital course             -pt s/p ORIF right humeral shaft, right clavicle, left scapula fxs on 9/18                         -WBAT BUE             -patient may shower             -ELOS/Goals: 7-12 days, supervision goals  Discussed team meeting tomorrow 2.  Antithrombotics: -DVT/anticoagulation:  Pharmaceutical: Lovenox             -antiplatelet therapy: N/A 3. Pain Management: Will schedule tylenol qid --continue advil prn.  - 1/15: Voltaren gel QID to bilateral wrists for pain  4. Mood/Behavior/Sleep: LCSW to follow for evaluation and support.             --Continue Seroquel at nights for sleep/agitation. Sleep wake chart.              -antipsychotic agents:  Seroquel. 5. Neuropsych/cognition: This patient is capable of making decisions on his own behalf. 6. Skin/Wound Care: Routine pressure relief measures.              --continue protein supplements.  7. Fluids/Electrolytes/Nutrition: Monitor I/O. Check CMET in am 8. Acute on chronic SDH:  has resolved and has been seizure free.             --agitation/delirium resolved? Continue Klonopin 0.5 mg TID (decreased ib 01/08) ---Seroquel 200 mg/HS (decreased 01/080             --Depakote increased to 500 mg TID on 12/28. Ammonia level slightly elevated- 37 on 12/27. Recheck 01/15 9. Tachycardia: Monitor HR/BP TID- continue lopressor.  10. VDRF s/p trach/rib Fx, R-PTX: Respiratory status stable.  11.  Urinary retention: Monitor voiding. Continue Flomax for now. 12.  ABLA: Discussed improvement  in Hgb 1/16.   13. H/o polysubstance abuse including THC, cocaine, and EOTH: On Thiamine daily              --Vitamin B1, Vitamin B12 and Vitamin D levels WNL.  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 Hyperglycemia: Hgb A1c-5.6 and off tube feeds.  Will discontinue CBG checks. Start magnesium gluconate 250mg  HS 15. Low back pain: kpad ordered.     LOS: 4 days A FACE TO FACE EVALUATION WAS PERFORMED  Bill Brooks 03/20/2022, 9:50 AM
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
## 2022-03-20 NOTE — Progress Notes (Signed)
Physical Therapy TBI Note  Patient Details  Name: Bill Brooks. MRN: [id number] Date of Birth: 11-03-1960  Today's Date: 03/20/2022 PT Individual Time: 0731-0826 and 1400-1457 PT Individual Time Calculation (min): 55 min and 57 min  Short Term Goals: Week 1:  PT Short Term Goal 1 (Week 1): Pt will transfer sup to sit w/ CGA PT Short Term Goal 2 (Week 1): Pt will transfer sit to stand w/ min A from low heights. PT Short Term Goal 3 (Week 1): Pt will amb w/ RW and CGA x 75'  Skilled Therapeutic Interventions/Progress Updates:   Treatment Session 1 Received pt sitting EOB eating breakfast. Pt agreeable to PT treatment and denied any pain at rest but reported increased back pain with mobility - RN notified and requested k-pad from MD. Session with emphasis on dressing, functional mobility/transfers, generalized strengthening and endurance, dynamic standing balance/coordination, and gait training. While pt ate muffin, banana, cereal, and orange juice (with supervision), PT located scrub clothes and pt doffed dirty gown and donned scrub top with mod A. Pt required max/total A to don socks, shoes, and thread legs through pants - pt's sister then arrived for remainder of session to observe. Pt's sister reports plan is for pt to come stay with her and that she is able to provide 24/7 supervision. She also reported pt has no steps to navigate in/out of the home.   Stood with RW and mod A and able to pull pants over hips with CGA. Pt transferred into WC with RW and CGA and sat at sink and washed face and combed hair with set up assist. Pt then stood with RW and light mod A and ambulated 144ft x 2 trials with RW and CGA to/from dayroom. Pt worked on blocked practice sit<>stands from elevated EOM on Airex x 5 reps with mod A overall - pt unable to perform without UE support and required cues to scoot to EOM and for anterior weight shifting when standing. Transitioned to alternating toe taps to 6in step 2x20  reps with mod HHA for balance. Returned to room and pt requested to remain sitting EOB. Concluded session with pt sitting EOB, needs within reach, and bed alarm on. Pt's sister present and NT present at bedside.   Treatment Session 2 Received pt semi-reclined in bed, pt agreeable to PT treatment, and denied any pain during session but reported fatigue. Session with emphasis on functional mobility/transfers, dynamic standing balance/coordination, NMR, stair navigation, and gait training. Pt transferred semi-reclined<>sitting EOB with HOB elevated and supervision. Donned shoes with max A and pt stood from EOB with RW and mod A and ambulated 125ft x 2 trials with RW and CGA to/from main therapy gym.  Pt stood x 2 trials on Airex from low sitting chair with table in front with light max A. Worked on problem solving, fine motor control, shoulder/wrist ROM, and sequencing re-creating picture on pegboard for 5 minutes x 1 and 3 minutes x 1 with mod cues overall, but had to complete activity in sitting (x additional 4 minutes) due to fatigue. Pt demonstrating poor spatial awareness and required cues to leave space in between pegs and in between rows. Pt ambulated ~31ft x 2 trials with RW and CGA to/from staircase and navigated 8 6in steps with bilateral rails and min A ascending with a step through and descending with a step to pattern. Pt then performed toe taps to 3 cones x 3 trials bilaterally with BUE support and CGA for balance - pt  with poor understanding of directions for sequencing returning to the middle in between reps. Pt required multiple rest breaks throughout session due to fatigue, then returned to room and pt ambulated in/out of bathroom with RW and CGA. Pt required assist for clothing management and continent of bowel and bladder but urinated on floor and on scrub pants. Removed soiled scrub pants in standing with mod A and donned clean underwear sitting EOB with supervision and stood with mod A  without AD to pull over hips. Pt returned to supine with supervision and concluded session with pt supine in bed, needs within reach, and bed alarm on.   Therapy Documentation Precautions:  Precautions Precautions: Fall Restrictions Weight Bearing Restrictions: Yes RUE Weight Bearing: Weight bearing as tolerated RUE Partial Weight Bearing Percentage or Pounds: RUE now WBAT LUE Weight Bearing: Weight bearing as tolerated Other Position/Activity Restrictions: No ROM restrictions  Agitated Behavior Scale: TBI Observation Details Observation Environment: CIR Start of observation period - Date: 03/20/22 Start of observation period - Time: 0730 End of observation period - Date: 03/20/22 End of observation period - Time: 0830 Agitated Behavior Scale (DO NOT LEAVE BLANKS) Short attention span, easy distractibility, inability to concentrate: Present to a slight degree Impulsive, impatient, low tolerance for pain or frustration: Present to a slight degree Uncooperative, resistant to care, demanding: Absent Violent and/or threatening violence toward people or property: Absent Explosive and/or unpredictable anger: Absent Rocking, rubbing, moaning, or other self-stimulating behavior: Absent Pulling at tubes, restraints, etc.: Absent Wandering from treatment areas: Absent Restlessness, pacing, excessive movement: Absent Repetitive behaviors, motor, and/or verbal: Absent Rapid, loud, or excessive talking: Absent Sudden changes of mood: Absent Easily initiated or excessive crying and/or laughter: Absent Self-abusiveness, physical and/or verbal: Absent Agitated behavior scale total score: 16  Therapy/Group: Individual Therapy Alfonse Alpers PT, DPT  03/20/2022, 6:52 AM

## 2022-03-20 NOTE — Plan of Care (Signed)
  Problem: RH Problem Solving Goal: LTG Patient will demonstrate problem solving for (SLP) Description: LTG:  Patient will demonstrate problem solving for basic/complex daily situations with cues  (SLP) Flowsheets (Taken 03/20/2022 1526) LTG: Patient will demonstrate problem solving for (SLP): Basic daily situations LTG Patient will demonstrate problem solving for: Supervision   Problem: RH Memory Goal: LTG Patient will use memory compensatory aids to (SLP) Description: LTG:  Patient will use memory compensatory aids to recall biographical/new, daily complex information with cues (SLP) Flowsheets (Taken 03/20/2022 1526) LTG: Patient will use memory compensatory aids to (SLP): Supervision   Problem: RH Awareness Goal: LTG: Patient will demonstrate awareness during functional activites type of (SLP) Description: LTG: Patient will demonstrate awareness during functional activites type of (SLP) Flowsheets (Taken 03/20/2022 1526) Patient will demonstrate during cognitive/linguistic activities awareness type of: Anticipatory LTG: Patient will demonstrate awareness during cognitive/linguistic activities with assistance of (SLP): Supervision
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
## 2022-03-21 MED ORDER — HYDROCERIN EX CREA
TOPICAL_CREAM | Freq: Two times a day (BID) | CUTANEOUS | Status: DC
  Filled 2022-03-21 (×2): qty 113

## 2022-03-21 NOTE — Progress Notes (Signed)
Occupational Therapy TBI Note  Patient Details  Name: Bill Brooks. MRN: [id number] Date of Birth: [birth year]  Today's Date: 03/21/2022 OT Individual Time: 1331-1441 OT Individual Time Calculation (min): 70 min    Short Term Goals: Week 1:  OT Short Term Goal 1 (Week 1): Patient will be given built up handles to increase independence with self-feeding and grooming tasks. OT Short Term Goal 2 (Week 1): Patient will tolerate standing at the sink for 2 minutes in preparation for BADL tasks. OT Short Term Goal 3 (Week 1): Patient will demonstrate improved grip strength by opening deodorant lid.  Skilled Therapeutic Interventions/Progress Updates:    Patient received supine in bed.  Agreeable to therapy.  Declined need to void.  Needing assistance to don shoes due to decreased wrist/ hand strength and control.   Patient walked with RW and CGA to supervision to main gym.  Worked in inclined supine with moist heat on B shoulders to address passive to active range of motion in BUE.  Patient with orthopedic and neurologic restrictions to range of motion. Patient has difficulty with terminal elbow extension - but with cueing after stretching, able to increase elbow extension actively.  Same with wrist extension - actively working flexors - but when given a target, able to isolate wrist extension. Worked to increase shoulder range of motion.  Initially arm on body, then gentle body on arm movement.  Patient with bilateral shoulder range restriction R>L.   Seated with Ue's supported on bedside table at chest height.  Worked on flexing/ extending trunk to address shoulder range.  Also lateral weight shift with Ue's supported to encourage horizontal abd/adduction.  Patient very tolerant of stretching.  Followed cueing and facilitation well.   Patient ambulated back to room, and back to bed.  Patient able to remove shoes and get into bed without assistance.  Bed alarm engaged and call bell and bedside table in  reach.    Therapy Documentation Precautions:  Precautions Precautions: Fall Restrictions Weight Bearing Restrictions: Yes RUE Weight Bearing: Weight bearing as tolerated RUE Partial Weight Bearing Percentage or Pounds: RUE now WBAT LUE Weight Bearing: Weight bearing as tolerated Other Position/Activity Restrictions: No ROM restrictions  Pain:  Unable to rate pain.  Indicates mild ache in shoulders.   Agitated Behavior Scale: TBI Observation Details Observation Environment: CIR Start of observation period - Date: 03/21/22 Start of observation period - Time: 1330 End of observation period - Date: 03/21/22 End of observation period - Time: 1440 Agitated Behavior Scale (DO NOT LEAVE BLANKS) Short attention span, easy distractibility, inability to concentrate: Present to a slight degree Impulsive, impatient, low tolerance for pain or frustration: Absent Uncooperative, resistant to care, demanding: Absent Violent and/or threatening violence toward people or property: Absent Explosive and/or unpredictable anger: Absent Rocking, rubbing, moaning, or other self-stimulating behavior: Absent Pulling at tubes, restraints, etc.: Absent Wandering from treatment areas: Absent Restlessness, pacing, excessive movement: Absent Repetitive behaviors, motor, and/or verbal: Absent Rapid, loud, or excessive talking: Absent Sudden changes of mood: Absent Easily initiated or excessive crying and/or laughter: Absent Self-abusiveness, physical and/or verbal: Absent Agitated behavior scale total score: 15    Therapy/Group: Individual Therapy  Mariah Milling 03/21/2022, 2:54 PM

## 2022-03-21 NOTE — Patient Care Conference (Addendum)
Inpatient RehabilitationTeam Conference and Plan of Care Update Date: 03/21/2022   Time: 11:44 AM    Patient Name: Bill Brooks.      Medical Record Number: [id number]  Date of Birth: [birth year] Sex: Male         Room/Bed: 4W11C/4W11C-01 Payor Info: Payor: /    Admit Date/Time:  03/16/2022  4:27 PM  Primary Diagnosis:  TBI (traumatic brain injury) [facility])  Hospital Problems: Principal Problem:   TBI (traumatic brain injury) [facility])    Expected Discharge Date: Expected Discharge Date: 03/28/22  Team Members Present: Physician leading conference: Dr. Leeroy Cha Social Worker Present: Erlene Quan, BSW Nurse Present: Dorien Chihuahua, RN PT Present: [name], PT OT Present: Jennefer Bravo, OT SLP Present: Weston Anna, SLP     Current Status/Progress Goal Weekly Team Focus  Bowel/Bladder      Continent of bowel and bladder    continent    toileting  Swallow/Nutrition/ Hydration               ADL's   Mod A UB, Max A LB, Max A toileting   Supervision   BUE ROM/strengthening, ADL retraining, STM recall, general strengthening    Mobility   bed mobility min A, transfers with RW min/mod A, gait 16ft with RW CGA/min A   supervision overall  functional mobility/transfers, (safety) awareness, generalized strengthening and endurance, dynamic standing balance/coordination, gait training, D/C planning, and DME    Communication                Safety/Cognition/ Behavioral Observations  Min-Mod A   Supervision   functional problem solving, recall with use of strategies, emergent-anticipatory awareness    Pain      Back pain; using a k-pad and scheduled tylenol    Pain managed < 4 with prns    Assess pain management ; need for and effectiveness of prn medications  Skin     Skin tear left foot; tegaderm in place    Skin tear healing; no new skin issues    Assess skin q shift and complete dressing changes to skin tear per protocol    Discharge  Planning:  Discharging home with sister and family to assist (sib, aunts, cousins, GF). No steps to enter the home.   Team Discussion: Patient with lowe back pain and hypotension post polytrauma/TBI. Ranchos level VII with STMD; easily frustrated and demonstrating poor safety awareness.  Patient on target to meet rehab goals: yes, currently needs min assist for bathing and dressing.   *See Care Plan and progress notes for long and short-term goals.   Revisions to Treatment Plan:  Behavioral Management Plan activation   Teaching Needs: Safety, medications, transfers, toileting, etc.   Current Barriers to Discharge: Decreased caregiver support  Possible Resolutions to Barriers: Family education HH follow up services DME: RW     Medical Summary Current Status: hypotension, anxiety, hyperglycemia, contractures in shoulders, low back pain, history of alcohol use  Barriers to Discharge: Medical stability  Barriers to Discharge Comments: hypotension, anxiety, hyperglycemia, contractures in shoulders, low back pain, history of alcohol use Possible Resolutions to [employer] Focus: d/c klonopin, monitor blood pressure TID, add magnesium supplement, continue kpad, scheduled tylenol, continue folic acid, continue multivitamin   Continued Need for Acute Rehabilitation Level of Care: The patient requires daily medical management by a physician with specialized training in physical medicine and rehabilitation for the following reasons: Direction of a multidisciplinary physical rehabilitation program to maximize functional independence : Yes Medical management  of patient stability for increased activity during participation in an intensive rehabilitation regime.: Yes Analysis of laboratory values and/or radiology reports with any subsequent need for medication adjustment and/or medical intervention. : Yes   I attest that I was present, lead the team conference, and concur with the  assessment and plan of the team.   Dorien Chihuahua B 03/21/2022, 1:57 PM

## 2022-03-21 NOTE — Progress Notes (Addendum)
Speech Language Pathology Daily Session Note  Patient Details  Name: Bill Brooks. MRN: [id number] Date of Birth: [birth year]  Today's Date: 03/21/2022 SLP Individual Time: 7616-0737 SLP Individual Time Calculation (min): 55 min  Short Term Goals: Week 1: SLP Short Term Goal 1 (Week 1): Patient will demonstrate functional problem solving for mildly complex tasks with Min verbal and visual cues. SLP Short Term Goal 2 (Week 1): Patient will recall new, daily information with use of memory compensatory strategies with Min verbal and visual cues. SLP Short Term Goal 3 (Week 1): Patient will demonstrate emergent awareness of errors during functional and familiar tasks with Min verbal cues.  Skilled Therapeutic Interventions: Pt seen this date for skilled ST intervention targeting cognitive goals outlined above. Pt received awake/alert and OOB in recliner chair; receiving AM medications from RN. Agreeable to intervention in hospital room.  Today's session with emphasis on functional cognitive skill training r/t IADL tasks. Pt sequenced pictures of ADL related tasks of 4-steps with ~90% accuracy given overall Min A verbal cues. Oriented to date (month, year, DOW, and date) given Min-Mod A verbal and visual cues/external aids to include calendar; without external aids and verbal cues, pt unable to demonstrate orientation to date. Initiated meal log to assist with behavior management given pt has been reported to become agitated when unable to recall food consumed. Recalled to orientation information given Sup A following a delay with distractions. Min-Mod A verbal cues to recall information re: meal log, calendar, and ST tasks - benefited from errorless learning and spaced retrieval techniques. No overt s/sx concerning for aspiration nor oral deficits when consuming AM meal of thin liquids and regular textures.   Pt left in room and OOB in recliner chair with all safety measures activated and call bell  within reach. Signage to "call, don't fall" and call bell reviewed an reinforced. Continue per current ST POC.  Pain No pain reported; NAD  Therapy/Group: Individual Therapy  Jamey Harman A Goldman Birchall 03/21/2022, 3:12 PM

## 2022-03-21 NOTE — Progress Notes (Signed)
Physical Therapy TBI Note  Patient Details  Name: Bill Brooks. MRN: [id number] Date of Birth: [birth year]  Today's Date: 03/21/2022 PT Individual Time: 0805-0917 PT Individual Time Calculation (min): 72 min   Short Term Goals: Week 1:  PT Short Term Goal 1 (Week 1): Pt will transfer sup to sit w/ CGA PT Short Term Goal 2 (Week 1): Pt will transfer sit to stand w/ min A from low heights. PT Short Term Goal 3 (Week 1): Pt will amb w/ RW and CGA x 75'  Skilled Therapeutic Interventions/Progress Updates:    Pt received supine in bed asleep and upon awakening becomes upset that he has not been given his breakfast prior to therapy session. Therapist notified him that it was sitting in the recliner next to him, pt unaware of this. Therapist notified NT to ensure pt is woken up in AM and set-up to eat breakfast prior to time of therapy sessions starting and therapist placed a sign above pt's HOB. Pt agreeable to participate in therapy at this time despite this.   Reports his R wrist has been "bothering" him all night - notified nurse for medication administration.  Supine>sitting L EOB, pt initially reaching out for therapist to assist him so cued pt on logroll technique to increase independence and performed with CGA and HOB flat not using bedrails.  Sitting EOB threaded on pants with min assist, then donned shoes total assist for time management. Sit>stand elevated EOB>RW with min assist for lifting to stand when pt then noted to be incontinent of urine - asked pt and he was unaware of this. Standing with intermittent use of UE support on RW and CGA/intermittent light min assist for balance due to posterior lean while pt performed peri-care with set-up assist. Donned clean brief dependently and pt pulled pants up over hips mod assist for time management.  Pt requesting to eat his cereal quickly so therapist set this up for pt to complete while therapist set-up environment to start session and again  asked nurse for medications.  Donned clean scrub shirt EOB with min assist to get it over his head.  Sit>stand elevated EOB>RW with close supervision. Gait training ~117ft to main therapy gym using RW with CGA assist for safety. Pt demonstrating the following gait deviations with therapist providing the described cuing and facilitation for improvement:  - decreased gait speed - forward trunk flexed posture with downward gaze - overall slight crouched posture with hips/knees flexed minimally - reports R knee pain that just onset while starting to walk - cuing throughout for improved upright posture, forward gaze, and improved step length  Pt slightly impulsive during session wanting to come to standing before environment set-up.  Sitting EOM, performed R knee AROM x20 reps and upon standing back up pt reports resolution of symptoms.  Sit>stand from lower/normal height EOM with cuing and demonstration on increased anterior trunk lean to improve ability to rise to stand with CGA/light min assist - continues to be able to stand with this much assist from the mat throughout session.  Gait training 31ft + 53ft using R UE support over therapist's shoulders transitioned to no UE support with min assist for balance. Pt demonstrating the following gait deviations with therapist providing the described cuing and facilitation for improvement:  - forward trunk flexed posture with anterior leaning/LOB that worsens with fatigue - narrow BOS with slight scissoring - R/L postural sway/instability that worsens towards end when fatigued - decreased gait speed  Dynamic balance challenge  while targeting B LE strengthening via repeated step up/down on/off a 4" step, no UE support, x5 reps + x8 reps each LE with heavy min assist for balance - difficult stepping backwards off with R LE (but this is pt's preferred foot to lead with when stepping off) and pt feeling less confident stepping up leading with R LE -  repeated L posterior LOB requiring assist to correct - pt noted to have more difficulty lifting R LE on/off the step with possible hip flexor weakness  Pt reports need to use bathroom. Gait training to public bathroom using RW with CGA as described above. Therapist provided total assist for LB clothing management due to urgency - continent of bladder. Performed standing hand hygiene at sink with pt continuing to have persistent crouched/flexed posture and poor AD management at sink - CGA for safety.  Gait training final distance back to his room with CGA using RW.   Pt left seated in recliner with needs in reach, seat belt alarm on, B LEs elevated, and NT aware of pt's position.  Therapy Documentation Precautions:  Precautions Precautions: Fall Restrictions Weight Bearing Restrictions: Yes RUE Weight Bearing: Weight bearing as tolerated RUE Partial Weight Bearing Percentage or Pounds: RUE now WBAT LUE Weight Bearing: Weight bearing as tolerated Other Position/Activity Restrictions: No ROM restrictions   Pain:   R wrist (overnight) and R knee pain (temporary) - details above - nurse notified for medication administration - modified interventions for pain management.  Agitated Behavior Scale: TBI Observation Details Observation Environment: CIR Start of observation period - Date: 03/21/22 Start of observation period - Time: 0805 End of observation period - Date: 03/21/22 End of observation period - Time: 0917  Agitated Behavior Scale (DO NOT LEAVE BLANKS) Short attention span, easy distractibility, inability to concentrate: Present to a slight degree Impulsive, impatient, low tolerance for pain or frustration: Present to a slight degree Uncooperative, resistant to care, demanding: Absent Violent and/or threatening violence toward people or property: Absent Explosive and/or unpredictable anger: Absent Rocking, rubbing, moaning, or other self-stimulating behavior: Absent Pulling at  tubes, restraints, etc.: Absent Wandering from treatment areas: Absent Restlessness, pacing, excessive movement: Absent Repetitive behaviors, motor, and/or verbal: Absent Rapid, loud, or excessive talking: Absent Sudden changes of mood: Absent Easily initiated or excessive crying and/or laughter: Absent Self-abusiveness, physical and/or verbal: Absent Agitated behavior scale total score: 16   Therapy/Group: Individual Therapy  Tawana Scale , PT, DPT, NCS, CSRS 03/21/2022, 7:50 AM

## 2022-03-21 NOTE — Plan of Care (Signed)
Behavioral Plan   Rancho Level: 7  Behavior to decrease/eliminate:  -intermittent frustration   Interventions: -visual aid centered around meal times -bed alarm/seatbelt alarm -k-pad -schedule for pain medication  Recommendations for interactions with patient: -speaking calmly and providing reassurance   Attendees:  Delanna Notice [facility] Nida

## 2022-03-21 NOTE — Progress Notes (Signed)
PROGRESS NOTE   Subjective/Complaints: No new complaints this morning Feels the heating pad helped a lot with his back pain Hypotensive, klonopin d/ced  ROS: +low back pain- improved   Objective:   No results found. Recent Labs    03/19/22 0619 03/20/22 0551  WBC 9.9  --   HGB 9.3* 9.6*  HCT 29.8* 29.6*  PLT 219  --    Recent Labs    03/19/22 0619  NA 136  K 3.8  CL 105  CO2 23  GLUCOSE 104*  BUN 23  CREATININE 0.95  CALCIUM 9.0    Intake/Output Summary (Last 24 hours) at 03/21/2022 1109 Last data filed at 03/21/2022 0910 Gross per 24 hour  Intake 1165 ml  Output 350 ml  Net 815 ml        Physical Exam: Vital Signs Blood pressure 98/88, pulse 93, temperature 98 F (36.7 C), resp. rate 18, height 6\' 1"  (1.854 m), weight 72.2 kg, SpO2 100 %.   General: No acute distress, BMI 21 Mood and affect are appropriate Heart: Regular rate and rhythm no rubs murmurs or extra sounds Lungs: Clear to auscultation, breathing unlabored, no rales or wheezes Abdomen: Positive bowel sounds, soft nontender to palpation, nondistended Extremities: No clubbing, cyanosis. Mild nonpitting localized swelling of bilateral wrists. ROM reduced in bilateral wrist extension. No TTP.  Skin: No evidence of breakdown, no evidence of rash Neurologic: Cranial nerves II through XII intact,  BL UE intention tremor on FTN testing. Reslexes 2+ BL UE.  Psych: intermittent frustration Musculoskeletal: motor strength is 5/5 in bilateral UE and Les throughout.   Assessment/Plan: 1. Functional deficits which require 3+ hours per day of interdisciplinary therapy in a comprehensive inpatient rehab setting. Physiatrist is providing close team supervision and 24 hour management of active medical problems listed below. Physiatrist and rehab team continue to assess barriers to discharge/monitor patient progress toward functional and medical  goals  Care Tool:  Bathing    Body parts bathed by patient: Chest, Abdomen, Front perineal area, Right upper leg, Left upper leg, Face   Body parts bathed by helper: Left lower leg, Right lower leg, Buttocks, Left arm, Right arm     Bathing assist Assist Level: Maximal Assistance - Patient 24 - 49%     Upper Body Dressing/Undressing Upper body dressing   What is the patient wearing?: Pull over shirt    Upper body assist Assist Level: Maximal Assistance - Patient 25 - 49%    Lower Body Dressing/Undressing Lower body dressing      What is the patient wearing?: Pants     Lower body assist Assist for lower body dressing: Maximal Assistance - Patient 25 - 49%     Toileting Toileting    Toileting assist Assist for toileting: Moderate Assistance - Patient 50 - 74%     Transfers Chair/bed transfer  Transfers assist     Chair/bed transfer assist level: Minimal Assistance - Patient > 75%     Locomotion Ambulation   Ambulation assist      Assist level: Contact Guard/Touching assist Assistive device: Walker-rolling Max distance: 132ft   Walk 10 feet activity   Assist  Assist level: Contact Guard/Touching assist Assistive device: Walker-rolling   Walk 50 feet activity   Assist Walk 50 feet with 2 turns activity did not occur: Safety/medical concerns  Assist level: Contact Guard/Touching assist Assistive device: Walker-rolling    Walk 150 feet activity   Assist Walk 150 feet activity did not occur: Safety/medical concerns  Assist level: Minimal Assistance - Patient > 75% Assistive device: Walker-rolling    Walk 10 feet on uneven surface  activity   Assist     Assist level: Minimal Assistance - Patient > 75% (ramp) Assistive device: Walker-rolling   Wheelchair     Assist Is the patient using a wheelchair?: Yes Type of Wheelchair: Manual    Wheelchair assist level: Contact Guard/Touching assist Max wheelchair distance: 20     Wheelchair 50 feet with 2 turns activity    Assist        Assist Level: Maximal Assistance - Patient 25 - 49%   Wheelchair 150 feet activity     Assist      Assist Level: Total Assistance - Patient < 25%   Blood pressure 98/88, pulse 93, temperature 98 F (36.7 C), resp. rate 18, height 6\' 1"  (1.854 m), weight 72.2 kg, SpO2 100 %.  Medical Problem List and Plan: 1. 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  Antithrombotics: -DVT/anticoagulation:  Pharmaceutical: Lovenox             -antiplatelet therapy: N/A 3. Pain Management: Will schedule tylenol qid --continue advil prn.  - 1/15: Voltaren gel QID to bilateral wrists for pain  4. Mood/Behavior/Sleep: LCSW to follow for evaluation and support.             --Continue Seroquel at nights for sleep/agitation. Sleep wake chart.              -antipsychotic agents:  Seroquel. 5. Neuropsych/cognition: This patient is capable of making decisions on his own behalf. 6. Skin/Wound Care: Routine pressure relief measures.              --continue protein supplements.  7. Fluids/Electrolytes/Nutrition: Monitor I/O. Check CMET in am 8. Acute on chronic SDH:  has resolved and has been seizure free.             --agitation/delirium resolved? Continue Klonopin 0.5 mg TID (decreased ib 01/08) ---Continue Seroquel 200 mg/HS (decreased 01/080             --Depakote increased to 500 mg TID on 12/28. Ammonia level slightly elevated- 37 on 12/27. Recheck 01/15 9. Tachycardia: Monitor HR/BP TID- continue lopressor.  10. VDRF s/p trach/rib Fx, R-PTX: Respiratory status stable.  11.  Urinary retention: Monitor voiding. Continue Flomax for now. 12.  ABLA: Discussed improvement  in Hgb 1/16.   13. H/o polysubstance abuse including THC, cocaine, and EOTH: Continue Thiamine daily              --Vitamin B1, Vitamin B12 and Vitamin D levels WNL.  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 Hyperglycemia: Hgb A1c-5.6 and off tube feeds.  Will discontinue CBG checks. Start magnesium gluconate 250mg  HS 15. Low back pain: kpad ordered. Improved 16. Hypotension: d/c klonpin    LOS: 5 days A FACE TO FACE EVALUATION WAS PERFORMED  Tynleigh Birt P Aella Ronda 03/21/2022, 11:09 AM

## 2022-03-21 NOTE — Progress Notes (Signed)
Patient ID: Bill Wert., male   DOB: 10-03-1960, 62 y.o.   MRN: [id number]  Team Conference Report to Patient/Family  Team Conference discussion was reviewed with the patient and caregiver, including goals, any changes in plan of care and target discharge date.  Patient and caregiver express understanding and are in agreement.  The patient has a target discharge date of 03/28/22.  SW met with patient and called patient sister, Bill Brooks at bedside. SW provided team conference updates to both. Sister visited patient and feels as he is making great progress. Sister dropped of clean clothing last night for pt. Sister currently headed to have a procedure and will have pt's niece follow up this evening. Sister will attend family education on 1/22 9-12. No additional questions or concerns.   Dyanne Iha 03/21/2022, 2:00 PM

## 2022-03-21 NOTE — Progress Notes (Signed)
Patient ID: Bill Brooks., male   DOB: March 18, 1960, 62 y.o.   MRN: [id number]  [name] ordered with through [facility] with [facility].

## 2022-03-22 MED ORDER — METHOCARBAMOL 750 MG PO TABS
750.0000 mg | ORAL_TABLET | Freq: Three times a day (TID) | ORAL | Status: DC | PRN
  Administered 2022-03-22 – 2022-03-24 (×3): 750 mg via ORAL
  Filled 2022-03-22 (×3): qty 1

## 2022-03-22 NOTE — Progress Notes (Signed)
Physical Therapy TBI Note  Patient Details  Name: Bill Brooks. MRN: [id number] Date of Birth: May 02, 1960  Today's Date: 03/22/2022 PT Individual Time: 1100-1156 and 1415-1455 PT Individual Time Calculation (min): 56 min and 40 min  Short Term Goals: Week 1:  PT Short Term Goal 1 (Week 1): Pt will transfer sup to sit w/ CGA PT Short Term Goal 2 (Week 1): Pt will transfer sit to stand w/ min A from low heights. PT Short Term Goal 3 (Week 1): Pt will amb w/ RW and CGA x 75'  Skilled Therapeutic Interventions/Progress Updates:   Treatment Session 1 Received pt supine in bed asleep. Upon wakening pt agreeable to PT treatment and reported 10/10 pain in L wrist (premedicated) - repositioning, rest breaks, and distraction done to reduce pain levels. Session with emphasis on functional mobility/transfers, generalized strengthening and endurance, dynamic standing balance/coordination, and gait training. Pt transferred supine<>sitting EOB from flat bed with close supervision and cues for logroll technique. Stood from EOB with RW and mod A and ambulated 173ft x 2 trials with RW and CGA to/from dayroom - c/o increased bilateral knee pain stating it is "weather related".   Pt then performed LAQ 2x15 bilaterally with 1.5lb ankle weight and hip flexion 2x15 bilaterally with 1.5lb ankle weight while resting. Stood without AD and light mod A fading to min A on Airex from elevated EOM and worked on dynamic standing balance and functional use of LUE playing cornhole x 3 trials with min A for balance. Pt required rest breaks in between sets due to fatigue and pain. Stood again on Airex with min A and worked on problem solving, fine motor control, and spatial Actor picture with legos - pt only able to remain standing for 2 minutes before having to sit and complete remainder of activity (additional 4 minutes); required min cues overall. Pt then reported urge to have BM and returned to room. Pt continent  of bowel and bladder and performed hygiene management without assist. Stood from commode with RW and CGA and stood at sink to perform hand hygiene, then requested to return to bed. Pt transferred sit<>supine with supervision and concluded session with pt supine in bed, needs within reach, and bed alarm on. Assisted pt with calling his sister.   Treatment Session 2 Received pt semi-reclined in bed asleep. Upon wakening, pt agreeable to PT treatment, and reported 9/10 pain in bilateral wrists and ankles - notified RN but RN reported pt up to date on all pain medications. Session with emphasis on functional mobility/transfers, generalized strengthening and endurance, dynamic standing balance/coordination, simulated car transfers, and gait training. Pt transferred supine<>sitting EOB from flat bed with supervision and increased time. Stood from EOB with RW and min A and ambulated 336ft with RW and CGA to ortho gym - cues to decrease reliance on BUE support on RW. Stood from elevated EOM with RW and close supervision and performed ambulatory simulated car transfer with RW and min A and ambulated 82ft on uneven surfaces (ramp) with RW and CGA. Pt ambulated 51ft x 2 trials without AD and CGA to/from Nustep and performed BUE/LE strengthening on Nustep at workload 4 for 8 minutes for a total of 412 steps with emphasis on cardiovascular endurance. Pt limited by fatigue, back, and knee pain during session. Pt ambulated additional 35ft with RW and CGA back to room and requested to return to bed. Doffed shoes independently and transferred sit<>supine with supervision. Concluded session with pt supine in bed, needs within  reach, and bed alarm on. K-pad placed on low back for pain relief.   Therapy Documentation Precautions:  Precautions Precautions: Fall Restrictions Weight Bearing Restrictions: Yes RUE Weight Bearing: Weight bearing as tolerated RUE Partial Weight Bearing Percentage or Pounds: RUE now WBAT LUE  Weight Bearing: Weight bearing as tolerated Other Position/Activity Restrictions: No ROM restrictions  Agitated Behavior Scale: TBI Observation Details Observation Environment: CIR Start of observation period - Date: 03/22/22 Start of observation period - Time: 1100 End of observation period - Date: 03/22/22 End of observation period - Time: 1156 Agitated Behavior Scale (DO NOT LEAVE BLANKS) Short attention span, easy distractibility, inability to concentrate: Present to a slight degree Impulsive, impatient, low tolerance for pain or frustration: Absent Uncooperative, resistant to care, demanding: Absent Violent and/or threatening violence toward people or property: Absent Explosive and/or unpredictable anger: Absent Rocking, rubbing, moaning, or other self-stimulating behavior: Absent Pulling at tubes, restraints, etc.: Absent Wandering from treatment areas: Absent Restlessness, pacing, excessive movement: Absent Repetitive behaviors, motor, and/or verbal: Absent Rapid, loud, or excessive talking: Absent Sudden changes of mood: Absent Easily initiated or excessive crying and/or laughter: Absent Self-abusiveness, physical and/or verbal: Absent Agitated behavior scale total score: 15  Therapy/Group: Individual Therapy Alfonse Alpers PT, DPT  03/22/2022, 12:04 PM

## 2022-03-22 NOTE — Progress Notes (Signed)
Occupational Therapy TBI Note  Patient Details  Name: Bill Brooks. MRN: [id number] Date of Birth: 1960/12/25  Today's Date: 03/22/2022 OT Individual Time: 0800-0900 OT Individual Time Calculation (min): 60 min    Short Term Goals: Week 1:  OT Short Term Goal 1 (Week 1): Patient will be given built up handles to increase independence with self-feeding and grooming tasks. OT Short Term Goal 2 (Week 1): Patient will tolerate standing at the sink for 2 minutes in preparation for BADL tasks. OT Short Term Goal 3 (Week 1): Patient will demonstrate improved grip strength by opening deodorant lid.  Skilled Therapeutic Interventions/Progress Updates:  Skilled OT intervention completed with focus on d/c planning, DME education, self-feeding. Pt received seated EOB with NT present assisting pt with setting up his meal. Pt has a fixation with his meals and part of his behavior plan in relation to his TBI is to allow meal time and to produce a visual aid for when/what he completed each time a meal occurs as pt will Brooks in as little as 30 mins that he already ate. Un-rated pain in bilateral wrists and shoulders. Nursing notified and OT offered rest breaks and repositioning/heat for pain reduction throughout.   Start of session with emphasis on supervising pt with meal consumption with supervision to alleviate agitation and promote a successful day with all therapies. Pt consumed eggs, toast, bacon and grits, with min A needed for opening small containers and packets due to impaired fine motor coordination though pt attempting to use his teeth as a modification for opening. Good wrist stability and extension with pouring liquids into various cups. Does have mild bilateral tremor that pt reports has been since his accident. He was able to verbalize the contents of his breakfast for OT to fill out his visual food log. Would benefit from seeing if pt can recall this in later therapy to determine if visual log  has helped with his STM recall.   Discussed during meal consumption, pt's sister's home set up with OT unsure about pt's accuracy. Discussed night time toileting suggestion with BSC to eliminate falls when pt is lethargic, as well as a TTB for showers if family doesn't already have one. CSW notified and plan to confirm during family ed however pt is uninsured so may need to prioritize.   Education provided on long handled shoe horn for doffing socks, sock aid for donning socks and shoe horn for shoes with overall mod A needed due to impaired sequencing and problem solving and anticipate pt will not recall steps and use but might be able to recall shoe horn for home use. Mod A donning of pullover shirt and zip up jacket due to bilateral shoulder ROM restrictions and wrist pain, with education provided on modification for donning.   CGA sit > stand using RW from elevated bed, then CGA to supervision during ambulation in hallway for about 100 ft including 1 full turn. Able to locate room with supervision. Pt requested to return to bed, with pt able to transition to semi-supine in bed with supervision. OT placed hot packs on bilateral wrists for pain management with tape to secure pack to the wrists with sleeve pulled over to maintain heat.   Pt remained semi-supine, with bed alarm on/activated, and with all needs in reach at end of session.  Therapy Documentation Precautions:  Precautions Precautions: Fall Restrictions Weight Bearing Restrictions: Yes RUE Weight Bearing: Weight bearing as tolerated RUE Partial Weight Bearing Percentage or Pounds: RUE now  WBAT LUE Weight Bearing: Weight bearing as tolerated Other Position/Activity Restrictions: No ROM restrictions  Agitated Behavior Scale: TBI Observation Details Observation Environment: CIR Start of observation period - Date: 03/22/22 Start of observation period - Time: 0800 End of observation period - Date: 03/22/22 End of observation  period - Time: 0900 Agitated Behavior Scale (DO NOT LEAVE BLANKS) Short attention span, easy distractibility, inability to concentrate: Present to a slight degree Impulsive, impatient, low tolerance for pain or frustration: Present to a slight degree Uncooperative, resistant to care, demanding: Absent Violent and/or threatening violence toward people or property: Absent Explosive and/or unpredictable anger: Absent Rocking, rubbing, moaning, or other self-stimulating behavior: Absent Pulling at tubes, restraints, etc.: Absent Wandering from treatment areas: Absent Restlessness, pacing, excessive movement: Absent Repetitive behaviors, motor, and/or verbal: Absent Rapid, loud, or excessive talking: Absent Sudden changes of mood: Absent Easily initiated or excessive crying and/or laughter: Absent Self-abusiveness, physical and/or verbal: Absent Agitated behavior scale total score: 16   Therapy/Group: Individual Therapy  Blase Mess, MS, OTR/L  03/22/2022, 12:20 PM

## 2022-03-22 NOTE — Progress Notes (Signed)
PROGRESS NOTE   Subjective/Complaints: Hypotensive: decrease robaxin to 750mg  TID Rolling walker ordered Walking with CG  ROS: +low back pain- improved, +anxiety   Objective:   No results found. Recent Labs    03/20/22 0551  HGB 9.6*  HCT 29.6*   No results for input(s): "NA", "K", "CL", "CO2", "GLUCOSE", "BUN", "CREATININE", "CALCIUM" in the last 72 hours.   Intake/Output Summary (Last 24 hours) at 03/22/2022 1039 Last data filed at 03/22/2022 0905 Gross per 24 hour  Intake 1309 ml  Output --  Net 1309 ml        Physical Exam: Vital Signs Blood pressure 99/74, pulse 79, temperature 98.2 F (36.8 C), temperature source Oral, resp. rate 18, height 6\' 1"  (1.854 m), weight 72.2 kg, SpO2 99 %.   General: No acute distress, BMI 21 Mood and affect are appropriate Heart: Regular rate and rhythm no rubs murmurs or extra sounds Lungs: Clear to auscultation, breathing unlabored, no rales or wheezes Abdomen: Positive bowel sounds, soft nontender to palpation, nondistended Extremities: No clubbing, cyanosis. Mild nonpitting localized swelling of bilateral wrists. ROM reduced in bilateral wrist extension. No TTP.  Skin: No evidence of breakdown, no evidence of rash Neurologic: Cranial nerves II through XII intact,  BL UE intention tremor on FTN testing. Reslexes 2+ BL UE.  Psych: intermittent frustration Musculoskeletal: motor strength is 5/5 in bilateral UE and Les throughout. Ambulating RW with CG  Assessment/Plan: 1. Functional deficits which require 3+ hours per day of interdisciplinary therapy in a comprehensive inpatient rehab setting. Physiatrist is providing close team supervision and 24 hour management of active medical problems listed below. Physiatrist and rehab team continue to assess barriers to discharge/monitor patient progress toward functional and medical goals  Care Tool:  Bathing    Body parts  bathed by patient: Chest, Abdomen, Front perineal area, Right upper leg, Left upper leg, Face   Body parts bathed by helper: Left lower leg, Right lower leg, Buttocks, Left arm, Right arm     Bathing assist Assist Level: Maximal Assistance - Patient 24 - 49%     Upper Body Dressing/Undressing Upper body dressing   What is the patient wearing?: Pull over shirt    Upper body assist Assist Level: Minimal Assistance - Patient > 75%    Lower Body Dressing/Undressing Lower body dressing      What is the patient wearing?: Pants     Lower body assist Assist for lower body dressing: Maximal Assistance - Patient 25 - 49%     Toileting Toileting    Toileting assist Assist for toileting: Moderate Assistance - Patient 50 - 74%     Transfers Chair/bed transfer  Transfers assist     Chair/bed transfer assist level: Minimal Assistance - Patient > 75%     Locomotion Ambulation   Ambulation assist      Assist level: Contact Guard/Touching assist Assistive device: Walker-rolling Max distance: 160ft   Walk 10 feet activity   Assist     Assist level: Contact Guard/Touching assist Assistive device: Walker-rolling   Walk 50 feet activity   Assist Walk 50 feet with 2 turns activity did not occur: Safety/medical concerns  Assist level: Contact  Guard/Touching assist Assistive device: Walker-rolling    Walk 150 feet activity   Assist Walk 150 feet activity did not occur: Safety/medical concerns  Assist level: Minimal Assistance - Patient > 75% Assistive device: Walker-rolling    Walk 10 feet on uneven surface  activity   Assist     Assist level: Minimal Assistance - Patient > 75% (ramp) Assistive device: Walker-rolling   Wheelchair     Assist Is the patient using a wheelchair?: Yes Type of Wheelchair: Manual    Wheelchair assist level: Contact Guard/Touching assist Max wheelchair distance: 20    Wheelchair 50 feet with 2 turns  activity    Assist        Assist Level: Maximal Assistance - Patient 25 - 49%   Wheelchair 150 feet activity     Assist      Assist Level: Total Assistance - Patient < 25%   Blood pressure 99/74, pulse 79, temperature 98.2 F (36.8 C), temperature source Oral, resp. rate 18, height 6\' 1"  (1.854 m), weight 72.2 kg, SpO2 99 %.  Medical Problem List and Plan: 1. 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  Antithrombotics: -DVT/anticoagulation:  Pharmaceutical: Lovenox             -antiplatelet therapy: N/A 3. Pain Management: Will schedule tylenol qid --continue advil prn.  - 1/15: Voltaren gel QID to bilateral wrists for pain  4. Mood/Behavior/Sleep: LCSW to follow for evaluation and support.             --Continue Seroquel at nights for sleep/agitation. Sleep wake chart.              -antipsychotic agents:  Seroquel. 5. Neuropsych/cognition: This patient is capable of making decisions on his own behalf. 6. Skin/Wound Care: Routine pressure relief measures.              --continue protein supplements.  7. Fluids/Electrolytes/Nutrition: Monitor I/O. Check CMET in am 8. Acute on chronic SDH:  has resolved and has been seizure free.             --agitation/delirium resolved? Continue Klonopin 0.5 mg TID (decreased ib 01/08) ---Continue Seroquel 200 mg/HS (decreased 01/080             --Depakote increased to 500 mg TID on 12/28. Ammonia level slightly elevated- 37 on 12/27. Recheck 01/15 9. Tachycardia: Monitor HR/BP TID- continue lopressor.  10. VDRF s/p trach/rib Fx, R-PTX: Respiratory status stable.  11.  Urinary retention: d/c flomax given hypotension 12.  ABLA: Discussed improvement in Hgb 1/16.   13. H/o  polysubstance abuse including THC, cocaine, and EOTH: continue Thiamine daily              --Vitamin B1, Vitamin B12 and Vitamin D levels WNL.             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 Hyperglycemia: Hgb A1c-5.6 and off tube feeds.  Will discontinue CBG checks. Start magnesium gluconate 250mg  HS  15. Low back pain: kpad ordered. Improved 16. Hypotension: d/c klonpin, decrease robaxin to 750mg  q8H prn    LOS: 6 days A FACE TO FACE EVALUATION WAS PERFORMED  Clide Deutscher Ellia Knowlton 03/22/2022, 10:39 AM

## 2022-03-22 NOTE — Progress Notes (Addendum)
Speech Language Pathology TBI Note  Patient Details  Name: Bill Brooks. MRN: [id number] Date of Birth: 1960-06-28  Today's Date: 03/22/2022 SLP Individual Time: 0930-1015 SLP Individual Time Calculation (min): 45 min  Short Term Goals: Week 1: SLP Short Term Goal 1 (Week 1): Patient will demonstrate functional problem solving for mildly complex tasks with Min verbal and visual cues. SLP Short Term Goal 2 (Week 1): Patient will recall new, daily information with use of memory compensatory strategies with Min verbal and visual cues. SLP Short Term Goal 3 (Week 1): Patient will demonstrate emergent awareness of errors during functional and familiar tasks with Min verbal cues.  Skilled Therapeutic Interventions: Pt seen this date for skilled ST intervention targeting cognitive goals outlined in care plan. Pt received awake/alert and lying supine in bed; agreeable to ST intervention in speech office. Transferred from supine to sitting EOB with Min A and transferred sit<>stand from bed to w/c with Min A and verbal cues due to mild impulsivity. Agreeable to intervention in speech office. Reporting b/l wrist and back pain.  Session with emphasis on cognitive skill training within the context of functional IADL tasks. SLP provided education on current cognitive deficits in the setting of TBI, ST POC, and use of compensatory memory strategies to include use of environmental cues (e.g. whiteboard, ID badge's, etc), external/visual aids (e.g. calendar, meal log), and internal strategies (e.g. WRAP strategies) to facilitate recall of functional information. Provided education and coached pt re: use of writing and association strategies, specifically, by listing 4 of his current medicaitons and general purpose for each (e.g. miralax - bowel movements). Given mass practice and Mod-Max A multimodal cues to include spaced retrieval and errorless learning principles, pt able to state purpose for all 4 medications  100% of the time; only 50% without cues. With implementation of aforementioned techniques, SLP successfully faded cues from Mod-Max A multimodal cues to Min A verbal cues in order for pt to recall purpose of the targeted 4 medications when given medication name with 100% accuracy; 75% accuracy without cues. Pt oriented to date and meal consumed this AM given overall Sup A for referencing external aids (meal log and calendar). Pt attended to task in a quiet environment for up to 10 minute intervals with Sup A.   Pt returned to room and transferred back to bed, at pt's request, with all safety measures activated, call bell within reach, and all immediate needs met. Continue per current ST POC.  Pain Pain in b/l wrists and middle portion of the back; intermittent grimace when transferring out of bed - RN notified and provided medication per MD order.   Agitated Behavior Scale: TBI Observation Details Observation Environment: CIR Start of observation period - Date: 03/22/22 Start of observation period - Time: 0930 End of observation period - Date: 03/22/22 End of observation period - Time: 1015 Agitated Behavior Scale (DO NOT LEAVE BLANKS) Short attention span, easy distractibility, inability to concentrate: Present to a slight degree Impulsive, impatient, low tolerance for pain or frustration: Absent Uncooperative, resistant to care, demanding: Absent Violent and/or threatening violence toward people or property: Absent Explosive and/or unpredictable anger: Absent Rocking, rubbing, moaning, or other self-stimulating behavior: Absent Pulling at tubes, restraints, etc.: Absent Wandering from treatment areas: Absent Restlessness, pacing, excessive movement: Absent Repetitive behaviors, motor, and/or verbal: Absent Rapid, loud, or excessive talking: Absent Sudden changes of mood: Absent Easily initiated or excessive crying and/or laughter: Absent Self-abusiveness, physical and/or verbal:  Absent Agitated behavior scale total score:  15  Therapy/Group: Individual Therapy  Mikala Podoll A Tammra Pressman 03/22/2022, 12:41 PM

## 2022-03-22 NOTE — Plan of Care (Signed)
  Problem: Consults Goal: RH GENERAL PATIENT EDUCATION Description: See Patient Education module for education specifics. Outcome: Progressing   Problem: RH BOWEL ELIMINATION Goal: RH STG MANAGE BOWEL WITH ASSISTANCE Description: STG Manage Bowel with mod I Assistance. Outcome: Progressing Goal: RH STG MANAGE BOWEL W/MEDICATION W/ASSISTANCE Description: STG Manage Bowel with Medication with mod I Assistance. Outcome: Progressing   Problem: RH BLADDER ELIMINATION Goal: RH STG MANAGE BLADDER WITH ASSISTANCE Description: STG Manage Bladder With mod I Assistance Outcome: Progressing Goal: RH STG MANAGE BLADDER WITH MEDICATION WITH ASSISTANCE Description: STG Manage Bladder With Medication With mod I Assistance. Outcome: Progressing   

## 2022-03-23 MED ORDER — TAMSULOSIN HCL 0.4 MG PO CAPS
0.4000 mg | ORAL_CAPSULE | Freq: Every day | ORAL | Status: DC
  Administered 2022-03-23 – 2022-03-26 (×4): 0.4 mg via ORAL
  Filled 2022-03-23 (×4): qty 1

## 2022-03-23 MED ORDER — DIVALPROEX SODIUM 250 MG PO DR TAB
500.0000 mg | DELAYED_RELEASE_TABLET | Freq: Two times a day (BID) | ORAL | Status: DC
  Administered 2022-03-24 – 2022-03-26 (×5): 500 mg via ORAL
  Filled 2022-03-23 (×6): qty 2

## 2022-03-23 NOTE — Progress Notes (Addendum)
Speech Language Pathology TBI Note  Patient Details  Name: Bill Brooks. MRN: [id number] Date of Birth: 1960-04-11  Today's Date: 03/23/2022 SLP Individual Time: 0900-1000 SLP Individual Time Calculation (min): 60 min  Short Term Goals: Week 1: SLP Short Term Goal 1 (Week 1): Patient will demonstrate functional problem solving for mildly complex tasks with Min verbal and visual cues. SLP Short Term Goal 2 (Week 1): Patient will recall new, daily information with use of memory compensatory strategies with Min verbal and visual cues. SLP Short Term Goal 3 (Week 1): Patient will demonstrate emergent awareness of errors during functional and familiar tasks with Min verbal cues.  Skilled Therapeutic Interventions: Pt seen this date for skilled ST intervention targeting cognitive goals outlined in care plan. Pt received lying supine in bed, sleeping; aroused easily to name. Agreeable to intervention in speech office. Transferred from supine to sit and for sit to stand for pivoting to w/c with CGA. Interactive and engaged throughout.session. Continues to endorse pain though appeared more redirectable today vs last session.   Today's session with emphasis on cognitive skill training within functional IADL tasks (medication management and self-advocacy/agency). At onset of today's session, pt recalled the purpose of the four out of four medications from previous session with overall Sup to Min A verbal cues. Recalled items consumed during AM meal with 100% accuracy given set-up assistance (provided meal log for pt to reference). Oriented x 4 given Min A gesture cues for identification of date on calendar. SLP facilitated further recall of the remainder of pt's prescribed medications by providing a list of these medications by name, frequency, dosage, and purpose.   With use of medication list and Mod A faded to Min A verbal and visual cues, pt accurately placed 2 out of 2 medications (various frequencies)  in BID pill organizer. Additionally, benefited from [place] A verbal cues for providing increased details when answering verbal judgment questions re: safe medication administration. SLP provided education to pt re: who to reach out to with questions re: medications, keeping medications out of reach of children, and receiving assistance and supervision with medication management tasks upon d/c; pt verbalized understanding though will need ongoing reinforcement of education provided. Provided Mod-Max assistance for pt to generate a list of appropriate questions to ask his MD re: current medications and what he will need at home given pt has no insurance; MD and SW messaged as well.  For safety awareness during transfers, pt benefits from [place] A verbal cues to lock brakes and wait for assistance before standing from EOB.  Pt returned to room and transferred back to bed with all safety measures activated, call bell within reach, and immediate needs addressed. Due to ongoing cognitive impairment and fine motor deficits, recommend supervision and min assistance with medication management at time of d/c from CIR; pt aware and verbalized understanding. Continue per current ST POC.  Pain Continues to endorse back pain; NAD - offered distraction as pain management intervention  Agitated Behavior Scale: TBI Observation Details Observation Environment: CIR Start of observation period - Date: 03/23/22 Start of observation period - Time: 0900 End of observation period - Date: 03/23/22 End of observation period - Time: 1000 Agitated Behavior Scale (DO NOT LEAVE BLANKS) Short attention span, easy distractibility, inability to concentrate: Absent Impulsive, impatient, low tolerance for pain or frustration: Absent Uncooperative, resistant to care, demanding: Absent Violent and/or threatening violence toward people or property: Absent Explosive and/or unpredictable anger: Absent Rocking, rubbing, moaning, or other  self-stimulating  behavior: Absent Pulling at tubes, restraints, etc.: Absent Wandering from treatment areas: Absent Restlessness, pacing, excessive movement: Absent Repetitive behaviors, motor, and/or verbal: Absent Rapid, loud, or excessive talking: Absent Sudden changes of mood: Absent Easily initiated or excessive crying and/or laughter: Absent Self-abusiveness, physical and/or verbal: Absent Agitated behavior scale total score: 14  Therapy/Group: Individual Therapy  Rashada Klontz A Kiefer Opheim 03/23/2022, 12:34 PM

## 2022-03-23 NOTE — Progress Notes (Addendum)
PROGRESS NOTE   Subjective/Complaints: Does not find tylenol helpful- changed to prn, also has advil prn Agitation improved, decrease Depakote to 500mg  BID Vitals reviewed and stable  ROS: +low back pain- improved, +anxiety, agitation improved   Objective:   No results found. No results for input(s): "WBC", "HGB", "HCT", "PLT" in the last 72 hours.  No results for input(s): "NA", "K", "CL", "CO2", "GLUCOSE", "BUN", "CREATININE", "CALCIUM" in the last 72 hours.   Intake/Output Summary (Last 24 hours) at 03/23/2022 1434 Last data filed at 03/23/2022 1300 Gross per 24 hour  Intake 540 ml  Output --  Net 540 ml        Physical Exam: Vital Signs Blood pressure 120/82, pulse 90, temperature 98.2 F (36.8 C), resp. rate 16, height 6\' 1"  (1.854 m), weight 72.2 kg, SpO2 91 %.   General: No acute distress, BMI 21 Mood and affect are appropriate Heart: Regular rate and rhythm no rubs murmurs or extra sounds Lungs: Clear to auscultation, breathing unlabored, no rales or wheezes Abdomen: Positive bowel sounds, soft nontender to palpation, nondistended Extremities: No clubbing, cyanosis. Mild nonpitting localized swelling of bilateral wrists. ROM reduced in bilateral wrist extension. No TTP.  Skin: No evidence of breakdown, no evidence of rash Neurologic: Cranial nerves II through XII intact,  BL UE intention tremor on FTN testing. Reslexes 2+ BL UE.  Psych: intermittent frustration but agitation much improved Musculoskeletal: motor strength is 5/5 in bilateral UE and Les throughout. Ambulating RW with CG  Assessment/Plan: 1. Functional deficits which require 3+ hours per day of interdisciplinary therapy in a comprehensive inpatient rehab setting. Physiatrist is providing close team supervision and 24 hour management of active medical problems listed below. Physiatrist and rehab team continue to assess barriers to  discharge/monitor patient progress toward functional and medical goals  Care Tool:  Bathing    Body parts bathed by patient: Chest, Abdomen, Front perineal area, Right upper leg, Left upper leg, Face   Body parts bathed by helper: Left lower leg, Right lower leg, Buttocks, Left arm, Right arm     Bathing assist Assist Level: Maximal Assistance - Patient 24 - 49%     Upper Body Dressing/Undressing Upper body dressing   What is the patient wearing?: Pull over shirt    Upper body assist Assist Level: Minimal Assistance - Patient > 75%    Lower Body Dressing/Undressing Lower body dressing      What is the patient wearing?: Pants     Lower body assist Assist for lower body dressing: Maximal Assistance - Patient 25 - 49%     Toileting Toileting    Toileting assist Assist for toileting: Moderate Assistance - Patient 50 - 74%     Transfers Chair/bed transfer  Transfers assist     Chair/bed transfer assist level: Contact Guard/Touching assist     Locomotion Ambulation   Ambulation assist      Assist level: Supervision/Verbal cueing Assistive device: Walker-rolling Max distance: 138ft   Walk 10 feet activity   Assist     Assist level: Supervision/Verbal cueing Assistive device: Walker-rolling   Walk 50 feet activity   Assist Walk 50 feet with 2 turns activity did  not occur: Safety/medical concerns  Assist level: Supervision/Verbal cueing Assistive device: Walker-rolling    Walk 150 feet activity   Assist Walk 150 feet activity did not occur: Safety/medical concerns  Assist level: Supervision/Verbal cueing Assistive device: Walker-rolling    Walk 10 feet on uneven surface  activity   Assist     Assist level: Contact Guard/Touching assist Assistive device: Walker-rolling   Wheelchair     Assist Is the patient using a wheelchair?: Yes Type of Wheelchair: Manual    Wheelchair assist level: Contact Guard/Touching assist Max  wheelchair distance: 20    Wheelchair 50 feet with 2 turns activity    Assist        Assist Level: Maximal Assistance - Patient 25 - 49%   Wheelchair 150 feet activity     Assist      Assist Level: Total Assistance - Patient < 25%   Blood pressure 120/82, pulse 90, temperature 98.2 F (36.8 C), resp. rate 16, height 6\' 1"  (1.854 m), weight 72.2 kg, SpO2 91 %.  Medical Problem List and Plan: 1. 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  Impaired mobility: continue Lovenox, SCDs ordered             -antiplatelet therapy: N/A 3. Low back pain: Will schedule tylenol qid --continue advil prn. kpad ordered.   4. Mood/Behavior/Sleep: LCSW to follow for evaluation and support.             --Continue Seroquel at nights for sleep/agitation. Sleep wake chart.              -antipsychotic agents:  Seroquel. 5. Neuropsych/cognition: This patient is capable of making decisions on his own behalf. 6. Skin/Wound Care: Routine pressure relief measures.              --continue protein supplements.  7. Fluids/Electrolytes/Nutrition: Monitor I/O. Check CMET in am 8. Acute on chronic SDH:  has resolved and has been seizure free.             --agitation/delirium resolved? Continue Klonopin 0.5 mg TID (decreased ib 01/08) ---Continue Seroquel 200 mg/HS (decreased 01/080 9. Tachycardia: resolved, d/c lopressor 10. VDRF s/Bill trach/rib Fx, R-PTX: Respiratory status stable.  11.  Urinary retention: restart flomax since hypotension improved 12.  ABLA: Discussed improvement in Hgb 1/16.  13. H/o polysubstance abuse including THC, cocaine, and EOTH: continue Thiamine daily              --Vitamin B1, Vitamin B12 and Vitamin D levels  WNL.             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 Hyperglycemia: Hgb A1c-5.6 and off tube feeds.  Will discontinue CBG checks. Start magnesium gluconate 250mg  HS 15. Bilateral wrist pain: voltaren ordered 16. Hypotension: d/c klonpin, decrease robaxin to 750mg  q8H prn 17. Tremor: can consider propanolol if BP remains stable    LOS: 7 days A FACE TO FACE EVALUATION WAS PERFORMED  Bill Brooks Bill Brooks  03/23/2022, 2:34 PM

## 2022-03-23 NOTE — Progress Notes (Signed)
Physical Therapy Session Note  Patient Details  Name: Bill Brooks. MRN: [id number] Date of Birth: 1961-01-27  Today's Date: 03/23/2022 PT Individual Time: 1100-1155 PT Individual Time Calculation (min): 55 min   Short Term Goals: Week 1:  PT Short Term Goal 1 (Week 1): Pt will transfer sup to sit w/ CGA PT Short Term Goal 2 (Week 1): Pt will transfer sit to stand w/ min A from low heights. PT Short Term Goal 3 (Week 1): Pt will amb w/ RW and CGA x 75'  Skilled Therapeutic Interventions/Progress Updates:      Pt sitting EOB to start - agreeable to PT tx. Reports neck and lower back stiffness, no pain. Stand<>pivot transfer with CGA from EOB to w/c and then transported to day room rehab gym.   Gait training during session with no AD and mostly CGA with intermittent minA for LOB's. Ambulating distances > 145ft. Primary gait deficits with narrow BOS, slightly forward flexed trunk, and downward gaze. Challenged gait training included forward/backward walking, tandem walking, side stepping, ball toss to self, and dynamic gait training in agility ladder. minA for dynamic gait.   Dynamic standing balance with alternating toe taps to cones and shooting basketball to close rim (limited accuracy and ability to lift ball due to RUE weakness. Min guard for dynamic standing activities.   Rest breaks provided as needed for fatigue and low back discomfort.   Concluded session seated EOB with alarm on, all needs met.   Therapy Documentation Precautions:  Precautions Precautions: Fall Restrictions Weight Bearing Restrictions: Yes RUE Weight Bearing: Weight bearing as tolerated RUE Partial Weight Bearing Percentage or Pounds: RUE now WBAT LUE Weight Bearing: Weight bearing as tolerated Other Position/Activity Restrictions: No ROM restrictions General:     Therapy/Group: Individual Therapy  Claron Rosencrans P Jarmaine Ehrler 03/23/2022, 7:30 AM

## 2022-03-23 NOTE — Progress Notes (Signed)
Occupational Therapy TBI Note  Patient Details  Name: Bill Brooks. MRN: [id number] Date of Birth: 03/02/1961  Today's Date: 03/23/2022 OT Individual Time: 3474-2595 OT Individual Time Calculation (min): 45 min    Short Term Goals: Week 1:  OT Short Term Goal 1 (Week 1): Patient will be given built up handles to increase independence with self-feeding and grooming tasks. OT Short Term Goal 2 (Week 1): Patient will tolerate standing at the sink for 2 minutes in preparation for BADL tasks. OT Short Term Goal 3 (Week 1): Patient will demonstrate improved grip strength by opening deodorant lid.  Skilled Therapeutic Interventions/Progress Updates:  Skilled OT intervention completed with focus on ambulatory endurance, scapular mobilization and pain management. Pt received seated EOB, agreeable to session. Unrated pain reported in bilateral shoulders though improved from yesterday; pre-medicated. OT offered myofascial release, heat, rest breaks and repositioning for pain reduction throughout.   Pt recalled eating breakfast this morning and was able to recall to OT the contents for OT to fill out the visual meal log. Declined other self-care needs.  Min A to donn jacket, supervision sit > stand from elevated bed using RW, then CGA to supervision ambulatory transfer to main gym with cues needed for less leaning on the RW and head/chest up due to significant flexed trunk. Sat EOM then CGA transition to supine, with pillows at head and bolster under knees placed for comfort.  OT placed hot pack to cervical, traps and lumbar regions in preparation for myofascial release and soft tissue mobilization of the scapula-humeral regions to increase PROM and AROM needed for functional use of BUE during BADLs. Pt presents with max restrictions in supraspinatus, subscapularis and traps, though with manual technique did improve and pt tolerated well. OT completed scap mob for protraction/retraction elevation and  depression, then PROM of shoulder flexion with LUE restricted > R.   Min A needed to transition to EOM, then CGA sit > stand using RW, and supervision ambulatory transfer back to room, then transitioned back to bed with supervision.   Pt remained upright in bed, with bed alarm on/activated, and with all needs in reach at end of session.   Therapy Documentation Precautions:  Precautions Precautions: Fall Restrictions Weight Bearing Restrictions: Yes RUE Weight Bearing: Weight bearing as tolerated RUE Partial Weight Bearing Percentage or Pounds: RUE now WBAT LUE Weight Bearing: Weight bearing as tolerated Other Position/Activity Restrictions: No ROM restrictions  Agitated Behavior Scale: TBI Observation Details Observation Environment: CIR Start of observation period - Date: 03/23/22 Start of observation period - Time: 0800 End of observation period - Date: 03/23/22 End of observation period - Time: 0845 Agitated Behavior Scale (DO NOT LEAVE BLANKS) Short attention span, easy distractibility, inability to concentrate: Absent Impulsive, impatient, low tolerance for pain or frustration: Absent Uncooperative, resistant to care, demanding: Absent Violent and/or threatening violence toward people or property: Absent Explosive and/or unpredictable anger: Absent Rocking, rubbing, moaning, or other self-stimulating behavior: Absent Pulling at tubes, restraints, etc.: Absent Wandering from treatment areas: Absent Restlessness, pacing, excessive movement: Absent Repetitive behaviors, motor, and/or verbal: Absent Rapid, loud, or excessive talking: Absent Sudden changes of mood: Absent Easily initiated or excessive crying and/or laughter: Absent Self-abusiveness, physical and/or verbal: Absent Agitated behavior scale total score: 14     Therapy/Group: Individual Therapy  Blase Mess, MS, OTR/L  03/23/2022, 2:00 PM

## 2022-03-23 NOTE — Progress Notes (Signed)
Physical Therapy Session Note  Patient Details  Name: Bill Brooks. MRN: [id number] Date of Birth: 1960/11/25  Today's Date: 03/23/2022 PT Individual Time: 1300-1357 PT Individual Time Calculation (min): 57 min   Short Term Goals: Week 1:  PT Short Term Goal 1 (Week 1): Pt will transfer sup to sit w/ CGA PT Short Term Goal 2 (Week 1): Pt will transfer sit to stand w/ min A from low heights. PT Short Term Goal 3 (Week 1): Pt will amb w/ RW and CGA x 75'  Skilled Therapeutic Interventions/Progress Updates:   Received pt sitting EOB finishing lunch. Pt agreeable to PT treatment and reported pain 11/10 in low back with mobility but reported the pain was "much better" than yesterday? Session with emphasis on functional mobility/transfers, generalized strengthening and endurance, dynamic standing balance/coordination, and gait training. Pt requesting to go outside for "fresh air" and transferred into [facility] via stand<>pivot without AD and CGA. Pt transported outside to entrance of [place] in [place] dependently for time management purposes. Stood with RW and supervision and ambulated >245ft with RW and close supervision over uneven surfaces (concrete) and up/down mild slopes; then requested to return inside due to being too cold.   Transported back to 4W therapy gym in [facility] dependently. In // bars, pt performed the following activities with emphasis on LE strength and balance: -lateral step ups onto 6in step with 1 UE support 2x10 bilaterally -lateral monster squats with grn TB x4 laps with BUE support fading to 1 UE support with emphasis on hip abductor strengthening Attempted standing squats with grn TB around thighs, however pt unable to tolerate due to back pain and required multiple extended rest breaks due to low back pain. Pt ambulated 142ft with RW and supervision back to room and requested to return to bed. Doffed shoes independently and transferred sit<>supine with supervision. Concluded session with pt  semi-reclined in bed, needs within reach, and bed alarm on. Placed k-pad along back for pain relief and provided pt with fresh drink.   Therapy Documentation Precautions:  Precautions Precautions: Fall Restrictions Weight Bearing Restrictions: Yes RUE Weight Bearing: Weight bearing as tolerated RUE Partial Weight Bearing Percentage or Pounds: RUE now WBAT LUE Weight Bearing: Weight bearing as tolerated Other Position/Activity Restrictions: No ROM restrictions  Therapy/Group: Individual Therapy Alfonse Alpers PT, DPT  03/23/2022, 7:13 AM

## 2022-03-24 NOTE — Plan of Care (Signed)
  Problem: Consults Goal: RH GENERAL PATIENT EDUCATION Description: See Patient Education module for education specifics. Outcome: Progressing   Problem: RH BOWEL ELIMINATION Goal: RH STG MANAGE BOWEL WITH ASSISTANCE Description: STG Manage Bowel with mod I Assistance. Outcome: Progressing Goal: RH STG MANAGE BOWEL W/MEDICATION W/ASSISTANCE Description: STG Manage Bowel with Medication with mod I Assistance. Outcome: Progressing   Problem: RH BLADDER ELIMINATION Goal: RH STG MANAGE BLADDER WITH ASSISTANCE Description: STG Manage Bladder With mod I Assistance Outcome: Progressing Goal: RH STG MANAGE BLADDER WITH MEDICATION WITH ASSISTANCE Description: STG Manage Bladder With Medication With mod I Assistance. Outcome: Progressing

## 2022-03-24 NOTE — Progress Notes (Addendum)
Speech Language Pathology TBI Note  Patient Details  Name: Bill Brooks. MRN: [id number] Date of Birth: 1960/11/16  Today's Date: 03/24/2022 SLP Individual Time: 1400-1458 SLP Individual Time Calculation (min): 58 min  Short Term Goals: Week 1: SLP Short Term Goal 1 (Week 1): Patient will demonstrate functional problem solving for mildly complex tasks with Min verbal and visual cues. SLP Short Term Goal 2 (Week 1): Patient will recall new, daily information with use of memory compensatory strategies with Min verbal and visual cues. SLP Short Term Goal 3 (Week 1): Patient will demonstrate emergent awareness of errors during functional and familiar tasks with Min verbal cues.  Skilled Therapeutic Interventions: Pt seen this date for skilled ST intervention targeting cognitive goals outlined above. Pt received awake and lying supine. Agreeable to intervention in speech office. Pleasant and participatory throughout.   Today's session with emphasis on cognitive skill training within the context of therapeutic and functional tasks. Pt completed counting bills and coins task with 100% accuracy given Min-Mod to Mod A verbal and visual cues to maintain attention to task, self-monitor + correct, and assist with working memory; 0% accuracy without cues. Educated and reinforced need for assistance and supervision with money and medication management tasks upon d/c; pt verbalized understanding and states "that has already been taken care of."   Following money task, pt verbalized that he needed to wash his clothes. Gathered clothes with Sup A and verbalized sequence/directed care for placing clothes in the washing machine and getting dressed with overall Min A for safety awareness given ongoing impulsivity and attempts to stand without locking brakes on w/c, not having CGA ready, etc.. Therapeutically, pt verbally identified problems in ADL's pictures with overall Mod I to Sup A. Per chart review, pt utilized  questions generated during last session to ask MD; provided positive reinforcement for self-advocacy and agency. Oriented to date with Sup-Min A and completed simple problem-solving re: time clothes will be completed and how to complete sequence given Sup A. Pt continues to make progress.     Pt left in room with all safety measures activated, call bell within reach, and all immediate needs met. Continue per current ST POC.  Pain Reports pain in his back - SLP provided distraction as pain management and LPN notified.   Agitated Behavior Scale: TBI Observation Details Observation Environment: CIR Start of observation period - Date: 03/24/22 Start of observation period - Time: 1400 End of observation period - Date: 03/24/22 End of observation period - Time: 1455 Agitated Behavior Scale (DO NOT LEAVE BLANKS) Short attention span, easy distractibility, inability to concentrate: Absent Impulsive, impatient, low tolerance for pain or frustration: Absent Uncooperative, resistant to care, demanding: Absent Violent and/or threatening violence toward people or property: Absent Explosive and/or unpredictable anger: Absent Rocking, rubbing, moaning, or other self-stimulating behavior: Absent Pulling at tubes, restraints, etc.: Absent Wandering from treatment areas: Absent Restlessness, pacing, excessive movement: Absent Repetitive behaviors, motor, and/or verbal: Absent Rapid, loud, or excessive talking: Absent Sudden changes of mood: Absent Easily initiated or excessive crying and/or laughter: Absent Self-abusiveness, physical and/or verbal: Absent Agitated behavior scale total score: 14  Therapy/Group: Individual Therapy  [name] [name] 03/24/2022, 2:59 PM

## 2022-03-24 NOTE — Progress Notes (Signed)
Occupational Therapy TBI Note  Patient Details  Name: Bill Brooks. MRN: [id number] Date of Birth: September 17, 1960  Today's Date: 03/24/2022 OT Individual Time: 1115-1200 OT Individual Time Calculation (min): 45 min    Short Term Goals: Week 1:  OT Short Term Goal 1 (Week 1): Patient will be given built up handles to increase independence with self-feeding and grooming tasks. OT Short Term Goal 2 (Week 1): Patient will tolerate standing at the sink for 2 minutes in preparation for BADL tasks. OT Short Term Goal 3 (Week 1): Patient will demonstrate improved grip strength by opening deodorant lid.  Skilled Therapeutic Interventions/Progress Updates:    Pt received supine in bed agreeable to therapy. Reporting pain and discomfort in shoulders. Provided hot pack to cervical and trap regions in preparation for myofacial release and ROM exercises. Engaged in [place] peg design of cross with pt forming with 1 error. Provided PROM to BUEs focusing on shoulder flexion, wrist and digit flexion/extension, and elbow flexion/extension. At end of session, pt returned to supine and left with all needs in reach. Pt pleasant and cooperative throughout session with no agitation noted.   Therapy Documentation Precautions:  Precautions Precautions: Fall Restrictions Weight Bearing Restrictions: Yes RUE Weight Bearing: Weight bearing as tolerated RUE Partial Weight Bearing Percentage or Pounds: RUE now WBAT LUE Weight Bearing: Weight bearing as tolerated Other Position/Activity Restrictions: No ROM restrictions General:   Vital Signs:   Pain: Pain Assessment Pain Score: Asleep Agitated Behavior Scale: TBI      ADL: ADL Eating: Set up, Minimal assistance Grooming: Moderate assistance Upper Body Bathing: Maximal assistance Lower Body Bathing: Maximal assistance Upper Body Dressing: Maximal assistance Lower Body Dressing: Maximal assistance Toileting: Moderate assistance Toilet Transfer: Moderate  assistance, Minimal assistance Tub/Shower Transfer: Minimal assistance, Moderate assistance Vision   Perception    Praxis   Exercises:   Other Treatments:     Therapy/Group: Individual Therapy  Duayne Cal 03/24/2022, 12:05 PM

## 2022-03-24 NOTE — Progress Notes (Signed)
PROGRESS NOTE   Subjective/Complaints: Doesn't really have many complaints this morning. Feeling pretty good, denies pain. Slept well. Had a BM this morning. Had some questions about his medications (wanting something for pain, inquiring about depakote duration, inquiring about urinary medications, tremor medications) but some of these were addressed by the doctor yesterday. Will follow up with MD on Monday for any lingering questions after we reviewed much of it today.   ROS: +low back pain- improved, +anxiety, +agitation improved +HA-improving +functional incontinence. Denies CP, SOB, abd pain, N/V/D/C, dysuria, dizziness, vision changes, or other complaints    Objective:   No results found. No results for input(s): "WBC", "HGB", "HCT", "PLT" in the last 72 hours.  No results for input(s): "NA", "K", "CL", "CO2", "GLUCOSE", "BUN", "CREATININE", "CALCIUM" in the last 72 hours.   Intake/Output Summary (Last 24 hours) at 03/24/2022 0710 Last data filed at 03/23/2022 1904 Gross per 24 hour  Intake 300 ml  Output --  Net 300 ml         Physical Exam: Vital Signs Blood pressure 100/87, pulse 89, temperature 98.3 F (36.8 C), temperature source Oral, resp. rate 18, height 6\' 1"  (1.854 m), weight 72.2 kg, SpO2 100 %.   General: No acute distress, BMI 21 Mood and affect are appropriate Heart: Regular rate and rhythm no rubs murmurs or extra sounds Lungs: Clear to auscultation, breathing unlabored, no rales or wheezes Abdomen: Positive bowel sounds, soft nontender to palpation, nondistended Extremities: No clubbing, cyanosis. Mild nonpitting localized swelling of bilateral wrists-improving. ROM reduced in bilateral wrist extension. No TTP.  Skin: No evidence of breakdown, no evidence of rash Neurologic: Cranial nerves II through XII intact,  BL UE intention tremor on FTN testing. Reflexes 2+ BL UE.  Psych: intermittent  frustration but agitation much improved, calm and cooperative today Musculoskeletal: motor strength is 5/5 in bilateral UE and Les throughout. Ambulating RW with CG  Assessment/Plan: 1. Functional deficits which require 3+ hours per day of interdisciplinary therapy in a comprehensive inpatient rehab setting. Physiatrist is providing close team supervision and 24 hour management of active medical problems listed below. Physiatrist and rehab team continue to assess barriers to discharge/monitor patient progress toward functional and medical goals  Care Tool:  Bathing    Body parts bathed by patient: Chest, Abdomen, Front perineal area, Right upper leg, Left upper leg, Face   Body parts bathed by helper: Left lower leg, Right lower leg, Buttocks, Left arm, Right arm     Bathing assist Assist Level: Maximal Assistance - Patient 24 - 49%     Upper Body Dressing/Undressing Upper body dressing   What is the patient wearing?: Pull over shirt    Upper body assist Assist Level: Minimal Assistance - Patient > 75%    Lower Body Dressing/Undressing Lower body dressing      What is the patient wearing?: Pants     Lower body assist Assist for lower body dressing: Maximal Assistance - Patient 25 - 49%     Toileting Toileting    Toileting assist Assist for toileting: Moderate Assistance - Patient 50 - 74%     Transfers Chair/bed transfer  Transfers assist  Chair/bed transfer assist level: Contact Guard/Touching assist     Locomotion Ambulation   Ambulation assist      Assist level: Supervision/Verbal cueing Assistive device: Walker-rolling Max distance: 186ft   Walk 10 feet activity   Assist     Assist level: Supervision/Verbal cueing Assistive device: Walker-rolling   Walk 50 feet activity   Assist Walk 50 feet with 2 turns activity did not occur: Safety/medical concerns  Assist level: Supervision/Verbal cueing Assistive device: Walker-rolling     Walk 150 feet activity   Assist Walk 150 feet activity did not occur: Safety/medical concerns  Assist level: Supervision/Verbal cueing Assistive device: Walker-rolling    Walk 10 feet on uneven surface  activity   Assist     Assist level: Contact Guard/Touching assist Assistive device: Walker-rolling   Wheelchair     Assist Is the patient using a wheelchair?: Yes Type of Wheelchair: Manual    Wheelchair assist level: Contact Guard/Touching assist Max wheelchair distance: 20    Wheelchair 50 feet with 2 turns activity    Assist        Assist Level: Maximal Assistance - Patient 25 - 49%   Wheelchair 150 feet activity     Assist      Assist Level: Total Assistance - Patient < 25%   Blood pressure 100/87, pulse 89, temperature 98.3 F (36.8 C), temperature source Oral, resp. rate 18, height 6\' 1"  (1.854 m), weight 72.2 kg, SpO2 100 %.  Medical Problem List and Plan: 1. 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  Impaired mobility: continue Lovenox 30mg  q12h, SCDs ordered             -antiplatelet therapy: N/A 3. Low back pain: Tylenol 325-650 q4h PRN, Voltaren 4g QID, Robaxin 750mg  q8h PRN -continue advil 400mg  q8h prn. kpad ordered.  -03/24/22 pain improving, continue current regimen  4. Mood/Behavior/Sleep: LCSW to follow for evaluation and support. -Continue Seroquel 200mg  at nights for sleep/agitation. Sleep wake chart. Trazodone PRN.  -Start magnesium gluconate 250mg  HS             -antipsychotic agents:  Seroquel. 5. Neuropsych/cognition: This patient is capable of making decisions on his own behalf. 6. Skin/Wound Care: Routine pressure relief measures.              -continue  protein supplements.  7. Fluids/Electrolytes/Nutrition: Monitor I/O. Monitor weekly labs 8. Acute on chronic SDH:  has resolved and has been seizure free.  -Depakote 500mg  BID  -agitation/delirium resolved? Continue Klonopin 0.5 mg TID (decreased on 01/08); discontinued 03/21/22 -Continue Seroquel 200 mg/HS (decreased 01/08) 9. Tachycardia: resolved, d/c lopressor 10. VDRF s/p trach/rib Fx, R-PTX: Respiratory status stable.  11.  Urinary retention: restart flomax since hypotension improved -03/24/22 having functional incontinence, encouraged use of urinal-- will f/up with MD on Monday regarding med question 12.  ABLA: Discussed improvement in Hgb 1/16.  13. H/o polysubstance abuse including THC, cocaine, and EOTH: continue Thiamine daily              -  Vitamin B1, Vitamin B12 and Vitamin D levels WNL.             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 Hyperglycemia: Hgb A1c-5.6 and off tube feeds.  Will discontinue CBG checks.  15. Bilateral wrist pain: voltaren 1% 4g QID ordered 16. Hypotension: d/c klonpin, decrease robaxin to 750mg  q8H prn  -03/24/22 BPs low end but stable, continue to monitor  Vitals:   03/20/22 1324 03/20/22 1928 03/21/22 1307 03/21/22 2017  BP: 90/65 98/88 99/69  94/75   03/22/22 0256 03/22/22 0525 03/22/22 1315 03/22/22 1946  BP: 120/80 99/74 114/80 104/72   03/23/22 0403 03/23/22 1300 03/23/22 2041 03/24/22 0604  BP: 103/75 120/82 101/67 100/87    17. Tremor: can consider propanolol (10mg  TID) if BP remains stable -03/24/22 discussed with pt that BP still low-end so will hold off on this for now, no tremor noted during exam today    LOS: 8 days A FACE TO [place] 03/24/2022, 7:10 AM

## 2022-03-25 NOTE — Progress Notes (Addendum)
Occupational Therapy TBI Note  Patient Details  Name: Bill Brooks. MRN: [id number] Date of Birth: [birth year]  Today's Date: 03/25/2022 OT Individual Time: 7425-9563 OT Individual Time Calculation (min): 58 min    Short Term Goals: Week 1:  OT Short Term Goal 1 (Week 1): Patient will be given built up handles to increase independence with self-feeding and grooming tasks. OT Short Term Goal 2 (Week 1): Patient will tolerate standing at the sink for 2 minutes in preparation for BADL tasks. OT Short Term Goal 3 (Week 1): Patient will demonstrate improved grip strength by opening deodorant lid.  Skilled Therapeutic Interventions/Progress Updates:  Pt received sitting EOB for skilled OT session with focus on generalized strengthening/ROM. Pt agreeable to interventions, demonstrating overall pleasant mood. Pt with un-rate pain in bilateral wrists, stating "when it's cold outside the pain is real bad" in reference to pain in wrists. OT offering intermediate rest breaks and positioning suggestions throughout session to address pain/fatigue and maximize participation/safety in session.   Pt with bed alarm going off upon arrival of OT, assumably unaware of meaning and/or bothered by the sound. Pt threads pants with supervision, standing without AD + CGA to don over bottom/hips. Pt requires increased A, overall Mod A, to doff/don socks/shoes, stating "I've got no strength in my wrists since the accident." Pt able to intiate/terminate doffing of socks, requiring A over heel. Pt recalling introduction of sock aid/shoe horn from previous OT session. Handout provided for family to consider purchasing.   Pt ambulates from room<>day room, and around nurses station x1 for general conditioning with CGA-SUP +RW. In day room, pt participates in ~10 minutes of nustep machine exercise for endurance training/generalized strengthening. Pt unable to continue participate in nustep when asked simple biographical questions  for dual-task challenge, observed stopping and re-starting task. Pt requires multiple rest-breaks throughout 10 minute block, but tolerates UE ROM needed with minimal to no complaints of discomfort. Further UE strengthening attempted with use of 2lb DB, but pt unable to tolerate due to increased wrist pain/discomfort, wrist-weights unsuccessful due to patient's thin arms.   Time spent assessing wrist ROM and tolerance, with decreased ROM noted on L>R hand. Gentle wrist ROM exercises then completed with focus on wrist flexion/extension and ulnar/radial deviation, pt tolerating well and encouraged to continue performing outside of therapy.   Pt shared he is going to the bathroom independently when nursing staff "takes too long." OT re-educating patient on fall risk prevention through use of call bell.    Pt remained resting in bed with all immediate needs met and bed alarm activated at end of session. Pt continues to be appropriate for skilled OT intervention to promote further functional independence.   Therapy Documentation Precautions:  Precautions Precautions: Fall Restrictions Weight Bearing Restrictions: Yes RUE Weight Bearing: Weight bearing as tolerated RUE Partial Weight Bearing Percentage or Pounds: RUE now WBAT LUE Weight Bearing: Weight bearing as tolerated Other Position/Activity Restrictions: No ROM restrictions  Agitated Behavior Scale: TBI Observation Details Observation Environment: CIR Start of observation period - Date: 03/25/22 Start of observation period - Time: 1108 End of observation period - Date: 03/25/22 End of observation period - Time: 1206 Agitated Behavior Scale (DO NOT LEAVE BLANKS) Short attention span, easy distractibility, inability to concentrate: Absent Impulsive, impatient, low tolerance for pain or frustration: Absent Uncooperative, resistant to care, demanding: Absent Violent and/or threatening violence toward people or property: Absent Explosive  and/or unpredictable anger: Absent Rocking, rubbing, moaning, or other self-stimulating behavior: Absent Pulling at  tubes, restraints, etc.: Absent Wandering from treatment areas: Absent Restlessness, pacing, excessive movement: Absent Repetitive behaviors, motor, and/or verbal: Absent Rapid, loud, or excessive talking: Absent Sudden changes of mood: Absent Easily initiated or excessive crying and/or laughter: Absent Self-abusiveness, physical and/or verbal: Absent Agitated behavior scale total score: 14  Therapy/Group: Individual Therapy  Maudie Mercury, OTR/L, MSOT  03/25/2022, 6:22 AM

## 2022-03-25 NOTE — H&P (Signed)
H&P was competed by admitting physician.

## 2022-03-25 NOTE — Progress Notes (Signed)
PROGRESS NOTE   Subjective/Complaints: Doesn't really have many complaints this morning again. Feeling good, denies pain. Slept well. Had a BM this morning again and it was normal. Urinated in urinal this morning. Denies any other complaints.   ROS: +low back pain- improved, +anxiety-improved, +agitation improved +HA-improving +functional incontinence. Denies CP, SOB, abd pain, N/V/D/C, dysuria, dizziness, vision changes, or other complaints    Objective:   No results found. No results for input(s): "WBC", "HGB", "HCT", "PLT" in the last 72 hours.  No results for input(s): "NA", "K", "CL", "CO2", "GLUCOSE", "BUN", "CREATININE", "CALCIUM" in the last 72 hours.   Intake/Output Summary (Last 24 hours) at 03/25/2022 0720 Last data filed at 03/24/2022 2300 Gross per 24 hour  Intake 716 ml  Output --  Net 716 ml         Physical Exam: Vital Signs Blood pressure 102/72, pulse 86, temperature 98.3 F (36.8 C), temperature source Oral, resp. rate 14, height 6\' 1"  (1.854 m), weight 72.2 kg, SpO2 98 %.   General: No acute distress, BMI 21 Mood and affect are appropriate Heart: Regular rate and rhythm no rubs murmurs or extra sounds Lungs: Clear to auscultation, breathing unlabored, no rales or wheezes Abdomen: Positive bowel sounds, soft nontender to palpation, nondistended Extremities: No clubbing, cyanosis. Mild nonpitting localized swelling of bilateral wrists-improving. ROM reduced in bilateral wrist extension-improved. No TTP.  Skin: No evidence of breakdown, no evidence of rash Neurologic: Cranial nerves II through XII intact,  BL UE intention tremor on FTN testing. Reflexes 2+ BL UE.  Psych: intermittent frustration but agitation much improved, calm and cooperative today Musculoskeletal: motor strength is 5/5 in bilateral UE and Les throughout. Ambulating RW with CG  Assessment/Plan: 1. Functional deficits which require  3+ hours per day of interdisciplinary therapy in a comprehensive inpatient rehab setting. Physiatrist is providing close team supervision and 24 hour management of active medical problems listed below. Physiatrist and rehab team continue to assess barriers to discharge/monitor patient progress toward functional and medical goals  Care Tool:  Bathing    Body parts bathed by patient: Chest, Abdomen, Front perineal area, Right upper leg, Left upper leg, Face   Body parts bathed by helper: Left lower leg, Right lower leg, Buttocks, Left arm, Right arm     Bathing assist Assist Level: Maximal Assistance - Patient 24 - 49%     Upper Body Dressing/Undressing Upper body dressing   What is the patient wearing?: Pull over shirt    Upper body assist Assist Level: Minimal Assistance - Patient > 75%    Lower Body Dressing/Undressing Lower body dressing      What is the patient wearing?: Pants     Lower body assist Assist for lower body dressing: Maximal Assistance - Patient 25 - 49%     Toileting Toileting    Toileting assist Assist for toileting: Moderate Assistance - Patient 50 - 74%     Transfers Chair/bed transfer  Transfers assist     Chair/bed transfer assist level: Contact Guard/Touching assist     Locomotion Ambulation   Ambulation assist      Assist level: Supervision/Verbal cueing Assistive device: Walker-rolling Max distance: 118ft  Walk 10 feet activity   Assist     Assist level: Supervision/Verbal cueing Assistive device: Walker-rolling   Walk 50 feet activity   Assist Walk 50 feet with 2 turns activity did not occur: Safety/medical concerns  Assist level: Supervision/Verbal cueing Assistive device: Walker-rolling    Walk 150 feet activity   Assist Walk 150 feet activity did not occur: Safety/medical concerns  Assist level: Supervision/Verbal cueing Assistive device: Walker-rolling    Walk 10 feet on uneven surface   activity   Assist     Assist level: Contact Guard/Touching assist Assistive device: Walker-rolling   Wheelchair     Assist Is the patient using a wheelchair?: Yes Type of Wheelchair: Manual    Wheelchair assist level: Contact Guard/Touching assist Max wheelchair distance: 20    Wheelchair 50 feet with 2 turns activity    Assist        Assist Level: Maximal Assistance - Patient 25 - 49%   Wheelchair 150 feet activity     Assist      Assist Level: Total Assistance - Patient < 25%   Blood pressure 102/72, pulse 86, temperature 98.3 F (36.8 C), temperature source Oral, resp. rate 14, height 6\' 1"  (1.854 m), weight 72.2 kg, SpO2 98 %.  Medical Problem List and Plan: 1. 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  Impaired mobility: continue Lovenox 30mg  q12h, SCDs ordered             -antiplatelet therapy: N/A 3. Low back pain: Tylenol 325-650 q4h PRN, Voltaren 4g QID, Robaxin 750mg  q8h PRN -continue advil 400mg  q8h prn. kpad ordered.  -03/25/22 pain improving, continue current regimen  4. Mood/Behavior/Sleep: LCSW to follow for evaluation and support. -Continue Seroquel 200mg  at nights for sleep/agitation. Sleep wake chart. Trazodone PRN.  -Start magnesium gluconate 250mg  HS             -antipsychotic agents:  Seroquel. 5. Neuropsych/cognition: This patient is capable of making decisions on his own behalf. 6. Skin/Wound Care: Routine pressure relief measures.              -continue protein supplements.  7. Fluids/Electrolytes/Nutrition: Monitor I/O. Monitor weekly labs 8. Acute on chronic SDH:  has resolved and has been seizure free.  -Depakote 500mg  BID   -agitation/delirium resolved? Continue Klonopin 0.5 mg TID (decreased on 01/08); discontinued 03/21/22 -Continue Seroquel 200 mg/HS (decreased 01/08) 9. Tachycardia: resolved, d/c lopressor 10. VDRF s/p trach/rib Fx, R-PTX: Respiratory status stable.  11.  Urinary retention: restart flomax since hypotension improved -03/24/22 having functional incontinence, encouraged use of urinal-- he will f/up with MD on Monday regarding med question 12.  ABLA: Discussed improvement in Hgb 1/16. Monitor on weekly labs (next 03/26/22) 13. H/o polysubstance abuse including THC, cocaine, and EOTH: continue vitamin D3 1000U QD, Folic acid 1mg  QD, multivitamin, Thiamine 100mg  QD             -Vitamin B1, Vitamin B12 and Vitamin D  levels WNL.             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 Hyperglycemia: Hgb A1c-5.6 and off tube feeds.  Will discontinue CBG checks.  15. Bilateral wrist pain: voltaren 1% 4g QID ordered 16. Hypotension: d/c klonpin, decrease robaxin to 750mg  q8H prn  -03/25/22 BPs stable on softer side, continue to monitor  Vitals:   03/21/22 2017 03/22/22 0256 03/22/22 0525 03/22/22 1315  BP: 94/75 120/80 99/74 114/80   03/22/22 1946 03/23/22 0403 03/23/22 1300 03/23/22 2041  BP: 104/72 103/75 120/82 101/67   03/24/22 0604 03/24/22 1329 03/24/22 1939 03/25/22 0516  BP: 100/87 106/83 108/74 102/72    17. Tremor: can consider propanolol (10mg  TID) if BP remains stable -03/24/22 discussed with pt that BP still low-end so will hold off on this for now, mild intention tremor noted during exam    LOS: 9 days A FACE TO [place] 03/25/2022, 7:20 AM

## 2022-03-26 ENCOUNTER — Other Ambulatory Visit (HOSPITAL_COMMUNITY): Payer: Self-pay

## 2022-03-26 DIAGNOSIS — M25531 Pain in right wrist: Secondary | ICD-10-CM | POA: Insufficient documentation

## 2022-03-26 DIAGNOSIS — R251 Tremor, unspecified: Secondary | ICD-10-CM | POA: Insufficient documentation

## 2022-03-26 DIAGNOSIS — F1910 Other psychoactive substance abuse, uncomplicated: Secondary | ICD-10-CM | POA: Insufficient documentation

## 2022-03-26 DIAGNOSIS — G4700 Insomnia, unspecified: Secondary | ICD-10-CM | POA: Insufficient documentation

## 2022-03-26 DIAGNOSIS — D62 Acute posthemorrhagic anemia: Secondary | ICD-10-CM | POA: Insufficient documentation

## 2022-03-26 LAB — BASIC METABOLIC PANEL
Anion gap: 8 (ref 5–15)
BUN: 21 mg/dL (ref 8–23)
CO2: 24 mmol/L (ref 22–32)
Calcium: 9.1 mg/dL (ref 8.9–10.3)
Chloride: 105 mmol/L (ref 98–111)
Creatinine, Ser: 0.94 mg/dL (ref 0.61–1.24)
GFR, Estimated: >60 mL/min (ref >60)
Glucose, Bld: 92 mg/dL (ref 70–99)
Potassium: 4 mmol/L (ref 3.5–5.1)
Sodium: 137 mmol/L (ref 135–145)

## 2022-03-26 LAB — CBC
HCT: 31.8 % — ABNORMAL LOW (ref 39.0–52.0)
Hemoglobin: 10 g/dL — ABNORMAL LOW (ref 13.0–17.0)
MCH: 28.2 pg (ref 26.0–34.0)
MCHC: 31.4 g/dL (ref 30.0–36.0)
MCV: 89.8 fL (ref 80.0–100.0)
Platelets: 230 10*3/uL (ref 150–400)
RBC: 3.54 MIL/uL — ABNORMAL LOW (ref 4.22–5.81)
RDW: 18.5 % — ABNORMAL HIGH (ref 11.5–15.5)
WBC: 8.6 10*3/uL (ref 4.0–10.5)
nRBC: 0 % (ref 0.0–0.2)

## 2022-03-26 MED ORDER — DICLOFENAC SODIUM 1 % EX GEL
4.0000 g | Freq: Four times a day (QID) | CUTANEOUS | 0 refills | Status: DC
  Filled 2022-03-26: qty 400, 25d supply, fill #0

## 2022-03-26 MED ORDER — SENNOSIDES-DOCUSATE SODIUM 8.6-50 MG PO TABS
2.0000 | ORAL_TABLET | Freq: Every day | ORAL | 0 refills | Status: DC
  Filled 2022-03-26: qty 60, 30d supply, fill #0

## 2022-03-26 MED ORDER — TRAZODONE HCL 50 MG PO TABS
25.0000 mg | ORAL_TABLET | Freq: Every evening | ORAL | 0 refills | Status: DC | PRN
  Filled 2022-03-26: qty 15, 15d supply, fill #0

## 2022-03-26 MED ORDER — VITAMIN D3 25 MCG PO TABS
1000.0000 [IU] | ORAL_TABLET | Freq: Every day | ORAL | 0 refills | Status: DC
  Filled 2022-03-26: qty 30, 30d supply, fill #0

## 2022-03-26 MED ORDER — DIVALPROEX SODIUM 250 MG PO DR TAB
250.0000 mg | DELAYED_RELEASE_TABLET | Freq: Two times a day (BID) | ORAL | 0 refills | Status: DC
  Filled 2022-03-26: qty 60, 30d supply, fill #0

## 2022-03-26 MED ORDER — FOLIC ACID 1 MG PO TABS
1.0000 mg | ORAL_TABLET | Freq: Every day | ORAL | 0 refills | Status: DC
  Filled 2022-03-26: qty 30, 30d supply, fill #0

## 2022-03-26 MED ORDER — TAMSULOSIN HCL 0.4 MG PO CAPS
0.4000 mg | ORAL_CAPSULE | Freq: Every day | ORAL | 0 refills | Status: DC
  Filled 2022-03-26: qty 30, 30d supply, fill #0

## 2022-03-26 MED ORDER — HYDROCERIN EX CREA
1.0000 | TOPICAL_CREAM | Freq: Two times a day (BID) | CUTANEOUS | 0 refills | Status: DC
  Filled 2022-03-26: qty 454, 57d supply, fill #0

## 2022-03-26 MED ORDER — DIVALPROEX SODIUM 250 MG PO DR TAB
250.0000 mg | DELAYED_RELEASE_TABLET | Freq: Two times a day (BID) | ORAL | Status: DC
  Administered 2022-03-26 – 2022-03-27 (×2): 250 mg via ORAL
  Filled 2022-03-26 (×2): qty 1

## 2022-03-26 MED ORDER — FAMOTIDINE 20 MG PO TABS
20.0000 mg | ORAL_TABLET | Freq: Two times a day (BID) | ORAL | 0 refills | Status: DC
  Filled 2022-03-26: qty 53, 27d supply, fill #0

## 2022-03-26 MED ORDER — POLYETHYLENE GLYCOL 3350 17 GM/SCOOP PO POWD
17.0000 g | Freq: Every day | ORAL | 0 refills | Status: AC
  Filled 2022-03-26: qty 238, 14d supply, fill #0

## 2022-03-26 MED ORDER — PROPRANOLOL HCL 10 MG PO TABS
10.0000 mg | ORAL_TABLET | Freq: Every day | ORAL | 0 refills | Status: DC
  Filled 2022-03-26: qty 30, 30d supply, fill #0

## 2022-03-26 MED ORDER — METHOCARBAMOL 500 MG PO TABS
500.0000 mg | ORAL_TABLET | Freq: Three times a day (TID) | ORAL | 0 refills | Status: DC | PRN
  Filled 2022-03-26: qty 45, 15d supply, fill #0

## 2022-03-26 MED ORDER — ADULT MULTIVITAMIN W/MINERALS CH
1.0000 | ORAL_TABLET | Freq: Every day | ORAL | 0 refills | Status: DC
  Filled 2022-03-26: qty 30, 30d supply, fill #0

## 2022-03-26 MED ORDER — IBUPROFEN 400 MG PO TABS
400.0000 mg | ORAL_TABLET | Freq: Three times a day (TID) | ORAL | 0 refills | Status: DC | PRN
  Filled 2022-03-26: qty 30, 10d supply, fill #0

## 2022-03-26 MED ORDER — QUETIAPINE FUMARATE 200 MG PO TABS
200.0000 mg | ORAL_TABLET | Freq: Every day | ORAL | 0 refills | Status: DC
  Filled 2022-03-26: qty 30, 30d supply, fill #0

## 2022-03-26 MED ORDER — MAGNESIUM OXIDE 400 MG PO TABS
250.0000 mg | ORAL_TABLET | Freq: Every day | ORAL | 0 refills | Status: DC
  Filled 2022-03-26: qty 15, 30d supply, fill #0

## 2022-03-26 MED ORDER — METHOCARBAMOL 500 MG PO TABS
500.0000 mg | ORAL_TABLET | Freq: Three times a day (TID) | ORAL | Status: DC | PRN
  Administered 2022-03-26 – 2022-03-27 (×2): 500 mg via ORAL
  Filled 2022-03-26 (×3): qty 1

## 2022-03-26 MED ORDER — ACETAMINOPHEN 325 MG PO TABS
325.0000 mg | ORAL_TABLET | ORAL | 0 refills | Status: DC | PRN
  Filled 2022-03-26: qty 100, 9d supply, fill #0

## 2022-03-26 MED ORDER — PROPRANOLOL HCL 20 MG PO TABS
10.0000 mg | ORAL_TABLET | Freq: Every day | ORAL | Status: DC
  Administered 2022-03-26 – 2022-03-27 (×2): 10 mg via ORAL
  Filled 2022-03-26 (×2): qty 1

## 2022-03-26 MED ORDER — THIAMINE HCL 100 MG PO TABS
100.0000 mg | ORAL_TABLET | Freq: Every day | ORAL | 0 refills | Status: DC
  Filled 2022-03-26: qty 30, 30d supply, fill #0

## 2022-03-26 NOTE — Progress Notes (Addendum)
Patient ID: Bill Brooks., male   DOB: 11-29-1960, 62 y.o.   MRN: [id number]  Patient referral sent to [facility] for the week, [facility]. Sw will provide patient and family with contact information for [employer] to confirm order and make co-payments.

## 2022-03-26 NOTE — Plan of Care (Signed)
  Problem: RH Wheelchair Mobility Goal: LTG Patient will propel w/c in controlled environment (PT) Description: LTG: Patient will propel wheelchair in controlled environment, # of feet with assist (PT) Outcome: Not Applicable Flowsheets (Taken 03/26/2022 0710) LTG: Pt will propel w/c in controlled environ  assist needed:: (D/C) -- Note: D/C Goal: LTG Patient will propel w/c in home environment (PT) Description: LTG: Patient will propel wheelchair in home environment, # of feet with assistance (PT). Outcome: Not Applicable Flowsheets (Taken 03/26/2022 0710) LTG: Pt will propel w/c in home environ  assist needed:: (D/C) -- Note: D/C

## 2022-03-26 NOTE — Progress Notes (Signed)
Speech Language Pathology Daily Session Note  Patient Details  Name: Jacai Kipp. MRN: [id number] Date of Birth: [birth year]  Today's Date: 03/26/2022 SLP Individual Time: 2330-0762 SLP Individual Time Calculation (min): 40 min  Short Term Goals: Week 1: SLP Short Term Goal 1 (Week 1): Patient will demonstrate functional problem solving for mildly complex tasks with Min verbal and visual cues. SLP Short Term Goal 2 (Week 1): Patient will recall new, daily information with use of memory compensatory strategies with Min verbal and visual cues. SLP Short Term Goal 3 (Week 1): Patient will demonstrate emergent awareness of errors during functional and familiar tasks with Min verbal cues.  Skilled Therapeutic Interventions: Skilled treatment session focused on completion of family education with the patient and his sister. SLP facilitated session by providing education regarding patient's current cognitive deficits and strategies to utilize at home to maximize recall and overall safety. SLP also provided education regarding hot to encourage living a healthy brain lifestyle. Both verbalized understanding of all information and handouts were also given. Patient left upright in wheelchair with alarm on, all needs within reach, and sister present. Continue with current plan of care.      Pain No/Denies Pain   Therapy/Group: Individual Therapy  Lajada Janes 03/26/2022, 12:38 PM

## 2022-03-26 NOTE — Progress Notes (Incomplete)
Inpatient Rehabilitation Care Coordinator Discharge Note   Patient Details  Name: Bill Brooks. MRN: [id number] Date of Birth: Jul 12, 1960   Discharge location: Home with sister  Length of Stay: 11 Days  Discharge activity level: Sup/Cga  Home/community participation: Sister and other family members.  Patient response DJ:TTSVXB Literacy - How often do you need to have someone help you when you read instructions, pamphlets, or other written material from your doctor or pharmacy?: Always  Patient response LT:JQZESP Isolation - How often do you feel lonely or isolated from those around you?: Never  Services provided included: SW, Pharmacy, TR, CM, RN, SLP, OT, PT, MD, RD  Financial Services:  Financial Services Utilized: Other (Comment) (Self-pay)    Choices offered to/list presented to: Patient and sister  Follow-up services arranged:  Other (Comment) (HEP)           Patient response to transportation need: Is the patient able to respond to transportation needs?: Yes In the past 12 months, has lack of transportation kept you from medical appointments or from getting medications?: No In the past 12 months, has lack of transportation kept you from meetings, work, or from getting things needed for daily living?: No    Comments (or additional information):  Patient/Family verbalized understanding of follow-up arrangements:  Yes  Individual responsible for coordination of the follow-up plan: Patient or sister [phone]  Confirmed correct DME delivered: Dyanne Iha 03/26/2022    Dyanne Iha

## 2022-03-26 NOTE — Progress Notes (Signed)
Physical Therapy TBI Note  Patient Details  Name: Bill Brooks. MRN: [id number] Date of Birth: 1961/03/01  Today's Date: 03/26/2022 PT Individual Time: 1000-1040 and 1415-1456 PT Individual Time Calculation (min): 40 min and 41 min  Short Term Goals: Week 1:  PT Short Term Goal 1 (Week 1): Pt will transfer sup to sit w/ CGA PT Short Term Goal 2 (Week 1): Pt will transfer sit to stand w/ min A from low heights. PT Short Term Goal 3 (Week 1): Pt will amb w/ RW and CGA x 75'  Skilled Therapeutic Interventions/Progress Updates:   Treatment Session 1 Received pt sitting in [facility] with sister present for family education training - handoff from OT. Pt agreeable to PT treatment and reported pain 3/10 in bilateral wrists. Session with emphasis on discharge planning, functional mobility/transfers, generalized strengthening and endurance, simulated car transfers, and gait training. Pt performed all transfers with RW and supervision throughout session. Pt ambulated 374ft with RW and supervision to ortho gym and performed ambulatory simulated car transfer with RW and supervision. Pt then ambulated 26ft on uneven surfaces (ramp) with RW and supervision. Pt able to stand and pick up tissue box without AD and CGA. Pt ambulated additional 144ft with RW and supervision to main therapy gym and navigated 12 6in steps with bilateral handrails and supervision ascending and descending with a step through pattern. Pt ambulated 184ft with RW and supervision back to room and went through sensation, MMT, and pain interference questionnaire. Confirmed with CSW that pt will receive RW and discussed discharge on 1/24 - pt's sister plans on hiring assist to stay home with pt while she is at appointments. Concluded session with pt sitting EOB, needs within reach, and bed alarm on.   Treatment Session 2 Received pt semi-reclined in bed, pt agreeable to PT treatment, and reported continued bilateral wrist pain (premedicated). CSW  arrived and discussed moving D/C date up to tomorrow evening after therapy since family education with sister went so well this morning. Session with emphasis on functional mobility/transfers, generalized strengthening and endurance, dynamic standing balance/coordination, and gait training. Pt transferred supine<>sitting EOB with HOB elevated and supervision/mod I. Pt performed all transfers with RW and supervision throughout session. Pt ambulated 131ft x 2 trials with RW and supervision to/from dayroom and performed BUE/LE strengthening on Nustep at workload 5 for 9 minutes for a total of 403 steps with emphasis on cardiovascular endurance.   Provided pt with HEP and educated on frequency/duration/technique for the following exercises: Exercises - Supine Bridge  - 1 x daily - 7 x weekly - 3 sets - 10 reps - Standing March with Counter Support  - 1 x daily - 7 x weekly - 3 sets - 10 reps - Standing Hip Abduction with Unilateral Counter Support  - 1 x daily - 7 x weekly - 3 sets - 10 reps - Standing Hip Extension with Unilateral Counter Support  - 1 x daily - 7 x weekly - 3 sets - 10 reps - Standing Knee Flexion with Unilateral Counter Support  - 1 x daily - 7 x weekly - 3 sets - 10 reps - Heel Toe Raises with Unilateral Counter Support  - 1 x daily - 7 x weekly - 3 sets - 10 reps - Mini Squat  - 1 x daily - 7 x weekly - 3 sets - 10 reps - Seated Hip Abduction with Resistance  - 1 x daily - 7 x weekly - 3 sets - 10 reps  Pt then stood without AD and CGA and ambulated 362ft without AD and CGA with 2 episodes of LOB due to too narrow BOS, requiring min A to correct. Pt reported his back felt "tight" after ambulating - performed seated lateral trunk flexion 3x10-15 seconds bilaterally and reported relief. Returned to room and concluded session with pt sitting EOB, needs within reach, and bed alarm on.   Therapy Documentation Precautions:  Precautions Precautions: Fall Restrictions Weight Bearing  Restrictions: Yes RUE Weight Bearing: Weight bearing as tolerated RUE Partial Weight Bearing Percentage or Pounds: RUE now WBAT LUE Weight Bearing: Weight bearing as tolerated Other Position/Activity Restrictions: No ROM restrictions  Agitated Behavior Scale: TBI Observation Details Observation Environment: CIR Start of observation period - Date: 03/26/22 Start of observation period - Time: 1000 End of observation period - Date: 03/26/22 End of observation period - Time: 1045 Agitated Behavior Scale (DO NOT LEAVE BLANKS) Short attention span, easy distractibility, inability to concentrate: Absent Impulsive, impatient, low tolerance for pain or frustration: Absent Uncooperative, resistant to care, demanding: Absent Violent and/or threatening violence toward people or property: Absent Explosive and/or unpredictable anger: Absent Rocking, rubbing, moaning, or other self-stimulating behavior: Absent Pulling at tubes, restraints, etc.: Absent Wandering from treatment areas: Absent Restlessness, pacing, excessive movement: Absent Repetitive behaviors, motor, and/or verbal: Absent Rapid, loud, or excessive talking: Absent Sudden changes of mood: Absent Easily initiated or excessive crying and/or laughter: Absent Self-abusiveness, physical and/or verbal: Absent Agitated behavior scale total score: 14  Therapy/Group: Individual Therapy Alfonse Alpers PT, DPT  03/26/2022, 7:01 AM

## 2022-03-26 NOTE — Progress Notes (Signed)
Occupational Therapy Discharge Summary  Patient Details  Name: Bill Brooks. MRN: [id number] Date of Birth: 04-Mar-1961  Date of Discharge from [facility] service:March 27, 2022  Patient has met 11 of 12 long term goals due to improved activity tolerance, improved balance, ability to compensate for deficits, and improved coordination.  Patient to discharge at overall Supervision level.  Patient's care partner is independent to provide the necessary physical and cognitive assistance at discharge. Pt's sister has attended family education regarding functional transfers and ADL recommendations and has verbalized her readiness to assist pt at his CLOF as pt plans to live with her upon discharge.  Reasons goals not met: UB dressing goal not met at supervision level vs min A due to main, contractures of the BUE at the shoulders, elbows and digits, requiring increased assist to manage  Recommendation:  Patient will benefit from ongoing skilled OT services in home health setting to continue to advance functional skills in the area of BADL and Reduce care partner burden.  Equipment: TTB and BSC  Reasons for discharge: treatment goals met  Patient/family agrees with progress made and goals achieved: Yes  OT Discharge Precautions/Restrictions  Precautions Precautions: Fall Restrictions Weight Bearing Restrictions: No ADL ADL Eating: Modified independent Where Assessed-Eating: Edge of bed Grooming: Modified independent Where Assessed-Grooming: Sitting at sink Upper Body Bathing: Supervision/safety Where Assessed-Upper Body Bathing: Shower Lower Body Bathing: Supervision/safety Where Assessed-Lower Body Bathing: Shower Upper Body Dressing: Minimal assistance Where Assessed-Upper Body Dressing: Edge of bed Lower Body Dressing: Supervision/safety Where Assessed-Lower Body Dressing: Edge of bed Toileting: Supervision/safety Where Assessed-Toileting: Glass blower/designer: Close  supervision Toilet Transfer Method: Human resources officer: Close supervison Clinical cytogeneticist Method: Ambulating, Sit pivot Tub/Shower Equipment: Radio broadcast assistant, Energy manager: Close supervision Social research officer, government Method: Heritage manager: Radio broadcast assistant, Grab bars Vision Baseline Vision/History: 1 Wears glasses (readers) Patient Visual Report: No change from baseline Vision Assessment?: No apparent visual deficits Perception  Perception: Within Functional Limits Praxis Praxis: Intact Cognition Cognition Overall Cognitive Status: Impaired/Different from baseline Arousal/Alertness: Lethargic Orientation Level: Person;Place;Situation Person: Oriented Place: Oriented Situation: Oriented Memory: Impaired Awareness: Appears intact Problem Solving: Impaired Safety/Judgment: Impaired Brief Interview for Mental Status (BIMS) Repetition of Three Words (First Attempt): 3 Temporal Orientation: Year: Correct Temporal Orientation: Month: Missed by more than 1 month Temporal Orientation: Day: Correct Recall: "Sock": Yes, no cue required Recall: "Blue": Yes, no cue required Recall: "Bed": Yes, no cue required BIMS Summary Score: 13 Sensation Sensation Light Touch: Appears Intact Coordination Gross Motor Movements are Fluid and Coordinated: No Fine Motor Movements are Fluid and Coordinated: No Finger Nose Finger Test: able to complete but limited shoulder ROM and bilateral tremors present Motor  Motor Motor: Within Functional Limits Mobility  Bed Mobility Bed Mobility: Rolling Right;Rolling Left;Sit to Supine;Supine to Sit Rolling Right: Independent with assistive device Rolling Left: Independent with assistive device Supine to Sit: Independent with assistive device Sit to Supine: Independent with assistive device Transfers Sit to Stand: Supervision/Verbal cueing Stand to Sit: Supervision/Verbal cueing   Trunk/Postural Assessment  Cervical Assessment Cervical Assessment: Exceptions to [facility] (forward head) Thoracic Assessment Thoracic Assessment: Exceptions to [facility] (thoracic rounding) Lumbar Assessment Lumbar Assessment: Exceptions to [facility] (posterior pelvic tilt) Postural Control Postural Control: Within Functional Limits  Balance Balance Balance Assessed: Yes Static Sitting Balance Static Sitting - Balance Support: Feet supported;No upper extremity supported Static Sitting - Level of Assistance: 7: Independent Dynamic Sitting Balance Dynamic Sitting - Balance Support: Feet supported;No upper extremity  supported Dynamic Sitting - Level of Assistance: 7: Independent Static Standing Balance Static Standing - Balance Support: Bilateral upper extremity supported;During functional activity Static Standing - Level of Assistance: 5: Stand by assistance (supervision) Dynamic Standing Balance Dynamic Standing - Balance Support: Bilateral upper extremity supported;During functional activity Dynamic Standing - Level of Assistance: 5: Stand by assistance (supervision) Dynamic Standing - Comments: with transfers and gait Extremity/Trunk Assessment RUE Assessment RUE Assessment: Exceptions to [facility] General Strength Comments: R UE shoulder FF greater than his L UE. Possible contractues at elbows given ROM deficits and bony end feel in lacking 20 degrees of extension RUE AROM (degrees) Right Shoulder Flexion: 90 Degrees RUE Strength RUE Overall Strength: Deficits RUE Overall Strength Comments: grossly 3-/5 Right Shoulder Flexion: 3-/5 Right Hand Grip (lbs): poor grip strength LUE Assessment LUE Assessment: Exceptions to [facility] Strength Comments: L UE very limited shoulder FF, very poor grip, unable to open deodorant, lafcking extension on elbow LUE AROM (degrees) Overall AROM Left Upper Extremity: Deficits LUE Overall AROM Comments: 3-/5 overall Left Shoulder Flexion: 80 Degrees LUE  Strength LUE Overall Strength: Deficits LUE Overall Strength Comments: 2+/5 to 3-/5 Left Shoulder Flexion: 3-/5   Blase Mess, MS, OTR/L  03/27/2022, 3:37 PM
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
## 2022-03-26 NOTE — Progress Notes (Signed)
Occupational Therapy TBI Note  Patient Details  Name: Bill Brooks. MRN: [id number] Date of Birth: 19-Oct-1960  Today's Date: 03/26/2022 OT Individual Time: 0905-1000 OT Individual Time Calculation (min): 55 min    Short Term Goals: Week 1:  OT Short Term Goal 1 (Week 1): Patient will be given built up handles to increase independence with self-feeding and grooming tasks. OT Short Term Goal 2 (Week 1): Patient will tolerate standing at the sink for 2 minutes in preparation for BADL tasks. OT Short Term Goal 3 (Week 1): Patient will demonstrate improved grip strength by opening deodorant lid.  Skilled Therapeutic Interventions/Progress Updates:  Skilled OT intervention completed with focus on ADL retraining with education on modification/adaptations, functional transfers, brief family ed regarding DME. Pt received supine in bed, asleep. Required environmental stimuli to waken, then pt agreeable to session. Un-rated pain reported in bilateral wrists, nursing present to administer meds. OT offered rest breaks, repositioning and distraction for pain reduction throughout.   Pt's sister not present at start of session, with assist needed to help pt to call her to ensure her attendance. Sister stated she was on the way. Pt agreeable to get dressed and ready for family ed. Transitioned to EOB with supervision. Doffed grip socks with figure 4 position with supervision, threaded pants with supervision at the sit > stand level without AD. Donned L sock with supervision without AE this session however min A needed for R sock! Then shoes with min A.  Supervision sit > stand using RW, then supervision ambulatory transfer to w/c. Pt demonstrated inability to lift his BUE to access his head due to proximal weakness/contractures. Education provided on modified strategy for combing hair by putting elbows on sink/counter and bending head forward then pt able to do so with supervision. Oral care with mod I  seated.  Completed 1 lap around the nurses station with close supervision using RW, with improved upright posture and less forward lean on RW.  Back in room, pt's sister attended the last 5 mins of session with OT reviewing BSC recommendation for night time toileting for fall prevention, as well as TTB for tub/shower transfer with sister verbalizing her understanding about each of these as she is a CNA, however was unable to participate in supervising pt with these transfers due to OT time constraint.  Pt remained seated in w/c, with next PT present at direct care hand off.   Therapy Documentation Precautions:  Precautions Precautions: Fall Restrictions Weight Bearing Restrictions: No RUE Weight Bearing: Weight bearing as tolerated RUE Partial Weight Bearing Percentage or Pounds: RUE now WBAT LUE Weight Bearing: Weight bearing as tolerated Other Position/Activity Restrictions: No ROM restrictions  Agitated Behavior Scale: TBI Observation Details Observation Environment: CIR Start of observation period - Date: 03/26/22 Start of observation period - Time: 0905 End of observation period - Date: 03/26/22 End of observation period - Time: 1000 Agitated Behavior Scale (DO NOT LEAVE BLANKS) Short attention span, easy distractibility, inability to concentrate: Absent Impulsive, impatient, low tolerance for pain or frustration: Absent Uncooperative, resistant to care, demanding: Absent Violent and/or threatening violence toward people or property: Absent Explosive and/or unpredictable anger: Absent Rocking, rubbing, moaning, or other self-stimulating behavior: Absent Pulling at tubes, restraints, etc.: Absent Wandering from treatment areas: Absent Restlessness, pacing, excessive movement: Absent Repetitive behaviors, motor, and/or verbal: Absent Rapid, loud, or excessive talking: Absent Sudden changes of mood: Absent Easily initiated or excessive crying and/or laughter:  Absent Self-abusiveness, physical and/or verbal: Absent Agitated behavior scale total  score: 14    Therapy/Group: Individual Therapy  Blase Mess, MS, OTR/L  03/26/2022, 12:04 PM

## 2022-03-26 NOTE — Progress Notes (Signed)
Patient ID: Bill Brooks., male   DOB: [birth year], 62 y.o.   MRN: [id number]  New Patient Visit with [name] Thursday June 14, 2022 1:00 PM EST  [facility] at [facility] [address] [place] [address] [phone]

## 2022-03-26 NOTE — Progress Notes (Signed)
Bill Brooks ID: Bill Kleckner., male   DOB: 1960-03-08, 62 y.o.   MRN: [id number]  Bill Brooks [place] entered   Card ID: [id number] Effective: 1/22 Term: 1/29

## 2022-03-26 NOTE — Progress Notes (Signed)
Patient ID: [name], male   DOB: Jun 18, 1960, 62 y.o.   MRN: [id number]  Sw met with patient and sister to provide contact information for [employer]. RW has been delivered. Sister will make co payment for [facility] and TTB. Sister will FU on Medicaid status. No additional questions or concerns.

## 2022-03-26 NOTE — Progress Notes (Incomplete)
Inpatient Rehabilitation Discharge Medication Review by a Pharmacist  A complete drug regimen review was completed for this patient to identify any potential clinically significant medication issues.  High Risk Drug Classes Is patient taking? Indication by Medication  Antipsychotic Yes Seroquel - agitation  Anticoagulant Yes   Antibiotic No   Opioid No   Antiplatelet No   Hypoglycemics/insulin No   Vasoactive Medication No Propranolol - tremor  Chemotherapy No   Other Yes Folic acid/thiamine - Wernicke's  Flomax - BPH Famotidine - reflux Depakote - Seizure Vit D, mag gluc, MVI - supplementation Trazodone - sleep Ibuprofen,Voltaren gel - pain Klonopin - anxiety Robaxin - spasms Miralax, Senokot-S - constipation     Type of Medication Issue Identified Description of Issue Recommendation(s)  Drug Interaction(s) (clinically significant)     Duplicate Therapy     Allergy     No Medication Administration End Date     Incorrect Dose     Additional Drug Therapy Needed     Significant med changes from prior encounter (inform family/care partners about these prior to discharge). No medications PTA   Other       Clinically significant medication issues were identified that warrant physician communication and completion of prescribed/recommended actions by midnight of the next day:  No  Name of provider notified for urgent issues identified:   Provider Method of Notification:     Pharmacist comments:   Time spent performing this drug regimen review (minutes):  20

## 2022-03-26 NOTE — Progress Notes (Signed)
PROGRESS NOTE   Subjective/Complaints: He would like to try medication for tremor which can be very bothersome at times.  He has urinary urgency He asks about whether he needs other medications at home  ROS: + tremor   Objective:   No results found. Recent Labs    03/26/22 0556  WBC 8.6  HGB 10.0*  HCT 31.8*  PLT 230   Recent Labs    03/26/22 0556  NA 137  K 4.0  CL 105  CO2 24  GLUCOSE 92  BUN 21  CREATININE 0.94  CALCIUM 9.1    Intake/Output Summary (Last 24 hours) at 03/26/2022 1008 Last data filed at 03/25/2022 1808 Gross per 24 hour  Intake 480 ml  Output --  Net 480 ml        Physical Exam: Vital Signs Blood pressure 99/70, pulse 80, temperature 98.5 F (36.9 C), temperature source Oral, resp. rate 16, height 6\' 1"  (1.854 m), weight 72.2 kg, SpO2 100 %.  General: No acute distress, BMI 21 Mood and affect are appropriate Heart: Regular rate and rhythm no rubs murmurs or extra sounds Lungs: Clear to auscultation, breathing unlabored, no rales or wheezes Abdomen: Positive bowel sounds, soft nontender to palpation, nondistended Extremities: No clubbing, cyanosis. Mild nonpitting localized swelling of bilateral wrists. ROM reduced in bilateral wrist extension. No TTP.  Skin: No evidence of breakdown, no evidence of rash Neurologic: Cranial nerves II through XII intact,  BL UE intention tremor on FTN testing. Reslexes 2+ BL UE.  Psych: intermittent frustration but agitation much improved Musculoskeletal: motor strength is 5/5 in bilateral UE and Les throughout. Bilateral wrists TTP Ambulating RW with CG  Assessment/Plan: 1. Functional deficits which require 3+ hours per day of interdisciplinary therapy in a comprehensive inpatient rehab setting. Physiatrist is providing close team supervision and 24 hour management of active medical problems listed below. Physiatrist and rehab team continue to  assess barriers to discharge/monitor patient progress toward functional and medical goals  Care Tool:  Bathing    Body parts bathed by patient: Chest, Abdomen, Front perineal area, Right upper leg, Left upper leg, Face   Body parts bathed by helper: Left lower leg, Right lower leg, Buttocks, Left arm, Right arm     Bathing assist Assist Level: Maximal Assistance - Patient 24 - 49%     Upper Body Dressing/Undressing Upper body dressing   What is the patient wearing?: Pull over shirt    Upper body assist Assist Level: Minimal Assistance - Patient > 75%    Lower Body Dressing/Undressing Lower body dressing      What is the patient wearing?: Pants     Lower body assist Assist for lower body dressing: Maximal Assistance - Patient 25 - 49%     Toileting Toileting    Toileting assist Assist for toileting: Moderate Assistance - Patient 50 - 74%     Transfers Chair/bed transfer  Transfers assist     Chair/bed transfer assist level: Contact Guard/Touching assist     Locomotion Ambulation   Ambulation assist      Assist level: Supervision/Verbal cueing Assistive device: Walker-rolling Max distance: 140ft   Walk 10 feet activity  Assist     Assist level: Supervision/Verbal cueing Assistive device: Walker-rolling   Walk 50 feet activity   Assist Walk 50 feet with 2 turns activity did not occur: Safety/medical concerns  Assist level: Supervision/Verbal cueing Assistive device: Walker-rolling    Walk 150 feet activity   Assist Walk 150 feet activity did not occur: Safety/medical concerns  Assist level: Supervision/Verbal cueing Assistive device: Walker-rolling    Walk 10 feet on uneven surface  activity   Assist     Assist level: Contact Guard/Touching assist Assistive device: Walker-rolling   Wheelchair     Assist Is the patient using a wheelchair?: Yes Type of Wheelchair: Manual    Wheelchair assist level: Contact  Guard/Touching assist Max wheelchair distance: 20    Wheelchair 50 feet with 2 turns activity    Assist        Assist Level: Maximal Assistance - Patient 25 - 49%   Wheelchair 150 feet activity     Assist      Assist Level: Total Assistance - Patient < 25%   Blood pressure 99/70, pulse 80, temperature 98.5 F (36.9 C), temperature source Oral, resp. rate 16, height 6\' 1"  (1.854 m), weight 72.2 kg, SpO2 100 %.  Medical Problem List and Plan: 1. 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  Impaired mobility: continue Lovenox, SCDs ordered             -antiplatelet therapy: N/A 3. Low back pain: Will schedule tylenol qid --continue advil prn. kpad ordered.    4. Mood/Behavior/Sleep: LCSW to follow for evaluation and support.             --Continue Seroquel at nights for sleep/agitation. Sleep wake chart.              -antipsychotic agents:  Seroquel. 5. Neuropsych/cognition: This patient is capable of making decisions on his own behalf. 6. Skin/Wound Care: Routine pressure relief measures.              --continue protein supplements.  7. Fluids/Electrolytes/Nutrition: Monitor I/O. Check CMET in am 8. Acute on chronic SDH:  has resolved and has been seizure free.             --agitation/delirium resolved? D/c klonopin. ---Continue Seroquel 200 mg/HS (decreased 01/080 9. Tachycardia: resolved, d/c lopressor 10. VDRF s/p trach/rib Fx, R-PTX: Respiratory status stable.  11.  Urinary retention: restart flomax since hypotension improved 12.  ABLA: Discussed improvement in Hgb 1/16.  13. H/o polysubstance abuse including THC, cocaine, and EOTH: continue Thiamine daily              --Vitamin B1, Vitamin  B12 and Vitamin D levels WNL.             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 Hyperglycemia: Hgb A1c-5.6 and off tube feeds.  Will discontinue CBG checks. Start magnesium gluconate 250mg  HS 15. Bilateral wrist pain:  voltaren ordered, advised patient he can apply this at home up to 4 times per day 16. Hypotension: d/c klonpin, decrease robaxin to 500mg  q8H prn 17. Tremor: start propanolol 10mg  daily   LOS: 10 days A FACE TO FACE EVALUATION WAS PERFORMED  Bill Brooks P Atsushi Yom 03/26/2022, 10:08 AM

## 2022-03-26 NOTE — Progress Notes (Signed)
Physical Therapy Discharge Summary  Patient Details  Name: Bill Brooks. MRN: [id number] Date of Birth: 1960-08-25  Date of Discharge from PT service:March 27, 2021  Patient has met 11 of 11 long term goals due to improved activity tolerance, improved balance, improved postural control, increased strength, decreased pain, ability to compensate for deficits, improved awareness, and improved coordination. Patient to discharge at an ambulatory level Supervision using RW. Patient's care partner is independent to provide the necessary physical and cognitive assistance at discharge. Pt's sister attended family education training on 1/22 and verbalized and demonstrated confidence with all tasks to ensure safe discharge home.   All goals met   Recommendation:  Patient will benefit from ongoing skilled PT services in home health setting to continue to advance safe functional mobility, address ongoing impairments in transfers, generalized strengthening and endurance, dynamic standing balance/coordination, gait training, and to minimize fall risk.  Equipment: RW  Reasons for discharge: treatment goals met and discharge from hospital  Patient/family agrees with progress made and goals achieved: Yes  PT Discharge Precautions/Restrictions Precautions Precautions: Fall Restrictions Weight Bearing Restrictions: No Pain Interference Pain Interference Pain Effect on Sleep: 2. Occasionally Pain Interference with Therapy Activities: 2. Occasionally Pain Interference with Day-to-Day Activities: 1. Rarely or not at all Cognition Overall Cognitive Status: Impaired/Different from baseline Arousal/Alertness: Awake/alert Orientation Level: Oriented X4 Memory: Impaired Awareness: Appears intact Problem Solving: Impaired Safety/Judgment: Impaired Sensation Sensation Light Touch: Appears Intact Proprioception: Appears Intact Coordination Gross Motor Movements are Fluid and Coordinated: Yes Fine  Motor Movements are Fluid and Coordinated: No Finger Nose Finger Test: able to complete but limited shoulder ROM and bilateral tremors present Heel Shin Test: WFL bilaterally but slow Motor  Motor Motor: Within Functional Limits  Mobility Bed Mobility Bed Mobility: Rolling Right;Rolling Left;Sit to Supine;Supine to Sit Rolling Right: Independent with assistive device Rolling Left: Independent with assistive device Supine to Sit: Independent with assistive device Sit to Supine: Independent with assistive device Transfers Transfers: Sit to Stand;Stand to Sit;Stand Pivot Transfers Sit to Stand: Supervision/Verbal cueing Stand to Sit: Supervision/Verbal cueing Stand Pivot Transfers: Supervision/Verbal cueing Stand Pivot Transfer Details (indicate cue type and reason): verbal cues for upright posture Transfer (Assistive device): Rolling walker Locomotion  Gait Ambulation: Yes Gait Assistance: Supervision/Verbal cueing Gait Distance (Feet): 300 Feet Assistive device: Rolling walker Gait Assistance Details: Verbal cues for safe use of DME/AE Gait Assistance Details: verbal cues for upright posture and RW safety Gait Gait: Yes Gait Pattern: Impaired Gait Pattern: Trunk flexed;Narrow base of support;Poor foot clearance - right;Poor foot clearance - left Gait velocity: decreased Stairs / Additional Locomotion Stairs: Yes Stairs Assistance: Supervision/Verbal cueing Stair Management Technique: Two rails Number of Stairs: 12 Height of Stairs: 6 Ramp: Supervision/Verbal cueing (RW) Pick up small object from the floor assist level: Contact Guard/Touching assist Pick up small object from the floor assistive device: tissue box without AD Wheelchair Mobility Wheelchair Mobility: No  Trunk/Postural Assessment  Cervical Assessment Cervical Assessment: Exceptions to [facility] (forward head) Thoracic Assessment Thoracic Assessment: Exceptions to [facility] (thoracic rounding) Lumbar Assessment Lumbar  Assessment: Exceptions to [facility] (posterior pelvic tilt) Postural Control Postural Control: Within Functional Limits  Balance Balance Balance Assessed: Yes Static Sitting Balance Static Sitting - Balance Support: Feet supported;No upper extremity supported Static Sitting - Level of Assistance: 7: Independent Dynamic Sitting Balance Dynamic Sitting - Balance Support: Feet supported;No upper extremity supported Dynamic Sitting - Level of Assistance: 7: Independent Static Standing Balance Static Standing - Balance Support: Bilateral upper extremity supported;During functional  activity Static Standing - Level of Assistance: 5: Stand by assistance (supervision) Dynamic Standing Balance Dynamic Standing - Balance Support: Bilateral upper extremity supported;During functional activity Dynamic Standing - Level of Assistance: 5: Stand by assistance (supervision) Dynamic Standing - Comments: with transfers and gait Extremity Assessment  RLE Assessment RLE Assessment: Within Functional Limits LLE Assessment LLE Assessment: Within [place] PT, DPT  03/26/2022, 7:07 AM

## 2022-03-27 ENCOUNTER — Other Ambulatory Visit (HOSPITAL_COMMUNITY): Payer: Self-pay

## 2022-03-27 DIAGNOSIS — D62 Acute posthemorrhagic anemia: Secondary | ICD-10-CM

## 2022-03-27 DIAGNOSIS — M25532 Pain in left wrist: Secondary | ICD-10-CM

## 2022-03-27 DIAGNOSIS — S069X9D Unspecified intracranial injury with loss of consciousness of unspecified duration, subsequent encounter: Secondary | ICD-10-CM

## 2022-03-27 DIAGNOSIS — M25531 Pain in right wrist: Secondary | ICD-10-CM

## 2022-03-27 DIAGNOSIS — G4709 Other insomnia: Secondary | ICD-10-CM

## 2022-03-27 NOTE — Progress Notes (Signed)
Inpatient Rehabilitation Discharge Medication Review by a Pharmacist  A complete drug regimen review was completed for this patient to identify any potential clinically significant medication issues.  High Risk Drug Classes Is patient taking? Indication by Medication  Antipsychotic Yes Seroquel - agitation  Anticoagulant Yes   Antibiotic No   Opioid No   Antiplatelet No   Hypoglycemics/insulin No   Vasoactive Medication No Propranolol - tremor  Chemotherapy No   Other Yes Folic acid/thiamine - Wernicke's  Flomax - BPH Famotidine - reflux Depakote - Seizure Vit D, mag oxide, [employer] - supplementation Trazodone - sleep Ibuprofen,Voltaren gel - pain Robaxin - spasms Miralax, Senokot-S - constipation Acetaminophen- pain     Type of Medication Issue Identified Description of Issue Recommendation(s)  Drug Interaction(s) (clinically significant)     Duplicate Therapy     Allergy     No Medication Administration End Date     Incorrect Dose     Additional Drug Therapy Needed     Significant med changes from prior encounter (inform family/care partners about these prior to discharge). No medications PTA   Other       Clinically significant medication issues were identified that warrant physician communication and completion of prescribed/recommended actions by midnight of the next day:  No  Name of provider notified for urgent issues identified:   Provider Method of Notification:     Pharmacist comments:   Time spent performing this drug regimen review (minutes):  15   Thank you for allowing pharmacy to be part of this patients care team. Nicole Cella, [place] Pharmacist

## 2022-03-27 NOTE — Progress Notes (Signed)
Physical Therapy TBI Note  Patient Details  Name: Bill Brooks. MRN: [id number] Date of Birth: 09-29-1960  Today's Date: 03/27/2022 PT Individual Time: 0830-0924 PT Individual Time Calculation (min): 54 min   Short Term Goals: Week 1:  PT Short Term Goal 1 (Week 1): Pt will transfer sup to sit w/ CGA PT Short Term Goal 2 (Week 1): Pt will transfer sit to stand w/ min A from low heights. PT Short Term Goal 3 (Week 1): Pt will amb w/ RW and CGA x 75' Week 2:     Skilled Therapeutic Interventions/Progress Updates:      Pt sleeping in bed with covers over his head - startled when woken and needing time to fully wake. Supine<>sitting EOB mod I. Patient reporting he is DC home today with sister - unsure of time. Focus therapy session on functional tasks to prepare for home. Pt agreeable to shower.   Patient gathered clothing from [place] without assist from a sitting position at EOB. Self selected clothes without assist. Ambulated with CGA and no AD from EOB to shower chair in bathroom. Safety cues needed to undress from sitting position as patient attempting to doff underwear and shirt in standing. Educated on falls risk. Patient completed showering at setupA level while seated on chair. Needing assist for drying off backside and again needing safety cues for donning clean underwear while sitting rather than standing.  Completed UB/LB dressing while sitting at EOB with minA for UB and no assist needed for LB.  Pt preferring to do self care grooming tasks from sitting position at the sink/mirror. All completed without assist.   Pt ended session seated in w/c with safety belt alarm on. Call bell in reach and all needs met.   Therapy Documentation Precautions:  Precautions Precautions: Fall Restrictions Weight Bearing Restrictions: Yes RUE Weight Bearing: Weight bearing as tolerated RUE Partial Weight Bearing Percentage or Pounds: RUE now WBAT LUE Weight Bearing: Weight bearing as  tolerated Other Position/Activity Restrictions: No ROM restrictions General:  Agitated Behavior Scale: TBI Observation Details Observation Environment: CIR Start of observation period - Date: 03/27/22 Start of observation period - Time: 0830 End of observation period - Date: 03/27/22 End of observation period - Time: 0924 Agitated Behavior Scale (DO NOT LEAVE BLANKS) Short attention span, easy distractibility, inability to concentrate: Absent Impulsive, impatient, low tolerance for pain or frustration: Absent Uncooperative, resistant to care, demanding: Absent Violent and/or threatening violence toward people or property: Absent Explosive and/or unpredictable anger: Absent Rocking, rubbing, moaning, or other self-stimulating behavior: Absent Pulling at tubes, restraints, etc.: Absent Wandering from treatment areas: Absent Restlessness, pacing, excessive movement: Absent Repetitive behaviors, motor, and/or verbal: Absent Rapid, loud, or excessive talking: Absent Sudden changes of mood: Absent Easily initiated or excessive crying and/or laughter: Absent Self-abusiveness, physical and/or verbal: Absent Agitated behavior scale total score: 14    Therapy/Group: Individual Therapy  Alger Simons 03/27/2022, 9:24 AM

## 2022-03-27 NOTE — Progress Notes (Signed)
Speech Language Pathology Discharge Summary  Patient Details  Name: Bill Brooks. MRN: [id number] Date of Birth: 1960-10-15  Date of Discharge from [facility] service:March 27, 2022  Today's Date: 03/27/2022 SLP Individual Time: 3790-2409 SLP Individual Time Calculation (min): 45 min  Skilled Therapeutic Interventions: Skilled ST treatment focused on cognitive goals. Pt was greeted sitting EOB on arrival and eager to discharge later this afternoon. Pt discussed current physical and cognitive impairments with good awareness and engaged in functional discussion with emphasis on anticipatory awareness with overall sup A verbal cues for safety. SLP re-administered the [facility] Mental Status Examination (SLUMS). Patient scored 22/30 points with a score of 27 or above considered within the normal range. This score is 7 points higher than obtained during initial eval on 03/20/22. Pt demonstrated improved recall, problem solving, and anticipatory awareness and currently functioning at a supervision-to-min A level for cognitive task completion. Pt ambulated to laundry room to retrieve items with sup A verbal cues for anticipation of needs. Patient was left in bed with alarm activated and immediate needs within reach at end of session. Continue per current plan of care.     Patient has met 3 of 3 long term goals.  Patient to discharge at overall Supervision level.  Reasons goals not met: NA   Clinical Impression/Discharge Summary:   Patient has made functional gains and has met 3 of 3 long-term goals this admission due to improved cognitive-linguistic skills. Patient is currently completing functional and basic level cognitive tasks with supervision A cues in regards to functional recall with use of memory compensations, basic problem solving, and anticipatory awareness; at least min support is required for more complex problem solving skills. Patient and family education is complete and patient to  discharge at overall supervision level. Patient's care partner is independent to provide the necessary physical and cognitive assistance at discharge. Patient would benefit from continued SLP services in home health setting to maximize cognitive function and functional independence.   Care Partner:  Caregiver Able to Provide Assistance: Yes  Type of Caregiver Assistance: Physical;Cognitive  Recommendation:  24 hour supervision/assistance;Home Health SLP  Rationale for SLP Follow Up: Maximize cognitive function and independence;Reduce caregiver burden   Equipment: None   Reasons for discharge: Discharged from hospital;Treatment goals met   Patient/Family Agrees with Progress Made and Goals Achieved: Yes    Patty Sermons 03/27/2022, 4:24 PM

## 2022-03-27 NOTE — Progress Notes (Signed)
Patient ID: Bill Brooks., male   DOB: Nov 06, 1960, 62 y.o.   MRN: [id number]  Pt sister will be here to pick up patient between 9a-9:30am.

## 2022-03-27 NOTE — Progress Notes (Addendum)
Patient ID: Bill Brooks., male   DOB: 1961-01-06, 62 y.o.   MRN: [id number]  [facility] approved with Alvis Lemmings PT OT SLP. Orders sent to [place].

## 2022-03-27 NOTE — Progress Notes (Signed)
PROGRESS NOTE   Subjective/Complaints: Tremor is still bothersome to him and he asks whether his propanolol can be increased further. Discussed that his BP is soft and this can lower the BP further  ROS: + tremor, decreased agitation   Objective:   No results found. Recent Labs    03/26/22 0556  WBC 8.6  HGB 10.0*  HCT 31.8*  PLT 230   Recent Labs    03/26/22 0556  NA 137  K 4.0  CL 105  CO2 24  GLUCOSE 92  BUN 21  CREATININE 0.94  CALCIUM 9.1    Intake/Output Summary (Last 24 hours) at 03/27/2022 1234 Last data filed at 03/27/2022 0900 Gross per 24 hour  Intake 720 ml  Output --  Net 720 ml        Physical Exam: Vital Signs Blood pressure 99/82, pulse 85, temperature 98.2 F (36.8 C), temperature source Oral, resp. rate 16, height 6\' 1"  (1.854 m), weight 72.2 kg, SpO2 98 %.  General: No acute distress, BMI 21 Mood and affect are appropriate Heart: Regular rate and rhythm no rubs murmurs or extra sounds Lungs: Clear to auscultation, breathing unlabored, no rales or wheezes Abdomen: Positive bowel sounds, soft nontender to palpation, nondistended Extremities: No clubbing, cyanosis. Mild nonpitting localized swelling of bilateral wrists. ROM reduced in bilateral wrist extension. No TTP.  Skin: No evidence of breakdown, no evidence of rash Neurologic: Cranial nerves II through XII intact,  BL UE intention tremor on FTN testing. Reslexes 2+ BL UE.  Psych: intermittent frustration but agitation much improved Musculoskeletal: motor strength is 5/5 in bilateral UE and Les throughout. Bilateral wrists TTP Ambulating RW with CG, independent in ADLs  Assessment/Plan: 1. Functional deficits which require 3+ hours per day of interdisciplinary therapy in a comprehensive inpatient rehab setting. Physiatrist is providing close team supervision and 24 hour management of active medical problems listed  below. Physiatrist and rehab team continue to assess barriers to discharge/monitor patient progress toward functional and medical goals  Care Tool:  Bathing    Body parts bathed by patient: Chest, Abdomen, Right upper leg, Left upper leg, Face, Left lower leg, Right lower leg, Buttocks, Front perineal area, Left arm, Right arm   Body parts bathed by helper: Left lower leg, Right lower leg, Buttocks, Left arm, Right arm     Bathing assist Assist Level: Supervision/Verbal cueing     Upper Body Dressing/Undressing Upper body dressing   What is the patient wearing?: Pull over shirt    Upper body assist Assist Level: Minimal Assistance - Patient > 75%    Lower Body Dressing/Undressing Lower body dressing      What is the patient wearing?: Underwear/pull up, Pants     Lower body assist Assist for lower body dressing: Supervision/Verbal cueing     Toileting Toileting    Toileting assist Assist for toileting: Supervision/Verbal cueing     Transfers Chair/bed transfer  Transfers assist     Chair/bed transfer assist level: Supervision/Verbal cueing     Locomotion Ambulation   Ambulation assist      Assist level: Supervision/Verbal cueing Assistive device: Walker-rolling Max distance: 364ft   Walk 10 feet activity  Assist     Assist level: Supervision/Verbal cueing Assistive device: Walker-rolling   Walk 50 feet activity   Assist Walk 50 feet with 2 turns activity did not occur: Safety/medical concerns  Assist level: Supervision/Verbal cueing Assistive device: Walker-rolling    Walk 150 feet activity   Assist Walk 150 feet activity did not occur: Safety/medical concerns  Assist level: Supervision/Verbal cueing Assistive device: Walker-rolling    Walk 10 feet on uneven surface  activity   Assist     Assist level: Supervision/Verbal cueing Assistive device: Walker-rolling   Wheelchair     Assist Is the patient using a  wheelchair?: No Type of Wheelchair: Manual Wheelchair activity did not occur: N/A  Wheelchair assist level: Contact Guard/Touching assist Max wheelchair distance: 20    Wheelchair 50 feet with 2 turns activity    Assist    Wheelchair 50 feet with 2 turns activity did not occur: N/A   Assist Level: Maximal Assistance - Patient 25 - 49%   Wheelchair 150 feet activity     Assist  Wheelchair 150 feet activity did not occur: N/A   Assist Level: Total Assistance - Patient < 25%   Blood pressure 99/82, pulse 85, temperature 98.2 F (36.8 C), temperature source Oral, resp. rate 16, height 6\' 1"  (1.854 m), weight 72.2 kg, SpO2 98 %.  Medical Problem List and Plan: 1. 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  Impaired mobility: d/c lovenox upon discharge. SCDs ordered             -antiplatelet therapy: N/A 3. Low back pain: Will schedule tylenol qid --continue advil prn. kpad ordered.    4. Mood/Behavior/Sleep: LCSW to follow for evaluation and support.             --Continue Seroquel at nights for sleep/agitation. Sleep wake chart.              -antipsychotic agents:  Seroquel. 5. Neuropsych/cognition: This patient is capable of making decisions on his own behalf. 6. Skin/Wound Care: Routine pressure relief measures.              --continue protein supplements.  7. Fluids/Electrolytes/Nutrition: Monitor I/O. Check CMET in am 8. Acute on chronic SDH:  has resolved and has been seizure free.             --agitation/delirium resolved? D/c klonopin. ---Continue Seroquel 200 mg/HS (decreased 01/080 9. Tachycardia: resolved, d/c lopressor 10. VDRF s/p trach/rib Fx, R-PTX: Respiratory status stable.  11.  Urinary  retention: restart flomax since hypotension improved 12.  ABLA: Discussed improvement in Hgb 1/16.  13. H/o polysubstance abuse including THC, cocaine, and EOTH: continue Thiamine daily              --Vitamin B1, Vitamin B12 and Vitamin D levels WNL.             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 Hyperglycemia: Hgb  A1c-5.6 and off tube feeds.  Will discontinue CBG checks. Start magnesium gluconate 250mg  HS 15. Bilateral wrist pain: voltaren ordered, advised patient he can apply this at home up to 4 times per day 16. Hypotension: d/c klonpin, decrease robaxin to 500mg  q8H prn, discussed that propanolol can also contribute to hypotension 17. Tremor: start propanolol 10mg  daily, discussed that we can increase outpatient if BP rises.    LOS: 11 days A FACE TO FACE EVALUATION WAS PERFORMED  Bill Brooks 03/27/2022, 12:34 PM

## 2022-03-27 NOTE — Plan of Care (Signed)
  Problem: RH Problem Solving Goal: LTG Patient will demonstrate problem solving for (SLP) Description: LTG:  Patient will demonstrate problem solving for basic/complex daily situations with cues  (SLP) Outcome: Completed/Met   Problem: RH Memory Goal: LTG Patient will use memory compensatory aids to (SLP) Description: LTG:  Patient will use memory compensatory aids to recall biographical/new, daily complex information with cues (SLP) Outcome: Completed/Met   Problem: RH Awareness Goal: LTG: Patient will demonstrate awareness during functional activites type of (SLP) Description: LTG: Patient will demonstrate awareness during functional activites type of (SLP) Outcome: Completed/Met

## 2022-03-27 NOTE — Progress Notes (Signed)
Inpatient Rehabilitation Care Coordinator Discharge Note   Patient Details  Name: Bill Brooks. MRN: [id number] Date of Birth: 09-13-1960   Discharge location: Home with sister  Length of Stay: 11 days  Discharge activity level: sup/cga  Home/community participation: sister and other family members  Patient response NL:GXQJJH Literacy - How often do you need to have someone help you when you read instructions, pamphlets, or other written material from your doctor or pharmacy?: Always  Patient response ER:DEYCXK Isolation - How often do you feel lonely or isolated from those around you?: Never  Services provided included: SW, Pharmacy, TR, RN, CM, SLP, OT, PT, RD, MD  Financial Services:  Financial Services Utilized: Other (Comment) (self pay)    Choices offered to/list presented to: patient and sister  Follow-up services arranged:  [place]: [facility] (pt ot slp)         Patient response to transportation need: Is the patient able to respond to transportation needs?: Yes In the past 12 months, has lack of transportation kept you from medical appointments or from getting medications?: No In the past 12 months, has lack of transportation kept you from meetings, work, or from getting things needed for daily living?: No    Comments (or additional information):  Patient/Family verbalized understanding of follow-up arrangements:  Yes  Individual responsible for coordination of the follow-up plan: patient or sister [phone]  Confirmed correct DME delivered: Dyanne Iha 03/27/2022    Dyanne Iha

## 2022-03-27 NOTE — Plan of Care (Signed)
  Problem: RH Dressing Goal: LTG Patient will perform upper body dressing (OT) Description: LTG Patient will perform upper body dressing with assist, with/without cues (OT). Outcome: Not Met (add Reason) Flowsheets (Taken 03/27/2022 1537) LTG: Pt will perform upper body dressing with assistance level of: (not met due to pain and limited ROM of the BUE requiring increased assist than anticipated) --   Problem: RH Balance Goal: LTG: Patient will maintain dynamic sitting balance (OT) Description: LTG:  Patient will maintain dynamic sitting balance with assistance during activities of daily living (OT) Outcome: Completed/Met Goal: LTG Patient will maintain dynamic standing with ADLs (OT) Description: LTG:  Patient will maintain dynamic standing balance with assist during activities of daily living (OT)  Outcome: Completed/Met   Problem: Sit to Stand Goal: LTG:  Patient will perform sit to stand in prep for activites of daily living with assistance level (OT) Description: LTG:  Patient will perform sit to stand in prep for activites of daily living with assistance level (OT) Outcome: Completed/Met   Problem: RH Eating Goal: LTG Patient will perform eating w/assist, cues/equip (OT) Description: LTG: Patient will perform eating with assist, with/without cues using equipment (OT) Outcome: Completed/Met   Problem: RH Grooming Goal: LTG Patient will perform grooming w/assist,cues/equip (OT) Description: LTG: Patient will perform grooming with assist, with/without cues using equipment (OT) Outcome: Completed/Met   Problem: RH Bathing Goal: LTG Patient will bathe all body parts with assist levels (OT) Description: LTG: Patient will bathe all body parts with assist levels (OT) Outcome: Completed/Met   Problem: RH Dressing Goal: LTG Patient will perform lower body dressing w/assist (OT) Description: LTG: Patient will perform lower body dressing with assist, with/without cues in positioning  using equipment (OT) Outcome: Completed/Met   Problem: RH Toileting Goal: LTG Patient will perform toileting task (3/3 steps) with assistance level (OT) Description: LTG: Patient will perform toileting task (3/3 steps) with assistance level (OT)  Outcome: Completed/Met   Problem: RH Functional Use of Upper Extremity Goal: LTG Patient will use RT/LT upper extremity as a (OT) Description: LTG: Patient will use right/left upper extremity as a stabilizer/gross assist/diminished/nondominant/dominant level with assist, with/without cues during functional activity (OT) Outcome: Completed/Met   Problem: RH Toilet Transfers Goal: LTG Patient will perform toilet transfers w/assist (OT) Description: LTG: Patient will perform toilet transfers with assist, with/without cues using equipment (OT) Outcome: Completed/Met   Problem: RH Tub/Shower Transfers Goal: LTG Patient will perform tub/shower transfers w/assist (OT) Description: LTG: Patient will perform tub/shower transfers with assist, with/without cues using equipment (OT) Outcome: Completed/Met

## 2022-03-27 NOTE — Progress Notes (Signed)
Occupational Therapy TBI Note  Patient Details  Name: Bill Brooks. MRN: [id number] Date of Birth: 12/13/1960  Today's Date: 03/27/2022 OT Individual Time: 1005-1100 & [id number] OT Individual Time Calculation (min): 55 min & 41 min   Short Term Goals: Week 1:  OT Short Term Goal 1 (Week 1): Patient will be given built up handles to increase independence with self-feeding and grooming tasks. OT Short Term Goal 2 (Week 1): Patient will tolerate standing at the sink for 2 minutes in preparation for BADL tasks. OT Short Term Goal 3 (Week 1): Patient will demonstrate improved grip strength by opening deodorant lid.  Skilled Therapeutic Interventions/Progress Updates:  Session 1 Skilled OT intervention completed with focus on IADL management, ambulatory endurance, pain management and self-ROM. Pt received supine, agreeable to session. Un-rated pain reported in back; nurse made aware. OT offered heat, breaks and repositioning throughout for pain reduction.  Pt requesting to start load of laundry. Completed bed mobility with supervision then from elevated bed able to stand with supervision using RW. Ambulatory transfer to laundry room with supervision, adequate gait speed and improved erect posture. Pt was able to load the washing machine and select all appropriate settings with supervision but no errors however assist needed for reaching laundry pod due to limited ROM. Discussed modifying his laundry room and areas during ADLs to increase independence within his limitations. Ambulated to table with supervision.  Per CSW, pt is unable to receive follow up therapy due to being uninsured, therefore OT issued pt an HEP for the following to promote increased ROM and functional use of BUE during functional mobility and ADLs as well as reduce pain: -Hot pack applied to bilateral shoulders/traps for total of 30 mins during exercises (no adverse skin reaction noted after removal) -Table slides- shoulder  flexion, shoulder horizontal abduction (L/R), shoulder external rotation  -Education on breathing strategies for muscle relaxation during stretching and to reduce pain -Cervical, scapular and pec region stretches -Handout issued to pt for increased recall as cues were needed for technique and form  Min A needed to stand from low chair without arms. Then ambulated with supervision back to room.  Pt remained supine in bed, with bed alarm on/activated, and with all needs in reach at end of session.  Session 2 Skilled OT intervention completed with focus on ambulatory endurance, PROM/AAROM. Pt received supine in bed, agreeable to session. No verbal pain reported.  Pt reported he walked down to get his finished laundry with nursing however left his clothes jumbled in the bag. Pt folded his clothing items for BUE AROM/endurance, then completed supervision sit > stand and ambulatory transfers using RW.  Ambulated to gym, sat EOM then supervision transition to supine with pillows placed behind head and bolster under knees for comfort.   OT completed the following mobilization/PROM for light stretching of the BUE due to limited ROM and pt completing the following AROM: PROM -scapular protraction, retraction and depression -shoulder flexion, external rotation -elbow extension AROM -shoulder flexion (RUE) AAROM -chest press (LUE)  Min A needed to transition to EOM due to pain in low back, then ambulated back to room. Pt remained seated EOB, with bed alarm on/activated, and with all needs in reach at end of session.   Therapy Documentation Precautions:  Precautions Precautions: Fall Restrictions Weight Bearing Restrictions: Yes RUE Weight Bearing: Weight bearing as tolerated RUE Partial Weight Bearing Percentage or Pounds: RUE now WBAT LUE Weight Bearing: Weight bearing as tolerated Other Position/Activity Restrictions: No ROM restrictions  Agitated Behavior Scale: TBI Observation  Details Observation Environment: CIR Start of observation period - Date: 03/27/22 Start of observation period - Time: 1005 End of observation period - Date: 03/27/22 End of observation period - Time: 1100 Agitated Behavior Scale (DO NOT LEAVE BLANKS) Short attention span, easy distractibility, inability to concentrate: Absent Impulsive, impatient, low tolerance for pain or frustration: Absent Uncooperative, resistant to care, demanding: Absent Violent and/or threatening violence toward people or property: Absent Explosive and/or unpredictable anger: Absent Rocking, rubbing, moaning, or other self-stimulating behavior: Absent Pulling at tubes, restraints, etc.: Absent Wandering from treatment areas: Absent Restlessness, pacing, excessive movement: Absent Repetitive behaviors, motor, and/or verbal: Absent Rapid, loud, or excessive talking: Absent Sudden changes of mood: Absent Easily initiated or excessive crying and/or laughter: Absent Self-abusiveness, physical and/or verbal: Absent Agitated behavior scale total score: 14     Therapy/Group: Individual Therapy  Blase Mess, MS, OTR/L  03/27/2022, 3:35 PM
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
## 2022-04-06 ENCOUNTER — Ambulatory Visit (INDEPENDENT_AMBULATORY_CARE_PROVIDER_SITE_OTHER): Payer: Medicaid Other | Admitting: Nurse Practitioner

## 2022-04-06 ENCOUNTER — Encounter: Payer: Self-pay | Admitting: Nurse Practitioner

## 2022-04-06 ENCOUNTER — Encounter
Payer: Medicaid Other | Attending: Physical Medicine and Rehabilitation | Admitting: Physical Medicine and Rehabilitation

## 2022-04-06 VITALS — BP 104/74 | HR 83 | Temp 97.6°F | Ht 73.0 in | Wt 175.2 lb

## 2022-04-06 DIAGNOSIS — G4700 Insomnia, unspecified: Secondary | ICD-10-CM | POA: Diagnosis not present

## 2022-04-06 DIAGNOSIS — F1910 Other psychoactive substance abuse, uncomplicated: Secondary | ICD-10-CM | POA: Diagnosis not present

## 2022-04-06 DIAGNOSIS — R0989 Other specified symptoms and signs involving the circulatory and respiratory systems: Secondary | ICD-10-CM | POA: Diagnosis present

## 2022-04-06 DIAGNOSIS — F39 Unspecified mood [affective] disorder: Secondary | ICD-10-CM | POA: Insufficient documentation

## 2022-04-06 DIAGNOSIS — Z7409 Other reduced mobility: Secondary | ICD-10-CM | POA: Diagnosis present

## 2022-04-06 DIAGNOSIS — M25512 Pain in left shoulder: Secondary | ICD-10-CM

## 2022-04-06 DIAGNOSIS — Z09 Encounter for follow-up examination after completed treatment for conditions other than malignant neoplasm: Secondary | ICD-10-CM | POA: Insufficient documentation

## 2022-04-06 DIAGNOSIS — F10129 Alcohol abuse with intoxication, unspecified: Secondary | ICD-10-CM

## 2022-04-06 DIAGNOSIS — Z720 Tobacco use: Secondary | ICD-10-CM | POA: Insufficient documentation

## 2022-04-06 DIAGNOSIS — R251 Tremor, unspecified: Secondary | ICD-10-CM | POA: Diagnosis not present

## 2022-04-06 DIAGNOSIS — F17200 Nicotine dependence, unspecified, uncomplicated: Secondary | ICD-10-CM | POA: Insufficient documentation

## 2022-04-06 DIAGNOSIS — M545 Low back pain, unspecified: Secondary | ICD-10-CM | POA: Diagnosis present

## 2022-04-06 DIAGNOSIS — F321 Major depressive disorder, single episode, moderate: Secondary | ICD-10-CM | POA: Insufficient documentation

## 2022-04-06 DIAGNOSIS — S069X9S Unspecified intracranial injury with loss of consciousness of unspecified duration, sequela: Secondary | ICD-10-CM | POA: Diagnosis not present

## 2022-04-06 DIAGNOSIS — F32A Depression, unspecified: Secondary | ICD-10-CM | POA: Insufficient documentation

## 2022-04-06 MED ORDER — PROPRANOLOL HCL 10 MG PO TABS: 10.0000 mg | ORAL_TABLET | Freq: Two times a day (BID) | ORAL | 0 refills | Status: DC

## 2022-04-06 MED ORDER — NICOTINE 21 MG/24HR TD PT24: 21.0000 mg | MEDICATED_PATCH | Freq: Every day | TRANSDERMAL | 0 refills | Status: DC

## 2022-04-06 MED ORDER — DICLOFENAC SODIUM 1 % EX GEL: 4.0000 g | Freq: Four times a day (QID) | CUTANEOUS | 0 refills | Status: DC

## 2022-04-06 MED ORDER — MELOXICAM 15 MG PO TABS: 15.0000 mg | ORAL_TABLET | Freq: Every day | ORAL | 2 refills | Status: DC

## 2022-04-06 MED ORDER — LIDOCAINE 5 % EX PTCH: 1.0000 | MEDICATED_PATCH | CUTANEOUS | 0 refills | Status: DC

## 2022-04-06 MED ORDER — TRAZODONE HCL 100 MG PO TABS: 100.0000 mg | ORAL_TABLET | Freq: Every day | ORAL | 0 refills | Status: DC

## 2022-04-06 MED ORDER — GUAIFENESIN ER 600 MG PO TB12: 600.0000 mg | ORAL_TABLET | Freq: Two times a day (BID) | ORAL | 3 refills | Status: DC

## 2022-04-06 NOTE — Assessment & Plan Note (Signed)
Currently on trazodone 25 to 50 mg at bedtime as needed Start trazodone 100 mg daily at bedtime

## 2022-04-06 NOTE — Assessment & Plan Note (Signed)
1 pack of cigarettes lasts him 4 days ,need to quit smoking cigarettes including risk of lung cancer, COPD, heart disease discussed with the patient he verbalized understanding
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
## 2022-04-06 NOTE — Assessment & Plan Note (Signed)
Patient will be starting physical therapy, OT at home Continue diclofenac 1% gel 4 g 4 times daily, Tylenol 325-650 mg every 4 hours as needed meloxicam 15 mg daily, Robaxin 500 mg every 8 hours as needed, follow up with othopedics

## 2022-04-06 NOTE — Assessment & Plan Note (Signed)
Need to quit using illicit drugs discussed with the patient he verbalized understanding

## 2022-04-06 NOTE — Progress Notes (Signed)
   Subjective:    Patient ID: Bill Brooks., male    DOB: 04-25-1960, 62 y.o.   MRN: [id number]  HPI  Mr. Bruins is a 62 year  1) Back pain: -starts in the back, and then radiates throughout the body -body aches.  -tylenol does not help -ibuprofen 2-3 at a time can help  2) Agitation -can be an issue at home  3) Tremor: -bothers him still  4) left shoulder pain, feels pinched    Review of Systems +diffuse pain    Objective:   Physical Exam Gen: no distress, normal appearing HEENT: oral mucosa pink and moist, NCAT Cardio: Reg rate Chest: normal effort, normal rate of breathing Abd: soft, non-distended Ext: no edema Psych: pleasant, normal affect Skin: intact Neuro: Alert and oriented x2 MSK: ambulating with RW       Assessment & Plan:   1) Tremor: -continue propanolol  2) Left shoulder pain -continue voltaren gel  3) Phlegm -mucomyst prescribed  4) Chronic Pain Syndrome secondary to low back pain -Discussed current symptoms of pain and history of pain.  -Discussed benefits of exercise in reducing pain. -Discussed following foods that may reduce pain: 1) Ginger (especially studied for arthritis)- reduce leukotriene production to decrease inflammation 2) Blueberries- high in phytonutrients that decrease inflammation 3) Salmon- marine omega-3s reduce joint swelling and pain 4) Pumpkin seeds- reduce inflammation 5) dark chocolate- reduces inflammation 6) turmeric- reduces inflammation 7) tart cherries - reduce pain and stiffness 8) extra virgin olive oil - its compound olecanthal helps to block prostaglandins  9) chili peppers- can be eaten or applied topically via capsaicin 10) mint- helpful for headache, muscle aches, joint pain, and itching 11) garlic- reduces inflammation  Link to further information on diet for chronic pain: [email or web address]    5) Impaired mobility:   -start home PT and OT -discussed moving toward cane.   6) Agitation:  Changed depakote to 500mg  HS.   7) Active smoker Nicotine patch ordered.  Discussed benefits of stopping cold [place]  Current impairments: Impaired mobility and ADLs  Patient will require home OT and PT; orders have been placed  The patient's medical and/or psychosocial problems require moderate decision-making during transitions in care from inpatient rehabilitation to home. This transitional care appointment included review of the patient's hospital discharge summary, review of the patient's hospital diagnostic tests and discussion of appropriate follow-up, education of the patient regarding their condition, re-establishment of necessary referrals. I will be reviewing patient's home and/or outpatient therapy notes as they progress through therapy and corresponding with therapists accordingly. I have encouraged compliance with current medication regimen (with adjustment to regimen as needed), follow-up with necessary providers, and the importance of following a healthy diet and exercise routine to maximize recovery, health, and quality of life.

## 2022-04-06 NOTE — Assessment & Plan Note (Signed)
Need to avoid drinking alcohol dicussed with the [patient , he verbalized understanding.
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
## 2022-04-06 NOTE — Assessment & Plan Note (Signed)
[facility] Office Visit from 04/06/2022 in [place]  PHQ-9 Total Score 12      Currently on Seroquel 200 mg daily, trazodone 25 to 50 mg at bedtime as needed. Trazodone increased to 100 mg daily at bedtime due to insomnia Patient referred to psych  He denies SI, HI

## 2022-04-06 NOTE — Patient Instructions (Addendum)
1. Insomnia, unspecified type - Ambulatory referral to Psychiatry - traZODone (DESYREL) 100 MG tablet; Take 1 tablet (100 mg total) by mouth at bedtime.   It is important that you exercise regularly at least 30 minutes 5 times a week as tolerated  Think about what you will eat, plan ahead. Choose " clean, green, fresh or frozen" over canned, processed or packaged foods which are more sugary, salty and fatty. 70 to 75% of food eaten should be vegetables and fruit. Three meals at set times with snacks allowed between meals, but they must be fruit or vegetables. Aim to eat over a 12 hour period , example 7 am to 7 pm, and STOP after  your last meal of the day. Drink water,generally about 64 ounces per day, no other drink is as healthy. Fruit juice is best enjoyed in a healthy way, by EATING the fruit.  Thanks for choosing [facility] we consider it a privelige to serve you.   Behavioral Health Resources:   What if I or someone I know is in crisis?  If you are thinking about harming yourself or having thoughts of suicide, or if you know someone who is, seek help right away.  Call your doctor or mental health care provider.  Call 911 or go to a hospital emergency room to get immediate help, or ask a friend or family member to help you do these things; IF YOU ARE IN [place], YOU MAY GO TO WALK-IN URGENT CARE 24/7 at [facility] (see below)  Call the [place] National Suicide Prevention Lifeline's toll-free, 24-hour hotline at 1-800-273-TALK (217)576-4192) or TTY: 1-800-799-4 TTY (850)757-8706) to talk to a trained counselor.  If you are in crisis, make sure you are not left alone.   If someone else is in crisis, make sure he or she is not left alone   24 Hour :   [facility]  [address] [place], [place] [address] [phone] or [place] 24/7  [facility] Mobile Crisis:  [phone]  [place] National Suicide Hotline: 229 622 2334  [facility] [employer] (Domestic Violence, [place])  3308495951  [place]  [address], [place]  [address]   [phone] or [phone]   [place]: [phone] (8am-4pm) or [phone] (after hours)        [facility], [address], [place], [place] Fax: [phone] [email or web address] *Interpreters available *Accepts all insurance and uninsured for Urgent Care needs *Accepts Medicaid and uninsured for outpatient treatment   [facility]   Mon-Fri: 8am-5pm [place], [place], [place]); [phone]) [email or web address]  *Accepts Medicare  [facility] [place], Fri: 8am-4pm [place], [place], [place] [address] [phone] (phone); [phone] (fax) TaskTown.es  *Accepts Medicare  [facility] Mon-Fri: 9am-5pm  [address], [place], [place] (phone); [phone]  [email or web address]  *Accepts Medicaid  [facility], 8:30am-12pm/1pm-2:30pm [address], [place], [place] (phone); [phone] (fax) www.[email or web address]  *Accepts Medicaid, sliding-scale*Bilingual services available  [facility] Mon-Fri, 8am-7pm [place], [place]  [phone](phone); 7160225418) www.[email or web address]  *Accepts Medicaid *Bilingual services available  [facility] Mon-Fri: 8am-5pm, Saturday by appointment only [place], [place], [place] (phone); [phone] (fax) [email or web address]   [facility] [address] [place],  [place] [phone] [email or web address]  *Free & reduced services for uninsured and underinsured individuals *Bilingual services for Spanish-speaking clients 21 and under  [facility], [address] [place], [place]); [phone]) RunningConvention.[email or web address]  *Bring your own interpreter at first visit *Accepts Medicare and [facility]  [place] Mon-Fri: 9am-5:30pm [address] [place], [place], [place] (phone), [phone] (fax) After hours crisis line: [phone] www.[email or web address]  *Accepts Medicare and Medicaid  [facility], 8am-6pm [address], [place], [place] (phone); [phone] (fax) [email or web address]://[email or web address]  *Subsidized costs available  [facility] Mon-Fri: 8am-4pm [address], [place], [place] [phone](phone); [phone]) [email or web address].[email or web address]  *Accepts Medicaid  [facility] Same day access hours: Mon-Fri, 8:30-3pm Crisis hours: Mon-Fri, 8am-5pm [address] [place], [place] (336) [place] Same day access hours: Mon-Fri, 8:30-3pm Crisis hours: Mon-Fri, 8am-8pm [address] [place], [place] (phone); [phone] (fax) www.[email or web address]  *Accepts Medicaid and Medicare  [facility], [place], Fri: 9am-9pm Tues, Thurs: 9am-6pm [place], [place], [place] (phone); [phone] (fax) [email or web address]  *(Accepts Medicare and Medicaid; payment plans available)*Bilingual services available  [facility] [address], [place], [place] (phone); [phone] (fax) [email or web address]   [facility] [address] [employer], [place], [place]   [email or web address]  *Bilingual services available  [facility]) Mon-Thurs: 8am-8pm [address] [place], [place], [place] (phone); [phone] (fax) [email or web address]  *Accepts Medicaid*Bilingual services available  [facility] [place]. [place], [place] (phone) [email or web address]  *Accepts Medicaid *Bilingual services available  [facility] Mon-Fri, 9am-4:45pm [address] [place], [place] (phone); [phone] (fax) [email or web address]  *[facility] Mon-Thurs: 8:30-8pm, Fri: 8:30am-7pm [address], [place], [place] (3rd floor) [phone] (phone); [phone] (fax) VIPinterview.[email or web address]  *Accepts Medicaid; income-based reduced rates available  [facility] Mon-Fri: 8am-5pm [address], [place], [place] [address] [phone] (phone); [phone] (fax) [email or web address]  *Accepts Medicaid*Bilingual services available   [facility] ([place])  [address], [place] [phone] www.[email or web address]  *Provides direct services to individuals in recovery from mental illness, including support groups, recovery skills classes, and one on one peer support  NAMI [employer] on [place]) [place] helpline: 3066543502  NAMI [place] helpline: [phone] https://[email or web address]  *A community hub for information relating to local resources and services for the friends and families of individuals living alongside a mental health condition, as well as the individuals themselves. Classes and support groups also provided

## 2022-04-06 NOTE — Assessment & Plan Note (Signed)
Currently on Seroquel 200 mg daily, trazodone 25 to 50 mg at bedtime as needed. Trazodone increased to 100 mg daily at bedtime due to insomnia Patient referred to psych  He denies SI, HI
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
## 2022-04-06 NOTE — Assessment & Plan Note (Signed)
Continue propranolol 10 mg twice daily.  

## 2022-04-11 ENCOUNTER — Other Ambulatory Visit (HOSPITAL_COMMUNITY): Payer: Self-pay

## 2022-04-13 ENCOUNTER — Telehealth: Payer: Self-pay

## 2022-04-13 ENCOUNTER — Other Ambulatory Visit: Payer: Self-pay

## 2022-04-13 NOTE — Telephone Encounter (Signed)
Pt called Bill Brooks. And asked about PT referral. Please advise.

## 2022-04-16 NOTE — Telephone Encounter (Signed)
Pt was advised and provided with the phone number.

## 2022-04-17 ENCOUNTER — Telehealth: Payer: Self-pay | Admitting: Physical Medicine and Rehabilitation

## 2022-04-17 NOTE — Telephone Encounter (Signed)
Returned call to patient's sister Threasa Beards and gave contact information for [employer]. Informed that if she needed anything further please contact our office back. Patient's sister verbalized understanding.

## 2022-04-17 NOTE — Telephone Encounter (Signed)
Patient sister is calling asking about Home Health.  She also stated he is becoming very stiff. Please call her.

## 2022-05-03 ENCOUNTER — Other Ambulatory Visit: Payer: Self-pay

## 2022-05-03 ENCOUNTER — Other Ambulatory Visit (HOSPITAL_COMMUNITY): Payer: Self-pay

## 2022-05-03 MED ORDER — SENNOSIDES-DOCUSATE SODIUM 8.6-50 MG PO TABS: 2.0000 | ORAL_TABLET | Freq: Every day | ORAL | 0 refills | Status: DC

## 2022-05-03 MED ORDER — TAMSULOSIN HCL 0.4 MG PO CAPS: 0.4000 mg | ORAL_CAPSULE | Freq: Every day | ORAL | 0 refills | Status: DC

## 2022-05-03 MED ORDER — THIAMINE HCL 100 MG PO TABS: 100.0000 mg | ORAL_TABLET | Freq: Every day | ORAL | 0 refills | Status: DC

## 2022-05-03 MED ORDER — PROPRANOLOL HCL 10 MG PO TABS: 10.0000 mg | ORAL_TABLET | Freq: Two times a day (BID) | ORAL | 0 refills | Status: DC

## 2022-05-03 NOTE — Telephone Encounter (Signed)
Please advise . Pt last ov was 04/06/22. [place]

## 2022-05-04 ENCOUNTER — Other Ambulatory Visit: Payer: Self-pay | Admitting: Nurse Practitioner

## 2022-05-04 MED ORDER — DIVALPROEX SODIUM 250 MG PO DR TAB: 250.0000 mg | DELAYED_RELEASE_TABLET | Freq: Two times a day (BID) | ORAL | 0 refills | Status: DC

## 2022-05-04 MED ORDER — FOLIC ACID 1 MG PO TABS: 1.0000 mg | ORAL_TABLET | Freq: Every day | ORAL | 0 refills | Status: AC

## 2022-05-04 MED ORDER — METHOCARBAMOL 500 MG PO TABS: 500.0000 mg | ORAL_TABLET | Freq: Three times a day (TID) | ORAL | 0 refills | Status: DC | PRN

## 2022-05-04 MED ORDER — QUETIAPINE FUMARATE 200 MG PO TABS: 200.0000 mg | ORAL_TABLET | Freq: Every day | ORAL | 0 refills | Status: DC

## 2022-05-17 ENCOUNTER — Ambulatory Visit: Payer: Medicaid Other | Admitting: Nurse Practitioner

## 2022-05-17 ENCOUNTER — Encounter: Payer: Self-pay | Admitting: Nurse Practitioner

## 2022-05-17 VITALS — BP 108/74 | HR 94 | Temp 99.1°F | Wt 187.0 lb

## 2022-05-17 DIAGNOSIS — L0291 Cutaneous abscess, unspecified: Secondary | ICD-10-CM | POA: Diagnosis not present

## 2022-05-17 DIAGNOSIS — S99922A Unspecified injury of left foot, initial encounter: Secondary | ICD-10-CM | POA: Diagnosis not present

## 2022-05-17 LAB — POCT GLYCOSYLATED HEMOGLOBIN (HGB A1C): Hemoglobin A1C: 6.1 % — AB (ref 4.0–5.6)

## 2022-05-17 MED ORDER — LIDOCAINE 5 % EX PTCH: 1.0000 | MEDICATED_PATCH | CUTANEOUS | 0 refills | Status: AC

## 2022-05-17 MED ORDER — MUPIROCIN 2 % EX OINT: 1.0000 | TOPICAL_OINTMENT | Freq: Two times a day (BID) | CUTANEOUS | 0 refills | Status: DC

## 2022-05-17 MED ORDER — CEPHALEXIN 500 MG PO CAPS: 500.0000 mg | ORAL_CAPSULE | Freq: Four times a day (QID) | ORAL | 0 refills | Status: AC

## 2022-05-17 NOTE — Assessment & Plan Note (Signed)
-   POCT glycosylated hemoglobin (Hb A1C) - cephALEXin (KEFLEX) 500 MG capsule; Take 1 capsule (500 mg total) by mouth 4 (four) times daily for 10 days.  Dispense: 40 capsule; Refill: 0 - mupirocin ointment (BACTROBAN) 2 %; Apply 1 Application topically 2 (two) times daily.  Dispense: 22 g; Refill: 0   2. Injury of left foot, initial encounter  - POCT glycosylated hemoglobin (Hb A1C) - cephALEXin (KEFLEX) 500 MG capsule; Take 1 capsule (500 mg total) by mouth 4 (four) times daily for 10 days.  Dispense: 40 capsule; Refill: 0 - mupirocin ointment (BACTROBAN) 2 %; Apply 1 Application topically 2 (two) times daily.  Dispense: 22 g; Refill: 0 - Ambulatory referral to Podiatry   Follow up:  Follow up in 1 week with [facility]

## 2022-05-17 NOTE — Patient Instructions (Addendum)
1. Cutaneous abscess, unspecified site  - POCT glycosylated hemoglobin (Hb A1C) - cephALEXin (KEFLEX) 500 MG capsule; Take 1 capsule (500 mg total) by mouth 4 (four) times daily for 10 days.  Dispense: 40 capsule; Refill: 0 - mupirocin ointment (BACTROBAN) 2 %; Apply 1 Application topically 2 (two) times daily.  Dispense: 22 g; Refill: 0   2. Injury of left foot, initial encounter  - POCT glycosylated hemoglobin (Hb A1C) - cephALEXin (KEFLEX) 500 MG capsule; Take 1 capsule (500 mg total) by mouth 4 (four) times daily for 10 days.  Dispense: 40 capsule; Refill: 0 - mupirocin ointment (BACTROBAN) 2 %; Apply 1 Application topically 2 (two) times daily.  Dispense: 22 g; Refill: 0 - Ambulatory referral to Podiatry   Follow up:  Follow up in 1 week with [facility]
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
## 2022-05-18 ENCOUNTER — Ambulatory Visit: Payer: Self-pay | Admitting: Nurse Practitioner

## 2022-05-21 ENCOUNTER — Telehealth: Payer: Self-pay

## 2022-05-21 NOTE — Telephone Encounter (Signed)
PRIOR AUTHORIZATION SUBMITTED FOR LIDODERM/LIDOCAIN 5% PATCH TO INS TODAY VIA [employer] Key: [id number]

## 2022-05-22 ENCOUNTER — Other Ambulatory Visit: Payer: Self-pay

## 2022-05-23 ENCOUNTER — Telehealth: Payer: Self-pay

## 2022-05-23 ENCOUNTER — Other Ambulatory Visit: Payer: Self-pay

## 2022-05-23 MED ORDER — TAMSULOSIN HCL 0.4 MG PO CAPS: 0.4000 mg | ORAL_CAPSULE | Freq: Every day | ORAL | 0 refills | Status: DC

## 2022-05-23 MED ORDER — MELOXICAM 15 MG PO TABS: 15.0000 mg | ORAL_TABLET | Freq: Every day | ORAL | 2 refills | Status: DC

## 2022-05-23 MED ORDER — VITAMIN D3 25 MCG PO TABS: 1000.0000 [IU] | ORAL_TABLET | Freq: Every day | ORAL | 0 refills | Status: DC

## 2022-05-23 MED ORDER — METHOCARBAMOL 500 MG PO TABS: 500.0000 mg | ORAL_TABLET | Freq: Three times a day (TID) | ORAL | 0 refills | Status: DC | PRN

## 2022-05-23 MED ORDER — HYDROCERIN EX CREA: 1.0000 | TOPICAL_CREAM | Freq: Two times a day (BID) | CUTANEOUS | 0 refills | Status: DC

## 2022-05-23 MED ORDER — ACETAMINOPHEN 325 MG PO TABS: 325.0000 mg | ORAL_TABLET | ORAL | 0 refills | Status: DC | PRN

## 2022-05-23 MED ORDER — SENNOSIDES-DOCUSATE SODIUM 8.6-50 MG PO TABS: 2.0000 | ORAL_TABLET | Freq: Every day | ORAL | 0 refills | Status: DC

## 2022-05-23 MED ORDER — THIAMINE HCL 100 MG PO TABS: 100.0000 mg | ORAL_TABLET | Freq: Every day | ORAL | 0 refills | Status: DC

## 2022-05-23 MED ORDER — PROPRANOLOL HCL 10 MG PO TABS: 10.0000 mg | ORAL_TABLET | Freq: Two times a day (BID) | ORAL | 0 refills | Status: DC

## 2022-05-23 MED ORDER — FAMOTIDINE 20 MG PO TABS: 20.0000 mg | ORAL_TABLET | Freq: Two times a day (BID) | ORAL | 0 refills | Status: DC

## 2022-05-23 MED ORDER — QUETIAPINE FUMARATE 200 MG PO TABS: 200.0000 mg | ORAL_TABLET | Freq: Every day | ORAL | 0 refills | Status: DC

## 2022-05-23 NOTE — Telephone Encounter (Signed)
Done. Two new prescription for Magnesium Oxide and Certavite-Antioxidant Tablet. Is this okay to fill?

## 2022-05-23 NOTE — Telephone Encounter (Signed)
Refill request on all medications. Is that okay?

## 2022-05-24 NOTE — Telephone Encounter (Signed)
Denied.

## 2022-05-24 NOTE — Addendum Note (Signed)
Addended by: Jasmine December T on: 05/24/2022 02:29 PM   Modules accepted: Orders

## 2022-05-24 NOTE — Telephone Encounter (Signed)
sent 

## 2022-05-28 ENCOUNTER — Encounter
Payer: Medicaid Other | Attending: Physical Medicine and Rehabilitation | Admitting: Physical Medicine and Rehabilitation

## 2022-05-28 DIAGNOSIS — R0989 Other specified symptoms and signs involving the circulatory and respiratory systems: Secondary | ICD-10-CM | POA: Insufficient documentation

## 2022-05-28 DIAGNOSIS — M25512 Pain in left shoulder: Secondary | ICD-10-CM | POA: Insufficient documentation

## 2022-05-28 DIAGNOSIS — M545 Low back pain, unspecified: Secondary | ICD-10-CM | POA: Insufficient documentation

## 2022-05-28 DIAGNOSIS — Z7409 Other reduced mobility: Secondary | ICD-10-CM | POA: Insufficient documentation

## 2022-05-30 ENCOUNTER — Encounter: Payer: Self-pay | Admitting: Nurse Practitioner

## 2022-05-30 ENCOUNTER — Ambulatory Visit (INDEPENDENT_AMBULATORY_CARE_PROVIDER_SITE_OTHER): Payer: Medicaid Other | Admitting: Nurse Practitioner

## 2022-05-30 VITALS — BP 104/78 | HR 83 | Temp 98.2°F | Ht 73.0 in | Wt 196.6 lb

## 2022-05-30 DIAGNOSIS — L02612 Cutaneous abscess of left foot: Secondary | ICD-10-CM | POA: Diagnosis not present

## 2022-05-30 DIAGNOSIS — Z720 Tobacco use: Secondary | ICD-10-CM | POA: Diagnosis not present

## 2022-05-30 DIAGNOSIS — Z1211 Encounter for screening for malignant neoplasm of colon: Secondary | ICD-10-CM | POA: Diagnosis not present

## 2022-05-30 DIAGNOSIS — G4700 Insomnia, unspecified: Secondary | ICD-10-CM

## 2022-05-30 NOTE — Assessment & Plan Note (Signed)
Site healing well without any sign of infection No redness, drainage swelling noted

## 2022-05-30 NOTE — Patient Instructions (Signed)
Follow these instructions at home: Eating and drinking  Limit or avoid alcohol, caffeinated beverages, and products that contain nicotine and tobacco, especially close to bedtime. These can disrupt your sleep. Do not eat a large meal or eat spicy foods right before bedtime. This can lead to digestive discomfort that can make it hard for you to sleep. Sleep habits  Keep a sleep diary to help you and your health care provider figure out what could be causing your insomnia. Write down: When you sleep. When you wake up during the night. How well you sleep and how rested you feel the next day. Any side effects of medicines you are taking. What you eat and drink. Make your bedroom a dark, comfortable place where it is easy to fall asleep. Put up shades or blackout curtains to block light from outside. Use a white noise machine to block noise. Keep the temperature cool. Limit screen use before bedtime. This includes: Not watching TV. Not using your smartphone, tablet, or computer. Stick to a routine that includes going to bed and waking up at the same times every day and night. This can help you fall asleep faster. Consider making a quiet activity, such as reading, part of your nighttime routine. Try to avoid taking naps during the day so that you sleep better at night. Get out of bed if you are still awake after 15 minutes of trying to sleep. Keep the lights down, but try reading or doing a quiet activity. When you feel sleepy, go back to bed.

## 2022-05-30 NOTE — Progress Notes (Signed)
Established Patient Office Visit  Subjective:  Patient ID: Bill Norrell., male    DOB: 03/13/1960  Age: 62 y.o. MRN: [id number]  CC:  Chief Complaint  Patient presents with   Follow-up    HPI Bill Brooks. is a 62 y.o. male  has a past medical history of Alcohol abuse with intoxication ([place]), Depression, Insomnia, Mood disorder ([place]), Substance abuse ([place]), and TBI (traumatic brain injury) ([place]).   Patient presents for follow-up for abscess of left foot.    Patient had presented to this office on 05/17/2022 with complaints of an abrasion on his left foot that had become infected.  He was treated with Bactroban 2% cream and Keflex.  Patient reported that he has completed full course of antibiotics ordered.  States that the site looks much better he denies fever, chills, malaise, chest pain, nausea, vomiting, pain, numbness, tingling.   Insomnia .patient reports that he is still struggling with insomnia currently on trazodone 100 mg daily, Seroquel 200 mg daily.  Smokes about 1 pack of cigarettes daily, sleeps a lot during the day.  We discussed sleep hygiene today we also discussed referral to psychiatrist.   Cologuard test ordered today, patient was encouraged to bring all medications to his next appointment.  Need for shingles vaccine discussed patient encouraged to get the vaccine at his pharmacy.   Past Medical History:  Diagnosis Date   Alcohol abuse with intoxication ([place])    Depression    Insomnia    Mood disorder ([place])    Substance abuse ([place])    TBI (traumatic brain injury) ([place])     Past Surgical History:  Procedure Laterality Date   IR ANGIOGRAM VISCERAL SELECTIVE  11/13/2021   IR ANGIOGRAM VISCERAL SELECTIVE  11/13/2021   IR ANGIOGRAM VISCERAL SELECTIVE  11/13/2021   IR EMBO ART  VEN HEMORR LYMPH EXTRAV  INC GUIDE ROADMAPPING  11/13/2021   IR GASTR TUBE CONVERT GASTR-JEJ PER W/FL MOD SED  12/08/2021   IR GASTR TUBE CONVERT GASTR-JEJ PER W/FL MOD SED  12/19/2021   IR  RADIOLOGIST EVAL & MGMT  12/26/2021   IR US GUIDE VASC ACCESS RIGHT  11/13/2021   ORIF CLAVICULAR FRACTURE Right 11/20/2021   Procedure: OPEN REDUCTION INTERNAL FIXATION (ORIF) CLAVICULAR FRACTURE;  Surgeon: Altamese Mason Neck, MD;  Location: [facility];  Service: Orthopedics;  Laterality: Right;   ORIF HUMERUS FRACTURE Right 11/20/2021   Procedure: OPEN REDUCTION INTERNAL FIXATION (ORIF) HUMERAL SHAFT FRACTURE;  Surgeon: Altamese Aullville, MD;  Location: [facility];  Service: Orthopedics;  Laterality: Right;   ORIF SHOULDER FRACTURE Left 11/20/2021   Procedure: OPEN REDUCTION INTERNAL FIXATION (ORIF) FRACTURE OF THE LEFT CHOROCOID PROCESS;  Surgeon: Altamese Holley, MD;  Location: [facility];  Service: Orthopedics;  Laterality: Left;   ORIF SHOULDER FRACTURE Left 11/20/2021   Procedure: OPEN REDUCTION INTERNAL FIXATION (ORIF) LEFT ACROMIUM;  Surgeon: Altamese Reliance, MD;  Location: [facility];  Service: Orthopedics;  Laterality: Left;   PEG PLACEMENT N/A 11/30/2021   Procedure: PERCUTANEOUS ENDOSCOPIC GASTROSTOMY (PEG) PLACEMENT;  Surgeon: Georganna Skeans, MD;  Location: [facility];  Service: General;  Laterality: N/A;   RADIOLOGY WITH ANESTHESIA N/A 11/13/2021   Procedure: IR WITH ANESTHESIA;  Surgeon: Michaelle Birks, MD;  Location: [facility];  Service: Radiology;  Laterality: N/A;   TRACHEOSTOMY TUBE PLACEMENT N/A 11/30/2021   Procedure: TRACHEOSTOMY;  Surgeon: Georganna Skeans, MD;  Location: [facility];  Service: General;  Laterality: N/A;    Family History  Problem Relation Age of Onset  Diabetes Father    Diabetes Sister     Social History   Socioeconomic History   Marital status: Single    Spouse name: Not on file   Number of children: Not on file   Years of education: Not on file   Highest education level: Not on file  Occupational History   Not on file  Tobacco Use   Smoking status: Every Day    Packs/day: 1    Types: Cigarettes   Smokeless tobacco: Never  Substance and Sexual Activity   Alcohol use: Yes     Alcohol/week: 6.0 standard drinks of alcohol    Types: 6 Cans of beer per week    Comment: one beer   Drug use: Yes    Types: Cocaine   Sexual activity: Not on file  Other Topics Concern   Not on file  Social History Narrative   Lives with his sister.    Social Determinants of Health   Financial Resource Strain: Not on file  Food Insecurity: Not on file  Transportation Needs: Not on file  Physical Activity: Not on file  Stress: Not on file  Social Connections: Not on file  Intimate Partner Violence: Not on file    Outpatient Medications Prior to Visit  Medication Sig Dispense Refill   acetaminophen (TYLENOL) 325 MG tablet Take 1-2 tablets (325-650 mg total) by mouth every 4 (four) hours as needed for mild pain. 100 tablet 0   Cholecalciferol (VITAMIN D3) 25 MCG (1000 UT) tablet Take 1 tablet (1,000 Units total) by mouth daily. 30 tablet 0   diclofenac Sodium (VOLTAREN) 1 % GEL Apply 4 g topically 4 (four) times daily. 400 g 0   divalproex (DEPAKOTE) 250 MG DR tablet Take 1 tablet (250 mg total) by mouth every 12 (twelve) hours. 180 tablet 0   famotidine (PEPCID) 20 MG tablet Take 1 tablet (20 mg total) by mouth 2 (two) times daily. 60 tablet 0   folic acid (FOLVITE) 1 MG tablet Take 1 tablet (1 mg total) by mouth daily. 90 tablet 0   guaiFENesin (MUCINEX) 600 MG 12 hr tablet Take 1 tablet (600 mg total) by mouth 2 (two) times daily. 60 tablet 3   hydrocerin (EUCERIN) CREA Apply 1 Application topically 2 (two) times daily. 454 g 0   magnesium oxide (MAG-OX) 400 (240 Mg) MG tablet Take 400 mg by mouth daily. Take 1/2 tablet by mouth daily     meloxicam (MOBIC) 15 MG tablet Take 1 tablet (15 mg total) by mouth daily. 30 tablet 2   methocarbamol (ROBAXIN) 500 MG tablet Take 1 tablet (500 mg total) by mouth every 8 (eight) hours as needed for muscle spasms. 45 tablet 0   Multiple Vitamin (MULTIVITAMIN WITH MINERALS) TABS tablet Take 1 tablet by mouth daily. 30 tablet 0   Multiple  Vitamins-Minerals (CERTAVITE/ANTIOXIDANTS) TABS Take 1 tablet by mouth daily. Take one tablet by mouth daily     mupirocin ointment (BACTROBAN) 2 % Apply 1 Application topically 2 (two) times daily. 22 g 0   propranolol (INDERAL) 10 MG tablet Take 1 tablet (10 mg total) by mouth 2 (two) times daily. 180 tablet 0   QUEtiapine (SEROQUEL) 200 MG tablet Take 1 tablet (200 mg total) by mouth at bedtime. 90 tablet 0   senna-docusate (SENOKOT-S) 8.6-50 MG tablet Take 2 tablets by mouth daily at 6 (six) AM. 180 tablet 0   tamsulosin (FLOMAX) 0.4 MG CAPS capsule Take 1 capsule (0.4 mg total) by mouth  daily after supper. 90 capsule 0   thiamine (VITAMIN B1) 100 MG tablet Take 1 tablet (100 mg total) by mouth daily. 90 tablet 0   traZODone (DESYREL) 100 MG tablet Take 1 tablet (100 mg total) by mouth at bedtime. 90 tablet 0   lidocaine (LIDODERM) 5 % Place 1 patch onto the skin daily. Remove & Discard patch within 12 hours or as directed by MD (Patient not taking: Reported on 05/30/2022) 30 patch 0   nicotine (NICODERM CQ - DOSED IN MG/24 HOURS) 21 mg/24hr patch Place 1 patch (21 mg total) onto the skin daily. (Patient not taking: Reported on 05/30/2022) 28 patch 0   polyethylene glycol powder (GLYCOLAX/MIRALAX) 17 GM/SCOOP powder Take 17 g by mouth daily. (Patient not taking: Reported on 05/30/2022) 238 g 0   No facility-administered medications prior to visit.    Allergies  Allergen Reactions   Seasonal Ic [Octacosanol]     ROS Review of Systems  Constitutional: Negative.   Respiratory: Negative.    Cardiovascular: Negative.   Gastrointestinal: Negative.   Musculoskeletal: Negative.   Skin:  Negative for pallor and rash.  Neurological: Negative.   Psychiatric/Behavioral:  Positive for sleep disturbance. Negative for confusion, hallucinations, self-injury and suicidal ideas.       Objective:    Physical Exam Constitutional:      General: He is not in acute distress.    Appearance: Normal  appearance. He is not ill-appearing, toxic-appearing or diaphoretic.  Eyes:     General: No scleral icterus.       Right eye: No discharge.        Left eye: No discharge.     Extraocular Movements: Extraocular movements intact.  Cardiovascular:     Rate and Rhythm: Normal rate.     Pulses: Normal pulses.     Heart sounds: Normal heart sounds. No murmur heard.    No friction rub. No gallop.  Pulmonary:     Effort: Pulmonary effort is normal. No respiratory distress.     Breath sounds: Normal breath sounds. No stridor. No wheezing, rhonchi or rales.  Chest:     Chest wall: No tenderness.  Abdominal:     General: There is no distension.     Tenderness: There is no abdominal tenderness. There is no guarding.  Musculoskeletal:        General: No swelling, tenderness or deformity. Normal range of motion.     Right lower leg: No edema.     Left lower leg: No edema.  Skin:    General: Skin is warm and dry.     Capillary Refill: Capillary refill takes less than 2 seconds.     Coloration: Skin is not pale.     Findings: No bruising, erythema or rash.     Comments: Affected site on left foot with clean and dry, no redness drainage or swelling noted.  Site healing well  Neurological:     Mental Status: He is alert and oriented to person, place, and time.     Motor: No weakness.     Coordination: Coordination normal.     Gait: Gait normal.  Psychiatric:        Mood and Affect: Mood normal.        Behavior: Behavior normal.        Thought Content: Thought content normal.        Judgment: Judgment normal.     BP 104/78   Pulse 83   Temp 98.2 F (36.8 C)  Ht 6\' 1"  (1.854 m)   Wt 196 lb 9.6 oz (89.2 kg)   SpO2 100%   BMI 25.94 kg/m  Wt Readings from Last 3 Encounters:  05/30/22 196 lb 9.6 oz (89.2 kg)  05/17/22 187 lb (84.8 kg)  04/06/22 175 lb 3.2 oz (79.5 kg)    Lab Results  Component Value Date   TSH 2.190 02/28/2022   Lab Results  Component Value Date   WBC 8.6  03/26/2022   HGB 10.0 (L) 03/26/2022   HCT 31.8 (L) 03/26/2022   MCV 89.8 03/26/2022   PLT 230 03/26/2022   Lab Results  Component Value Date   NA 137 03/26/2022   K 4.0 03/26/2022   CO2 24 03/26/2022   GLUCOSE 92 03/26/2022   BUN 21 03/26/2022   CREATININE 0.94 03/26/2022   BILITOT 0.3 03/19/2022   ALKPHOS 65 03/19/2022   AST 14 (L) 03/19/2022   ALT 22 03/19/2022   PROT 6.6 03/19/2022   ALBUMIN 2.9 (L) 03/19/2022   CALCIUM 9.1 03/26/2022   ANIONGAP 8 03/26/2022   No results found for: "CHOL" No results found for: "HDL" No results found for: "LDLCALC" Lab Results  Component Value Date   TRIG 600 (H) 12/05/2021   No results found for: "CHOLHDL" Lab Results  Component Value Date   HGBA1C 6.1 (A) 05/17/2022      Assessment & Plan:   Problem List Items Addressed This Visit       Musculoskeletal and Integument   Abscess of skin    Site healing well without any sign of infection No redness, drainage swelling noted        Other   Insomnia    Currently on trazodone 100 mg daily, Seroquel 200 mg daily Sleep hygiene discussed with the patient  Will do a sleep study if no improvement at next visit  He was encouraged to follow up with psych as well.        Tobacco abuse    Patient continues to smoke about 1 pack of cigarette daily Has stopped using nicotine patch Need to quit smoking discussed with the patient he verbalized understanding      Other Visit Diagnoses     Screening for colon cancer    -  Primary   Relevant Orders   Cologuard       No orders of the defined types were placed in this encounter.   Follow-up: No follow-ups on file.    Renee Rival, FNP

## 2022-05-30 NOTE — Assessment & Plan Note (Signed)
Patient continues to smoke about 1 pack of cigarette daily Has stopped using nicotine patch Need to quit smoking discussed with the patient he verbalized understanding

## 2022-05-30 NOTE — Assessment & Plan Note (Signed)
Currently on trazodone 100 mg daily, Seroquel 200 mg daily Sleep hygiene discussed with the patient  Will do a sleep study if no improvement at next visit  He was encouraged to follow up with psych as well.

## 2022-06-05 ENCOUNTER — Telehealth: Payer: Self-pay

## 2022-06-05 MED ORDER — METHOCARBAMOL 500 MG PO TABS: 500.0000 mg | ORAL_TABLET | Freq: Three times a day (TID) | ORAL | 0 refills | Status: DC | PRN

## 2022-06-05 NOTE — Telephone Encounter (Signed)
Refill request

## 2022-06-14 ENCOUNTER — Ambulatory Visit: Payer: Self-pay | Admitting: Family Medicine

## 2022-06-22 ENCOUNTER — Telehealth: Payer: Self-pay

## 2022-06-22 ENCOUNTER — Other Ambulatory Visit: Payer: Self-pay | Admitting: Family Medicine

## 2022-06-22 DIAGNOSIS — G4700 Insomnia, unspecified: Secondary | ICD-10-CM

## 2022-06-22 MED ORDER — FAMOTIDINE 20 MG PO TABS: 20.0000 mg | ORAL_TABLET | Freq: Two times a day (BID) | ORAL | 0 refills | Status: DC

## 2022-06-22 NOTE — Telephone Encounter (Signed)
Refill request for Famotidine 20 mg. Is this ok?

## 2022-06-28 ENCOUNTER — Telehealth: Payer: Self-pay

## 2022-06-29 NOTE — Telephone Encounter (Signed)
Pt is schedule for a telehealth visit on 07/02/22.

## 2022-07-02 ENCOUNTER — Telehealth (INDEPENDENT_AMBULATORY_CARE_PROVIDER_SITE_OTHER): Payer: Medicaid Other | Admitting: Nurse Practitioner

## 2022-07-02 ENCOUNTER — Encounter: Payer: Self-pay | Admitting: Nurse Practitioner

## 2022-07-02 DIAGNOSIS — H9193 Unspecified hearing loss, bilateral: Secondary | ICD-10-CM

## 2022-07-02 DIAGNOSIS — F331 Major depressive disorder, recurrent, moderate: Secondary | ICD-10-CM | POA: Diagnosis not present

## 2022-07-02 NOTE — Assessment & Plan Note (Signed)
Currently on Seroquel 200 mg daily, trazodone 25 to 50 mg at bedtime as needed. Trazodone increased to 100 mg daily at bedtime due to insomnia Patient referred to psych  He denies SI, HI   The psychiatrist office has tried to contact patient without success. Phone number to psychiatrist office given today

## 2022-07-02 NOTE — Progress Notes (Signed)
Virtual Visit via Telephone Note  I connected with Bill Brooks @ on 07/02/22 at  3:20 PM EDT by telephone and verified that I am speaking with the correct person using two identifiers.  I spent 7 minutes on this telehealth encounter  Location: Patient: home Provider: office   I discussed the limitations, risks, security and privacy concerns of performing an evaluation and management service by telephone and the availability of in person appointments. I also discussed with the patient that there may be a patient responsible charge related to this service. The patient expressed understanding and agreed to proceed.   History of Present Illness: [name]  has a past medical history of Alcohol abuse with intoxication ([facility]), Depression, Insomnia, Mood disorder ([facility]), Substance abuse ([facility]), and TBI (traumatic brain injury) ([facility]).  Patient presents with complaints of impaired hearing for a while.  Did not state exactly when the problem started.  He denies ear Brooks, ear discharge, fever, chills, nausea, vomiting.  Never been evaluated for this problem    Observations/Objective: Patient alert and oriented.  Assessment and Plan: Bilateral hearing loss Patient referred to ENT for further evaluation  Current moderate episode of major depressive disorder ([facility]) Currently on Seroquel 200 mg daily, trazodone 25 to 50 mg at bedtime as needed. Trazodone increased to 100 mg daily at bedtime due to insomnia Patient referred to psych  He denies SI, HI   The psychiatrist office has tried to contact patient without success. Phone number to psychiatrist office given today   Follow Up Instructions:    I discussed the assessment and treatment plan with the patient. The patient was provided an opportunity to ask questions and all were answered. The patient agreed with the plan and demonstrated an understanding of the instructions.   The patient was advised to call back or seek an in-person evaluation if the  symptoms worsen or if the condition fails to improve as anticipated.

## 2022-07-02 NOTE — Assessment & Plan Note (Signed)
Patient referred to ENT for further evaluation

## 2022-07-03 ENCOUNTER — Telehealth: Payer: Self-pay

## 2022-07-03 DIAGNOSIS — R195 Other fecal abnormalities: Secondary | ICD-10-CM

## 2022-07-03 NOTE — Telephone Encounter (Signed)
Pt had fecal testing done and it was positive he will need to go for a colonoscopy.  Please advise if you will place the order or if you want me to.  KH

## 2022-07-04 NOTE — Telephone Encounter (Signed)
Done KH 

## 2022-07-05 ENCOUNTER — Telehealth: Payer: Self-pay

## 2022-07-05 ENCOUNTER — Ambulatory Visit (INDEPENDENT_AMBULATORY_CARE_PROVIDER_SITE_OTHER): Payer: Medicaid Other | Admitting: Podiatry

## 2022-07-05 ENCOUNTER — Encounter: Payer: Self-pay | Admitting: Podiatry

## 2022-07-05 DIAGNOSIS — G5762 Lesion of plantar nerve, left lower limb: Secondary | ICD-10-CM

## 2022-07-05 DIAGNOSIS — G5761 Lesion of plantar nerve, right lower limb: Secondary | ICD-10-CM
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
## 2022-07-05 NOTE — Progress Notes (Signed)
Subjective:  Patient ID: Bill Brooks., male    DOB: Nov 18, 1960,  MRN: [id number]  Chief Complaint  Patient presents with   Foot Pain    Pt stated that he feels like he is standing on something sharp     62 y.o. male presents with the above complaint.  Patient presents with bilateral second interspace neuroma.  Pain for touch is progressive and worse worse with ambulation worse with pressure pain scale 7 out of 10 dull achy in nature sharp shooting in nature.  Patient would like to discuss treatment options.  She has no significant sleep MRIs prior to seeing me.  He denies any other acute complaints.   Review of Systems: Negative except as noted in the HPI. Denies N/V/F/Ch.  Past Medical History:  Diagnosis Date   Alcohol abuse with intoxication (HCC)    Depression    Insomnia    Mood disorder (HCC)    Substance abuse (HCC)    TBI (traumatic brain injury) (HCC)     Current Outpatient Medications:    acetaminophen (TYLENOL) 325 MG tablet, Take 1-2 tablets (325-650 mg total) by mouth every 4 (four) hours as needed for mild pain., Disp: 100 tablet, Rfl: 0   Cholecalciferol (VITAMIN D3) 25 MCG (1000 UT) tablet, Take 1 tablet (1,000 Units total) by mouth daily., Disp: 30 tablet, Rfl: 0   diclofenac Sodium (VOLTAREN) 1 % GEL, Apply 4 g topically 4 (four) times daily., Disp: 400 g, Rfl: 0   divalproex (DEPAKOTE) 250 MG DR tablet, Take 1 tablet (250 mg total) by mouth every 12 (twelve) hours., Disp: 180 tablet, Rfl: 0   famotidine (PEPCID) 20 MG tablet, Take 1 tablet (20 mg total) by mouth 2 (two) times daily., Disp: 60 tablet, Rfl: 0   folic acid (FOLVITE) 1 MG tablet, Take 1 tablet (1 mg total) by mouth daily., Disp: 90 tablet, Rfl: 0   guaiFENesin (MUCINEX) 600 MG 12 hr tablet, Take 1 tablet (600 mg total) by mouth 2 (two) times daily., Disp: 60 tablet, Rfl: 3   hydrocerin (EUCERIN) CREA, Apply 1 Application topically 2 (two) times daily., Disp: 454 g, Rfl: 0   lidocaine (LIDODERM) 5 %,  Place 1 patch onto the skin daily. Remove & Discard patch within 12 hours or as directed by MD (Patient not taking: Reported on 05/30/2022), Disp: 30 patch, Rfl: 0   magnesium oxide (MAG-OX) 400 (240 Mg) MG tablet, Take 400 mg by mouth daily. Take 1/2 tablet by mouth daily, Disp: , Rfl:    meloxicam (MOBIC) 15 MG tablet, Take 1 tablet (15 mg total) by mouth daily., Disp: 30 tablet, Rfl: 2   methocarbamol (ROBAXIN) 500 MG tablet, Take 1 tablet (500 mg total) by mouth every 8 (eight) hours as needed for muscle spasms., Disp: 45 tablet, Rfl: 0   Multiple Vitamin (MULTIVITAMIN WITH MINERALS) TABS tablet, Take 1 tablet by mouth daily., Disp: 30 tablet, Rfl: 0   Multiple Vitamins-Minerals (CERTAVITE/ANTIOXIDANTS) TABS, Take 1 tablet by mouth daily. Take one tablet by mouth daily (Patient not taking: Reported on 07/02/2022), Disp: , Rfl:    mupirocin ointment (BACTROBAN) 2 %, Apply 1 Application topically 2 (two) times daily., Disp: 22 g, Rfl: 0   nicotine (NICODERM CQ - DOSED IN MG/24 HOURS) 21 mg/24hr patch, Place 1 patch (21 mg total) onto the skin daily. (Patient not taking: Reported on 05/30/2022), Disp: 28 patch, Rfl: 0   polyethylene glycol powder (GLYCOLAX/MIRALAX) 17 GM/SCOOP powder, Take 17 g by mouth daily. (Patient  not taking: Reported on 05/30/2022), Disp: 238 g, Rfl: 0   propranolol (INDERAL) 10 MG tablet, Take 1 tablet (10 mg total) by mouth 2 (two) times daily., Disp: 180 tablet, Rfl: 0   QUEtiapine (SEROQUEL) 200 MG tablet, Take 1 tablet (200 mg total) by mouth at bedtime., Disp: 90 tablet, Rfl: 0   senna-docusate (SENOKOT-S) 8.6-50 MG tablet, Take 2 tablets by mouth daily at 6 (six) AM., Disp: 180 tablet, Rfl: 0   tamsulosin (FLOMAX) 0.4 MG CAPS capsule, Take 1 capsule (0.4 mg total) by mouth daily after supper., Disp: 90 capsule, Rfl: 0   thiamine (VITAMIN B1) 100 MG tablet, Take 1 tablet (100 mg total) by mouth daily., Disp: 90 tablet, Rfl: 0   traZODone (DESYREL) 100 MG tablet, Take 1 tablet  (100 mg total) by mouth at bedtime., Disp: 90 tablet, Rfl: 0  Social History   Tobacco Use  Smoking Status Every Day   Packs/day: 1   Types: Cigarettes  Smokeless Tobacco Never    Allergies  Allergen Reactions   Seasonal Ic [Octacosanol]    Objective:  There were no vitals filed for this visit. There is no height or weight on file to calculate BMI. Constitutional Well developed. Well nourished.  Vascular Dorsalis pedis pulses palpable bilaterally. Posterior tibial pulses palpable bilaterally. Capillary refill normal to all digits.  No cyanosis or clubbing noted. Pedal hair growth normal.  Neurologic Normal speech. Oriented to person, place, and time. Epicritic sensation to light touch grossly present bilaterally.  Dermatologic Nails well groomed and normal in appearance. No open wounds. No skin lesions.  Orthopedic: Pain on palpation of bilateral second interspace positive Mulder's click noted mild case of Lendell Caprice sign noted of toes.  Findings consistent with neuroma to the second interspace   Radiographs: None Assessment:   1. Morton neuroma, right   2. Morton neuroma, left    Plan:  Patient was evaluated and treated and all questions answered.  Bilateral second interspace neuroma -All questions and concerns were discussed with the patient in extensive detail given the amount of pain that he is having he will benefit from steroid injection help decrease inflammation around the nerve.  Patient agrees with plan like to proceed with steroid injection.  If there is some benefit but no resolve meant patient will need further treatment with alcohol injection versus neuroma excision. -I discussed shoe gear modification   No follow-ups on file.

## 2022-07-05 NOTE — Addendum Note (Signed)
Addended byRaj Janus on: 07/05/2022 08:40 AM   Modules accepted: Orders

## 2022-07-06 ENCOUNTER — Ambulatory Visit: Payer: Medicaid Other | Admitting: Nurse Practitioner
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
## 2022-07-09 ENCOUNTER — Ambulatory Visit: Payer: Medicaid Other | Admitting: Nurse Practitioner

## 2022-07-09 NOTE — Telephone Encounter (Signed)
Done

## 2022-07-11 ENCOUNTER — Telehealth: Payer: Self-pay

## 2022-07-11 DIAGNOSIS — H9193 Unspecified hearing loss, bilateral: Secondary | ICD-10-CM

## 2022-07-11 NOTE — Telephone Encounter (Signed)
Done. Gh 

## 2022-07-16 ENCOUNTER — Encounter: Payer: Self-pay | Admitting: Gastroenterology

## 2022-08-01 ENCOUNTER — Other Ambulatory Visit: Payer: Self-pay | Admitting: Nurse Practitioner

## 2022-08-01 DIAGNOSIS — E559 Vitamin D deficiency, unspecified: Secondary | ICD-10-CM

## 2022-08-01 DIAGNOSIS — K219 Gastro-esophageal reflux disease without esophagitis: Secondary | ICD-10-CM

## 2022-08-01 DIAGNOSIS — M25512 Pain in left shoulder: Secondary | ICD-10-CM

## 2022-08-01 MED ORDER — VITAMIN D3 25 MCG PO TABS: 1000.0000 [IU] | ORAL_TABLET | Freq: Every day | ORAL | 0 refills | Status: DC

## 2022-08-01 MED ORDER — HYDROCERIN EX CREA: 1.0000 | TOPICAL_CREAM | Freq: Two times a day (BID) | CUTANEOUS | 0 refills | Status: DC

## 2022-08-01 MED ORDER — METHOCARBAMOL 500 MG PO TABS
500.0000 mg | ORAL_TABLET | Freq: Three times a day (TID) | ORAL | 0 refills | Status: DC | PRN
Start: 2022-08-01 — End: 2022-08-10

## 2022-08-01 MED ORDER — FAMOTIDINE 20 MG PO TABS: 20.0000 mg | ORAL_TABLET | Freq: Two times a day (BID) | ORAL | 3 refills | Status: DC

## 2022-08-01 MED ORDER — ACETAMINOPHEN 325 MG PO TABS
325.0000 mg | ORAL_TABLET | Freq: Four times a day (QID) | ORAL | 0 refills | Status: AC | PRN
Start: 2022-08-01 — End: ?

## 2022-08-02 ENCOUNTER — Ambulatory Visit (AMBULATORY_SURGERY_CENTER): Payer: Medicaid Other

## 2022-08-02 VITALS — Ht 73.0 in | Wt 185.0 lb

## 2022-08-02 DIAGNOSIS — Z1211 Encounter for screening for malignant neoplasm of colon: Secondary | ICD-10-CM

## 2022-08-02 MED ORDER — PEG 3350-KCL-NA BICARB-NACL 420 G PO SOLR: 4000.0000 mL | Freq: Once | ORAL | 0 refills | Status: AC

## 2022-08-02 NOTE — Progress Notes (Signed)
Pre visit completed via phone call; Patient verified name, DOB, and address;  No egg or soy allergy known to patient;  No issues known to pt with past sedation with any surgeries or procedures; Patient denies ever being told they had issues or difficulty with intubation;  No FH of Malignant Hyperthermia; Pt is not on diet pills; Pt is not on home 02;  Pt is not on blood thinners;  Pt reports issues with constipation -patient reports he does not have any issues as long as he takes his Miralax and Stool softener daily;  No A fib or A flutter; Have any cardiac testing pending--NO Pt instructed to use [email or web address] or GoodRx for a price reduction on prep;   Insurance verified during [facility] appt= Medicaid [employer]  Patient's chart reviewed by Cathlyn Parsons CNRA prior to previsit and patient appropriate for the [facility].  Previsit completed and red dot placed by patient's name on their procedure day (on provider's schedule).    Instructions printed along with a GoodRx coupon and mailed to the patinet per his request;   His sister, [name], present for PV appt via phone;

## 2022-08-08 ENCOUNTER — Telehealth: Payer: Self-pay | Admitting: *Deleted

## 2022-08-08 ENCOUNTER — Telehealth: Payer: Self-pay | Admitting: Nurse Practitioner

## 2022-08-08 NOTE — Telephone Encounter (Signed)
PA Seroquel initiated.

## 2022-08-08 NOTE — Telephone Encounter (Signed)
Quetiapine Fumerate 200 mg Approved 08/08/22-08/08/23

## 2022-08-08 NOTE — Telephone Encounter (Signed)
error 

## 2022-08-08 NOTE — Telephone Encounter (Signed)
Caller & Relationship to patient:  MRN #  [id number]   Call Back Number:   Date of Last Office Visit: 07/11/2022     Date of Next Office Visit: 08/10/2022    Medication(s) to be Refilled: Vitamin D, Famotidine ,Eucerine, methocarbamol  Preferred Pharmacy: [facility] in [place] (V/m was left)  ** Please notify patient to allow 48-72 hours to process** **Let patient know to contact pharmacy at the end of the day to make sure medication is ready. ** **If patient has not been seen in a year or longer, book an appointment **Advise to use MyChart for refill requests OR to contact their pharmacy

## 2022-08-10 ENCOUNTER — Other Ambulatory Visit: Payer: Self-pay

## 2022-08-10 ENCOUNTER — Encounter: Payer: Self-pay | Admitting: Nurse Practitioner

## 2022-08-10 ENCOUNTER — Ambulatory Visit: Payer: Medicaid Other | Admitting: Nurse Practitioner

## 2022-08-10 VITALS — BP 107/79 | HR 68 | Temp 97.5°F | Ht 73.0 in | Wt 196.2 lb

## 2022-08-10 DIAGNOSIS — F1910 Other psychoactive substance abuse, uncomplicated: Secondary | ICD-10-CM | POA: Diagnosis not present

## 2022-08-10 DIAGNOSIS — T7840XA Allergy, unspecified, initial encounter: Secondary | ICD-10-CM

## 2022-08-10 DIAGNOSIS — D649 Anemia, unspecified: Secondary | ICD-10-CM

## 2022-08-10 DIAGNOSIS — K219 Gastro-esophageal reflux disease without esophagitis: Secondary | ICD-10-CM

## 2022-08-10 DIAGNOSIS — H9193 Unspecified hearing loss, bilateral: Secondary | ICD-10-CM

## 2022-08-10 DIAGNOSIS — J432 Centrilobular emphysema: Secondary | ICD-10-CM

## 2022-08-10 DIAGNOSIS — F39 Unspecified mood [affective] disorder: Secondary | ICD-10-CM

## 2022-08-10 DIAGNOSIS — S069X9D Unspecified intracranial injury with loss of consciousness of unspecified duration, subsequent encounter: Secondary | ICD-10-CM

## 2022-08-10 DIAGNOSIS — M1990 Unspecified osteoarthritis, unspecified site: Secondary | ICD-10-CM

## 2022-08-10 DIAGNOSIS — Z720 Tobacco use: Secondary | ICD-10-CM

## 2022-08-10 DIAGNOSIS — H538 Other visual disturbances: Secondary | ICD-10-CM

## 2022-08-10 DIAGNOSIS — E559 Vitamin D deficiency, unspecified: Secondary | ICD-10-CM

## 2022-08-10 DIAGNOSIS — M25512 Pain in left shoulder: Secondary | ICD-10-CM

## 2022-08-10 MED ORDER — HYDROCERIN EX CREA: 1.0000 | TOPICAL_CREAM | Freq: Two times a day (BID) | CUTANEOUS | 0 refills | Status: DC

## 2022-08-10 MED ORDER — ALBUTEROL SULFATE HFA 108 (90 BASE) MCG/ACT IN AERS
2.0000 | INHALATION_SPRAY | Freq: Four times a day (QID) | RESPIRATORY_TRACT | 2 refills | Status: DC | PRN
Start: 2022-08-10 — End: 2022-09-17

## 2022-08-10 MED ORDER — DICLOFENAC SODIUM 1 % EX GEL: 4.0000 g | Freq: Four times a day (QID) | CUTANEOUS | 0 refills | Status: DC

## 2022-08-10 MED ORDER — FLUTICASONE PROPIONATE 50 MCG/ACT NA SUSP
2.0000 | Freq: Every day | NASAL | 6 refills | Status: DC
Start: 2022-08-10 — End: 2022-12-19

## 2022-08-10 MED ORDER — METHOCARBAMOL 500 MG PO TABS
500.0000 mg | ORAL_TABLET | Freq: Three times a day (TID) | ORAL | 0 refills | Status: DC | PRN
Start: 2022-08-10 — End: 2022-09-24

## 2022-08-10 MED ORDER — FAMOTIDINE 20 MG PO TABS
20.0000 mg | ORAL_TABLET | Freq: Two times a day (BID) | ORAL | 3 refills | Status: DC
Start: 2022-08-10 — End: 2023-11-22

## 2022-08-10 MED ORDER — VITAMIN D3 25 MCG PO TABS
1000.0000 [IU] | ORAL_TABLET | Freq: Every day | ORAL | 0 refills | Status: AC
Start: 2022-08-10 — End: ?

## 2022-08-10 NOTE — Progress Notes (Signed)
Established Patient Office Visit  Subjective:  Patient ID: Bill Siegenthaler., male    DOB: 19-Nov-1960  Age: 62 y.o. MRN: [id number]  CC:  Chief Complaint  Patient presents with   Follow-up    HPI Bill Cooksey. is a 62 y.o. male  has a past medical history of Alcohol abuse with intoxication (HCC), Anxiety, Arthritis, Blood transfusion without reported diagnosis (2023), Depression, GERD (gastroesophageal reflux disease), Insomnia, Mood disorder (HCC), Seasonal allergies, Substance abuse (HCC), TBI (traumatic brain injury) (HCC), and TBI (traumatic brain injury) (HCC).  Patient presents for follow-up for his chronic medical conditions.  He is accompanied by his sister who is his medical power of attorney.  Patient's sister is concerned that the patient may have been using drugs again, however patient denies recent drug use ,I discussed doing a drug screen test with the patient which he agrees to.  He was referred to ENT for trouble hearing phone number to the specialist office provided.   Current tobacco smoker/emphysema . started smoking at age 55, currently smokes less than a pack of cigarettes daily.  Denies shortness of breath has occasional cough and wheezing  He is now able to ambulate without use of a walker , has completed PT. has arthritis pain in both knees back shoulders.  Pain is worse when the weather is cold, has some stiffness in his joints in the morning when he wakes up.  Standing for long period of time makes his pain worse.  He is currently on meloxicam as needed, Tylenol arthritis as needed, robaxin PRN.  Has upcoming colonoscopy   Past Medical History:  Diagnosis Date   Alcohol abuse with intoxication (HCC)    Anxiety    on meds   Arthritis    generalized   Blood transfusion without reported diagnosis 2023   Depression    on meds   GERD (gastroesophageal reflux disease)    OTC PRN meds/with certain foods   Insomnia    Mood disorder (HCC)    Seasonal allergies     Substance abuse (HCC)    TBI (traumatic brain injury) (HCC)    TBI (traumatic brain injury) (HCC)     Past Surgical History:  Procedure Laterality Date   IR ANGIOGRAM VISCERAL SELECTIVE  11/13/2021   IR ANGIOGRAM VISCERAL SELECTIVE  11/13/2021   IR ANGIOGRAM VISCERAL SELECTIVE  11/13/2021   IR EMBO ART  VEN HEMORR LYMPH EXTRAV  INC GUIDE ROADMAPPING  11/13/2021   IR GASTR TUBE CONVERT GASTR-JEJ PER W/FL MOD SED  12/08/2021   IR GASTR TUBE CONVERT GASTR-JEJ PER W/FL MOD SED  12/19/2021   IR RADIOLOGIST EVAL & MGMT  12/26/2021   IR US GUIDE VASC ACCESS RIGHT  11/13/2021   ORIF CLAVICULAR FRACTURE Right 11/20/2021   Procedure: OPEN REDUCTION INTERNAL FIXATION (ORIF) CLAVICULAR FRACTURE;  Surgeon: Myrene Galas, MD;  Location: [facility] OR;  Service: Orthopedics;  Laterality: Right;   ORIF HUMERUS FRACTURE Right 11/20/2021   Procedure: OPEN REDUCTION INTERNAL FIXATION (ORIF) HUMERAL SHAFT FRACTURE;  Surgeon: Myrene Galas, MD;  Location: [facility] OR;  Service: Orthopedics;  Laterality: Right;   ORIF SHOULDER FRACTURE Left 11/20/2021   Procedure: OPEN REDUCTION INTERNAL FIXATION (ORIF) FRACTURE OF THE LEFT CHOROCOID PROCESS;  Surgeon: Myrene Galas, MD;  Location: [facility] OR;  Service: Orthopedics;  Laterality: Left;   ORIF SHOULDER FRACTURE Left 11/20/2021   Procedure: OPEN REDUCTION INTERNAL FIXATION (ORIF) LEFT ACROMIUM;  Surgeon: Myrene Galas, MD;  Location: [facility] OR;  Service: Orthopedics;  Laterality: Left;  PEG PLACEMENT N/A 11/30/2021   Procedure: PERCUTANEOUS ENDOSCOPIC GASTROSTOMY (PEG) PLACEMENT;  Surgeon: Violeta Gelinas, MD;  Location: [facility] OR;  Service: General;  Laterality: N/A;   RADIOLOGY WITH ANESTHESIA N/A 11/13/2021   Procedure: IR WITH ANESTHESIA;  Surgeon: Roanna Banning, MD;  Location: [facility] OR;  Service: Radiology;  Laterality: N/A;   TRACHEOSTOMY TUBE PLACEMENT N/A 11/30/2021   Procedure: TRACHEOSTOMY;  Surgeon: Violeta Gelinas, MD;  Location: [facility] OR;  Service: General;  Laterality: N/A;    Family  History  Problem Relation Age of Onset   Colon polyps Mother 22   Colon polyps Father 49   Diabetes Father    Colon polyps Sister 76   Diabetes Sister    Colon cancer Neg Hx    Esophageal cancer Neg Hx    Rectal cancer Neg Hx    Stomach cancer Neg Hx     Social History   Socioeconomic History   Marital status: Single    Spouse name: Not on file   Number of children: Not on file   Years of education: Not on file   Highest education level: Not on file  Occupational History   Not on file  Tobacco Use   Smoking status: Every Day    Packs/day: .25    Types: Cigarettes   Smokeless tobacco: Never  Vaping Use   Vaping Use: Never used  Substance and Sexual Activity   Alcohol use: Yes    Alcohol/week: 4.0 standard drinks of alcohol    Types: 4 Cans of beer per week    Comment: one beer   Drug use: Not Currently    Types: Cocaine   Sexual activity: Not on file  Other Topics Concern   Not on file  Social History Narrative   Lives with his sister.    Social Determinants of Health   Financial Resource Strain: Not on file  Food Insecurity: Not on file  Transportation Needs: Not on file  Physical Activity: Not on file  Stress: Not on file  Social Connections: Not on file  Intimate Partner Violence: Not on file    Outpatient Medications Prior to Visit  Medication Sig Dispense Refill   acetaminophen (TYLENOL) 325 MG tablet Take 1-2 tablets (325-650 mg total) by mouth every 6 (six) hours as needed for mild pain. 100 tablet 0   divalproex (DEPAKOTE) 250 MG DR tablet Take 1 tablet (250 mg total) by mouth every 12 (twelve) hours. 180 tablet 0   folic acid (FOLVITE) 1 MG tablet Take 1 tablet (1 mg total) by mouth daily. 90 tablet 0   guaiFENesin (MUCINEX) 600 MG 12 hr tablet Take 1 tablet (600 mg total) by mouth 2 (two) times daily. 60 tablet 3   magnesium oxide (MAG-OX) 400 (240 Mg) MG tablet Take 400 mg by mouth daily. Take 1/2 tablet by mouth daily     meloxicam (MOBIC) 15 MG  tablet Take 1 tablet (15 mg total) by mouth daily. 30 tablet 2   Multiple Vitamins-Minerals (CERTAVITE/ANTIOXIDANTS) TABS Take 1 tablet by mouth daily. Take one tablet by mouth daily     mupirocin ointment (BACTROBAN) 2 % Apply 1 Application topically 2 (two) times daily. 22 g 0   polyethylene glycol powder (GLYCOLAX/MIRALAX) 17 GM/SCOOP powder Take 17 g by mouth daily. 238 g 0   propranolol (INDERAL) 10 MG tablet Take 1 tablet (10 mg total) by mouth 2 (two) times daily. 180 tablet 0   tamsulosin (FLOMAX) 0.4 MG CAPS capsule Take 1 capsule (0.4  mg total) by mouth daily after supper. 90 capsule 0   thiamine (VITAMIN B1) 100 MG tablet Take 1 tablet (100 mg total) by mouth daily. 90 tablet 0   traZODone (DESYREL) 100 MG tablet Take 1 tablet (100 mg total) by mouth at bedtime. 90 tablet 0   diclofenac Sodium (VOLTAREN) 1 % GEL Apply 4 g topically 4 (four) times daily. (Patient taking differently: Apply 4 g topically 4 (four) times daily as needed.) 400 g 0   famotidine (PEPCID) 20 MG tablet Take 1 tablet (20 mg total) by mouth 2 (two) times daily. 60 tablet 3   hydrocerin (EUCERIN) CREA Apply 1 Application topically 2 (two) times daily. 454 g 0   methocarbamol (ROBAXIN) 500 MG tablet Take 1 tablet (500 mg total) by mouth every 8 (eight) hours as needed for muscle spasms. 45 tablet 0   vitamin D3 (CHOLECALCIFEROL) 25 MCG tablet Take 1 tablet (1,000 Units total) by mouth daily. 30 tablet 0   lidocaine (LIDODERM) 5 % Place 1 patch onto the skin daily. Remove & Discard patch within 12 hours or as directed by MD (Patient not taking: Reported on 08/10/2022) 30 patch 0   Multiple Vitamin (MULTIVITAMIN WITH MINERALS) TABS tablet Take 1 tablet by mouth daily. (Patient not taking: Reported on 08/10/2022) 30 tablet 0   QUEtiapine (SEROQUEL) 200 MG tablet Take 1 tablet (200 mg total) by mouth at bedtime. (Patient not taking: Reported on 08/10/2022) 90 tablet 0   senna-docusate (SENOKOT-S) 8.6-50 MG tablet Take 2 tablets  by mouth daily at 6 (six) AM. (Patient not taking: Reported on 08/10/2022) 180 tablet 0   No facility-administered medications prior to visit.    Allergies  Allergen Reactions   Seasonal Ic [Octacosanol]     ROS Review of Systems  Constitutional:  Negative for activity change, appetite change, chills, diaphoresis, fatigue, fever and unexpected weight change.  HENT:  Negative for congestion, dental problem, drooling and ear discharge.   Eyes:  Positive for visual disturbance. Negative for pain, discharge, redness and itching.  Respiratory:  Positive for cough and wheezing. Negative for apnea, choking, chest tightness and shortness of breath.   Cardiovascular: Negative.  Negative for chest pain, palpitations and leg swelling.  Gastrointestinal:  Negative for abdominal distention, abdominal pain, anal bleeding, blood in stool, constipation, diarrhea and vomiting.  Endocrine: Negative for polydipsia, polyphagia and polyuria.  Genitourinary:  Negative for difficulty urinating, flank pain, frequency and genital sores.  Musculoskeletal:  Positive for arthralgias and back pain. Negative for gait problem and joint swelling.  Skin:  Negative for color change, pallor and rash.  Neurological:  Negative for dizziness, facial asymmetry, light-headedness, numbness and headaches.  Psychiatric/Behavioral:  Negative for agitation, behavioral problems, confusion, hallucinations, self-injury, sleep disturbance and suicidal ideas.       Objective:    Physical Exam Vitals and nursing note reviewed.  Constitutional:      General: He is not in acute distress.    Appearance: Normal appearance. He is not ill-appearing, toxic-appearing or diaphoretic.  HENT:     Right Ear: Tympanic membrane, ear canal and external ear normal. There is no impacted cerumen.     Left Ear: Tympanic membrane, ear canal and external ear normal. There is no impacted cerumen.     Nose: Congestion present.     Mouth/Throat:      Mouth: Mucous membranes are moist.     Pharynx: Oropharynx is clear. No oropharyngeal exudate or posterior oropharyngeal erythema.  Eyes:  General: No scleral icterus.       Right eye: No discharge.        Left eye: No discharge.     Extraocular Movements: Extraocular movements intact.     Conjunctiva/sclera: Conjunctivae normal.  Cardiovascular:     Rate and Rhythm: Normal rate and regular rhythm.     Pulses: Normal pulses.     Heart sounds: Normal heart sounds. No murmur heard.    No friction rub. No gallop.  Pulmonary:     Effort: Pulmonary effort is normal. No respiratory distress.     Breath sounds: Normal breath sounds. No stridor. No wheezing, rhonchi or rales.  Chest:     Chest wall: No tenderness.  Abdominal:     General: There is no distension.     Palpations: Abdomen is soft.     Tenderness: There is no abdominal tenderness. There is no right CVA tenderness, left CVA tenderness or guarding.  Musculoskeletal:        General: No swelling, deformity or signs of injury.     Right lower leg: No edema.     Left lower leg: No edema.     Comments: Able to ambulate without difficulty.   Skin:    General: Skin is warm and dry.     Capillary Refill: Capillary refill takes less than 2 seconds.     Coloration: Skin is not jaundiced or pale.     Findings: No bruising, erythema or lesion.  Neurological:     Mental Status: He is alert and oriented to person, place, and time.     Motor: No weakness.     Coordination: Coordination normal.     Gait: Gait normal.  Psychiatric:        Mood and Affect: Mood normal.        Behavior: Behavior normal.        Thought Content: Thought content normal.        Judgment: Judgment normal.     BP 107/79   Pulse 68   Temp (!) 97.5 F (36.4 C)   Ht 6\' 1"  (1.854 m)   Wt 196 lb 3.2 oz (89 kg)   SpO2 98%   BMI 25.89 kg/m  Wt Readings from Last 3 Encounters:  08/10/22 196 lb 3.2 oz (89 kg)  08/02/22 185 lb (83.9 kg)  05/30/22 196 lb  9.6 oz (89.2 kg)    Lab Results  Component Value Date   TSH 2.190 02/28/2022   Lab Results  Component Value Date   WBC 8.6 03/26/2022   HGB 10.0 (L) 03/26/2022   HCT 31.8 (L) 03/26/2022   MCV 89.8 03/26/2022   PLT 230 03/26/2022   Lab Results  Component Value Date   NA 137 03/26/2022   K 4.0 03/26/2022   CO2 24 03/26/2022   GLUCOSE 92 03/26/2022   BUN 21 03/26/2022   CREATININE 0.94 03/26/2022   BILITOT 0.3 03/19/2022   ALKPHOS 65 03/19/2022   AST 14 (L) 03/19/2022   ALT 22 03/19/2022   PROT 6.6 03/19/2022   ALBUMIN 2.9 (L) 03/19/2022   CALCIUM 9.1 03/26/2022   ANIONGAP 8 03/26/2022   No results found for: "CHOL" No results found for: "HDL" No results found for: "LDLCALC" Lab Results  Component Value Date   TRIG 600 (H) 12/05/2021   No results found for: "CHOLHDL" Lab Results  Component Value Date   HGBA1C 6.1 (A) 05/17/2022      Assessment & Plan:   Problem List  Items Addressed This Visit       Respiratory   Emphysema of lung (HCC) - Primary    Smoking cessation encouraged Start albuterol inhaler 2 puffs every 6 hours as needed for wheezing/shortness of breath      Relevant Medications   fluticasone (FLONASE) 50 MCG/ACT nasal spray   albuterol (VENTOLIN HFA) 108 (90 Base) MCG/ACT inhaler     Nervous and Auditory   TBI (traumatic brain injury) (HCC)   Relevant Orders   Ambulatory referral to Psychiatry   Bilateral hearing loss     Musculoskeletal and Integument   Arthritis    Continue Tylenol as needed, meloxicam 15 mg daily.  Robaxin 500 mg every 8 hours as needed Voltaren gel 4 g up to 4 times daily as needed Engage in regular daily exercises as tolerated Application of heat and ice encouraged      Relevant Medications   diclofenac Sodium (VOLTAREN) 1 % GEL     Other   Polysubstance abuse (HCC)   Relevant Orders   Drug Screen 11 w/Conf, Ser   Mood disorder (HCC)    On Depakote to 50 mg twice daily Seroquel 200 mg daily, trazodone  100 mg daily Referring patient to neuro psychiatrist Dr. Jannifer Franklin      Relevant Orders   Ambulatory referral to Psychiatry   Tobacco abuse    Smokes about less than one pack/day  Asked about quitting: confirms that he/she currently smokes cigarettes Advise to quit smoking: Educated about QUITTING to reduce the risk of cancer, cardio and cerebrovascular disease. Assess willingness: Unwilling to quit at this time, but is working on cutting back. Assist with counseling and pharmacotherapy: Counseled for 5 minutes and literature provided. Arrange for follow up: follow up in 4 months and continue to offer help.       Other Visit Diagnoses     Allergy, initial encounter       Relevant Medications   fluticasone (FLONASE) 50 MCG/ACT nasal spray   Anemia, unspecified type       Relevant Orders   CBC   Blurry vision, bilateral       Relevant Orders   Ambulatory referral to Ophthalmology       Meds ordered this encounter  Medications   fluticasone (FLONASE) 50 MCG/ACT nasal spray    Sig: Place 2 sprays into both nostrils daily.    Dispense:  16 g    Refill:  6   diclofenac Sodium (VOLTAREN) 1 % GEL    Sig: Apply 4 g topically 4 (four) times daily.    Dispense:  400 g    Refill:  0   albuterol (VENTOLIN HFA) 108 (90 Base) MCG/ACT inhaler    Sig: Inhale 2 puffs into the lungs every 6 (six) hours as needed for wheezing or shortness of breath.    Dispense:  8 g    Refill:  2    Follow-up: Return in about 4 months (around 12/10/2022).    Donell Beers, FNP

## 2022-08-10 NOTE — Telephone Encounter (Signed)
Done KH 

## 2022-08-10 NOTE — Assessment & Plan Note (Addendum)
On Depakote to 50 mg twice daily Seroquel 200 mg daily, trazodone 100 mg daily Referring patient to neuro psychiatrist Dr. Jannifer Franklin

## 2022-08-10 NOTE — Patient Instructions (Signed)
1. Centrilobular emphysema (HCC)  albuterol (VENTOLIN HFA) 108 (90 Base) MCG/ACT inhaler; Inhale 2 puffs into the lungs every 6 (six) hours as needed for wheezing or shortness of breath.  Dispense: 8 g; Refill: 2  2. Bilateral hearing loss, unspecified hearing loss type   3. Polysubstance abuse (HCC)  - ToxASSURE Select 13 (MW), Urine  4. Mood disorder (HCC)  - Ambulatory referral to Pediatric Psychiatry  5. Arthritis  - diclofenac Sodium (VOLTAREN) 1 % GEL; Apply 4 g topically 4 (four) times daily.  Dispense: 400 g; Refill: 0  6. Traumatic brain injury with loss of consciousness, subsequent encounter  - Ambulatory referral to Pediatric Psychiatry  7. Tobacco abuse   8. Allergy, initial encounter  - fluticasone (FLONASE) 50 MCG/ACT nasal spray; Place 2 sprays into both nostrils daily.  Dispense: 16 g; Refill: 6  9. Anemia, unspecified type  - CBC     It is important that you exercise regularly at least 30 minutes 5 times a week as tolerated  Think about what you will eat, plan ahead. Choose " clean, green, fresh or frozen" over canned, processed or packaged foods which are more sugary, salty and fatty. 70 to 75% of food eaten should be vegetables and fruit. Three meals at set times with snacks allowed between meals, but they must be fruit or vegetables. Aim to eat over a 12 hour period , example 7 am to 7 pm, and STOP after  your last meal of the day. Drink water,generally about 64 ounces per day, no other drink is as healthy. Fruit juice is best enjoyed in a healthy way, by EATING the fruit.  Thanks for choosing [facility] we consider it a privelige to serve you.

## 2022-08-10 NOTE — Assessment & Plan Note (Signed)
Smokes about less than one pack/day  Asked about quitting: confirms that he/she currently smokes cigarettes Advise to quit smoking: Educated about QUITTING to reduce the risk of cancer, cardio and cerebrovascular disease. Assess willingness: Unwilling to quit at this time, but is working on cutting back. Assist with counseling and pharmacotherapy: Counseled for 5 minutes and literature provided. Arrange for follow up: follow up in 4 months and continue to offer help.

## 2022-08-10 NOTE — Assessment & Plan Note (Addendum)
Continue Tylenol as needed, meloxicam 15 mg daily.  Robaxin 500 mg every 8 hours as needed Voltaren gel 4 g up to 4 times daily as needed Engage in regular daily exercises as tolerated Application of heat and ice encouraged

## 2022-08-10 NOTE — Assessment & Plan Note (Signed)
Smoking cessation encouraged Start albuterol inhaler 2 puffs every 6 hours as needed for wheezing/shortness of breath

## 2022-08-11 LAB — CBC: MCV: 85 fL (ref 79–97)

## 2022-08-11 LAB — DRUG SCREEN 11 W/CONF, SE

## 2022-08-15 LAB — DRUG SCREEN 11 W/CONF, SE
Amphetamines, IA: NEGATIVE ng/mL
Benzodiazepines, IA: NEGATIVE ng/mL

## 2022-08-15 LAB — CBC
Hemoglobin: 13.1 g/dL (ref 13.0–17.7)
Platelets: 312 10*3/uL (ref 150–450)

## 2022-08-21 LAB — DRUG SCREEN 11 W/CONF, SE: Cocaine & Metabolite, IA: POSITIVE ng/mL — AB

## 2022-08-23 LAB — DRUG SCREEN 11 W/CONF, SE
Barbiturates, IA: NEGATIVE ug/mL
Ethyl Alcohol, Enz: NEGATIVE gm/dL
Methadone, IA: NEGATIVE ng/mL
Opiates, IA: NEGATIVE ng/mL
Oxycodones, IA: NEGATIVE ng/mL
Phencyclidine, IA: NEGATIVE ng/mL
Propoxyphene, IA: NEGATIVE ng/mL

## 2022-08-23 LAB — CBC
Hematocrit: 40.2 % (ref 37.5–51.0)
MCH: 27.8 pg (ref 26.6–33.0)
MCHC: 32.6 g/dL (ref 31.5–35.7)
RBC: 4.72 x10E6/uL (ref 4.14–5.80)
RDW: 15 % (ref 11.6–15.4)
WBC: 8.3 10*3/uL (ref 3.4–10.8)

## 2022-08-23 LAB — COCAINE,MS,WB/SP RFX
Benzoylecgonine: 446 ng/mL
Cocaine Confirmation: POSITIVE
Cocaine: NEGATIVE ng/mL

## 2022-08-23 LAB — OXYCODONES,MS,WB/SP RFX
Oxycocone: NEGATIVE ng/mL
Oxycodones Confirmation: NEGATIVE
Oxymorphone: NEGATIVE ng/mL

## 2022-08-27 ENCOUNTER — Telehealth: Payer: Self-pay | Admitting: Gastroenterology

## 2022-08-27 NOTE — Telephone Encounter (Signed)
Patient's sister [name] is calling to reschedule patient's appointment 6/25 at 9:30 states she has not been able to get in touch with the patient all day so she is not sure if he is following proper prep. Patient rescheduled to 7/11 at 9:30. Also wanting to know if he can still use his current prep since it is already mixed and in the fridge.

## 2022-08-27 NOTE — Telephone Encounter (Signed)
Returned call to patients sister [name]. Patient was rescheduled to 09/13/22 @ 9:30 Am. Patient states the prep was already mixed up and in the refrigerator and wanted to know if the prep would still be good by 09/13/22. I told her that it would not be good. I gave the patient a sample of Plenvu and rewrote her instructions exp. Date 2/25 Lot # [id number]. [name] will pick up the sample and new instructions tomorrow.

## 2022-08-28 ENCOUNTER — Encounter: Payer: Medicaid Other | Admitting: Gastroenterology

## 2022-08-28 ENCOUNTER — Other Ambulatory Visit: Payer: Self-pay | Admitting: *Deleted

## 2022-08-28 MED ORDER — MAGNESIUM OXIDE -MG SUPPLEMENT 400 (240 MG) MG PO TABS: 400.0000 mg | ORAL_TABLET | Freq: Every day | ORAL | 3 refills | Status: DC

## 2022-09-11 ENCOUNTER — Telehealth: Payer: Self-pay | Admitting: Gastroenterology

## 2022-09-11 ENCOUNTER — Other Ambulatory Visit: Payer: Self-pay

## 2022-09-11 NOTE — Telephone Encounter (Signed)
Inbound call from patient's sister stating she has not heard anything regarding picking up patient's prep medication or instructions. Advised pre visit was completed on 5/30 and a letter should have been received regarding prep instructions. States they never received letter. Requesting a call back regarding colonoscopy scheduled for 7/11. Please advise, thank you.

## 2022-09-12 NOTE — Telephone Encounter (Signed)
LMOM to call back as soon as possible

## 2022-09-12 NOTE — Telephone Encounter (Signed)
Spoke to sister.  She would like to [facility] d/t being unsure if pt has followed diet or eaten today.  PV rescheduled to 10-24-22 and colonoscopy 11-07-22   Dr. Barron Alvine,  Just an FYI why your patient came off the schedule for tomorrow.  Thanks, [employer]

## 2022-09-13 ENCOUNTER — Encounter: Payer: Medicaid Other | Admitting: Gastroenterology

## 2022-09-14 NOTE — Telephone Encounter (Signed)
Spoke with Melanie(sister) and made her aware of the new PV & colonoscopy appointment dates and let her know that he would not be rescheduled if he missed the PV or the colonoscopy, and would result in dismissal from the practice.

## 2022-09-17 ENCOUNTER — Other Ambulatory Visit: Payer: Self-pay | Admitting: Nurse Practitioner

## 2022-09-17 MED ORDER — ALBUTEROL SULFATE HFA 108 (90 BASE) MCG/ACT IN AERS: 2.0000 | INHALATION_SPRAY | Freq: Four times a day (QID) | RESPIRATORY_TRACT | 2 refills | Status: DC | PRN

## 2022-09-20 ENCOUNTER — Other Ambulatory Visit: Payer: Self-pay | Admitting: *Deleted

## 2022-09-24 ENCOUNTER — Other Ambulatory Visit: Payer: Self-pay

## 2022-09-24 DIAGNOSIS — M25512 Pain in left shoulder: Secondary | ICD-10-CM

## 2022-09-24 MED ORDER — METHOCARBAMOL 500 MG PO TABS
500.0000 mg | ORAL_TABLET | Freq: Three times a day (TID) | ORAL | 0 refills | Status: DC | PRN
Start: 2022-09-24 — End: 2022-10-25

## 2022-09-27 ENCOUNTER — Other Ambulatory Visit: Payer: Self-pay

## 2022-09-27 MED ORDER — PROPRANOLOL HCL 10 MG PO TABS: 10.0000 mg | ORAL_TABLET | Freq: Two times a day (BID) | ORAL | 0 refills | Status: DC

## 2022-09-27 MED ORDER — TAMSULOSIN HCL 0.4 MG PO CAPS: 0.4000 mg | ORAL_CAPSULE | Freq: Every day | ORAL | 0 refills | Status: DC

## 2022-09-27 NOTE — Telephone Encounter (Signed)
Please advise KH 

## 2022-09-28 ENCOUNTER — Other Ambulatory Visit: Payer: Self-pay | Admitting: Nurse Practitioner

## 2022-09-28 MED ORDER — MELOXICAM 15 MG PO TABS: 15.0000 mg | ORAL_TABLET | Freq: Every day | ORAL | 2 refills | Status: DC

## 2022-09-28 MED ORDER — THIAMINE HCL 100 MG PO TABS: 100.0000 mg | ORAL_TABLET | Freq: Every day | ORAL | 0 refills | Status: AC

## 2022-10-10 ENCOUNTER — Telehealth: Payer: Self-pay

## 2022-10-10 NOTE — Telephone Encounter (Signed)
Pharmacy advised of the need of stimulant laxative plus tablet. Please advise not in pt chart. KH

## 2022-10-12 ENCOUNTER — Other Ambulatory Visit: Payer: Self-pay | Admitting: Nurse Practitioner

## 2022-10-12 DIAGNOSIS — K5900 Constipation, unspecified: Secondary | ICD-10-CM

## 2022-10-12 MED ORDER — SENNOSIDES-DOCUSATE SODIUM 8.6-50 MG PO TABS
2.0000 | ORAL_TABLET | Freq: Every day | ORAL | 0 refills | Status: DC
Start: 2022-10-12 — End: 2024-01-17

## 2022-10-24 ENCOUNTER — Telehealth: Payer: Self-pay

## 2022-10-24 NOTE — Telephone Encounter (Signed)
Mutiple attempts made to complete PV. Unable to reach patient. VM left. Pt to call the office back by 5 PM to have PV rescheduled. Pt made aware that in the event that we do not hear back from them their PV and scheduled procedure will be cancelled.  

## 2022-10-25 ENCOUNTER — Other Ambulatory Visit: Payer: Self-pay

## 2022-10-25 DIAGNOSIS — M25512 Pain in left shoulder: Secondary | ICD-10-CM

## 2022-10-25 NOTE — Telephone Encounter (Signed)
Please advise Kh 

## 2022-10-26 MED ORDER — METHOCARBAMOL 500 MG PO TABS
500.0000 mg | ORAL_TABLET | Freq: Three times a day (TID) | ORAL | 0 refills | Status: DC | PRN
Start: 2022-10-26 — End: 2022-11-21

## 2022-10-26 MED ORDER — HYDROCERIN EX CREA: 1.0000 | TOPICAL_CREAM | Freq: Two times a day (BID) | CUTANEOUS | 0 refills | Status: DC

## 2022-10-26 MED ORDER — ALBUTEROL SULFATE HFA 108 (90 BASE) MCG/ACT IN AERS: 2.0000 | INHALATION_SPRAY | Freq: Four times a day (QID) | RESPIRATORY_TRACT | 2 refills | Status: DC | PRN

## 2022-10-29 ENCOUNTER — Ambulatory Visit (AMBULATORY_SURGERY_CENTER): Payer: Medicaid Other

## 2022-10-29 ENCOUNTER — Telehealth: Payer: Self-pay

## 2022-10-29 ENCOUNTER — Other Ambulatory Visit: Payer: Self-pay | Admitting: Nurse Practitioner

## 2022-10-29 VITALS — Ht 73.0 in | Wt 196.0 lb

## 2022-10-29 DIAGNOSIS — Z1211 Encounter for screening for malignant neoplasm of colon: Secondary | ICD-10-CM

## 2022-10-29 MED ORDER — PEG 3350-KCL-NA BICARB-NACL 420 G PO SOLR
4000.0000 mL | Freq: Once | ORAL | 0 refills | Status: AC
Start: 2022-10-29 — End: 2022-10-29

## 2022-10-29 NOTE — Telephone Encounter (Signed)
PV completed.  °

## 2022-10-29 NOTE — Progress Notes (Signed)
No egg or soy allergy known to patient  No issues known to pt with past sedation with any surgeries or procedures Patient denies ever being told they had issues or difficulty with intubation  No FH of Malignant Hyperthermia Pt is not on diet pills Pt is not on  home 02  Pt is not on blood thinners  Pt denies issues with constipation  No A fib or A flutter Have any cardiac testing pending--no LOA: ambulatory Prep: Golytely  Patient's chart reviewed by Cathlyn Parsons CNRA prior to previsit and patient appropriate for the [facility].  Previsit completed and red dot placed by patient's name on their procedure day (on provider's schedule).     PV competed with patients sister [name]. Prep instructions sent via mychart and home address. Prep has been sent to [facility] on [address] as requested.   Patients sister made aware that it is a very high chance that the hard copy will not come in the mail in time. She reports having access to his mychart. She is aware if she is wanting a hard copy the office can provide one in person via pick up.

## 2022-11-07 ENCOUNTER — Ambulatory Visit (AMBULATORY_SURGERY_CENTER): Payer: Medicaid Other | Admitting: Gastroenterology

## 2022-11-07 ENCOUNTER — Encounter: Payer: Self-pay | Admitting: Gastroenterology

## 2022-11-07 VITALS — BP 114/82 | HR 62 | Temp 98.0°F | Resp 12 | Ht 73.0 in | Wt 196.0 lb

## 2022-11-07 DIAGNOSIS — K635 Polyp of colon: Secondary | ICD-10-CM | POA: Diagnosis not present

## 2022-11-07 DIAGNOSIS — Z1211 Encounter for screening for malignant neoplasm of colon: Secondary | ICD-10-CM

## 2022-11-07 DIAGNOSIS — K621 Rectal polyp: Secondary | ICD-10-CM | POA: Diagnosis not present

## 2022-11-07 DIAGNOSIS — K641 Second degree hemorrhoids: Secondary | ICD-10-CM

## 2022-11-07 DIAGNOSIS — D128 Benign neoplasm of rectum: Secondary | ICD-10-CM

## 2022-11-07 MED ORDER — SODIUM CHLORIDE 0.9 % IV SOLN: 500.0000 mL | Freq: Once | INTRAVENOUS | Status: DC

## 2022-11-07 NOTE — Progress Notes (Signed)
Report to PACU, RN, vss, BBS= Clear.  

## 2022-11-07 NOTE — Patient Instructions (Signed)
Please read handouts provided. Continue present medications. Await pathology results. Return to GI clinic as needed.   YOU HAD AN ENDOSCOPIC PROCEDURE TODAY AT THE [facility]:   Refer to the procedure report that was given to you for any specific questions about what was found during the examination.  If the procedure report does not answer your questions, please call your gastroenterologist to clarify.  If you requested that your care partner not be given the details of your procedure findings, then the procedure report has been included in a sealed envelope for you to review at your convenience later.  YOU SHOULD EXPECT: Some feelings of bloating in the abdomen. Passage of more gas than usual.  Walking can help get rid of the air that was put into your GI tract during the procedure and reduce the bloating. If you had a lower endoscopy (such as a colonoscopy or flexible sigmoidoscopy) you may notice spotting of blood in your stool or on the toilet paper. If you underwent a bowel prep for your procedure, you may not have a normal bowel movement for a few days.  Please Note:  You might notice some irritation and congestion in your nose or some drainage.  This is from the oxygen used during your procedure.  There is no need for concern and it should clear up in a day or so.  SYMPTOMS TO REPORT IMMEDIATELY:  Following lower endoscopy (colonoscopy or flexible sigmoidoscopy):  Excessive amounts of blood in the stool  Significant tenderness or worsening of abdominal pains  Swelling of the abdomen that is new, acute  Fever of 100F or higher  For urgent or emergent issues, a gastroenterologist can be reached at any hour by calling [phone]. Do not use MyChart messaging for urgent concerns.    DIET:  We do recommend a small meal at first, but then you may proceed to your regular diet.  Drink plenty of fluids but you should avoid alcoholic beverages for 24 hours.  ACTIVITY:  You  should plan to take it easy for the rest of today and you should NOT DRIVE or use heavy machinery until tomorrow (because of the sedation medicines used during the test).    FOLLOW UP: Our staff will call the number listed on your records the next business day following your procedure.  We will call around 7:15- 8:00 am to check on you and address any questions or concerns that you may have regarding the information given to you following your procedure. If we do not reach you, we will leave a message.     If any biopsies were taken you will be contacted by phone or by letter within the next 1-3 weeks.  Please call us at [phone] if you have not heard about the biopsies in 3 weeks.    SIGNATURES/CONFIDENTIALITY: You and/or your care partner have signed paperwork which will be entered into your electronic medical record.  These signatures attest to the fact that that the information above on your After Visit Summary has been reviewed and is understood.  Full responsibility of the confidentiality of this discharge information lies with you and/or your care-partner. 

## 2022-11-07 NOTE — Op Note (Signed)
[facility] Patient Name: Bill Brooks Procedure Date: 11/07/2022 7:19 AM MRN: [id number] Endoscopist: Doristine Locks , MD, [id number] Age: 62 Referring MD:  Date of Birth: 1961-02-27 Gender: Male Account #: [id number] Procedure:                Colonoscopy Indications:              Screening for colorectal malignant neoplasm, This                            is the patient's first colonoscopy Medicines:                Monitored Anesthesia Care Procedure:                Pre-Anesthesia Assessment:                           - Prior to the procedure, a History and Physical                            was performed, and patient medications and                            allergies were reviewed. The patient's tolerance of                            previous anesthesia was also reviewed. The risks                            and benefits of the procedure and the sedation                            options and risks were discussed with the patient.                            All questions were answered, and informed consent                            was obtained. Prior Anticoagulants: The patient has                            taken no anticoagulant or antiplatelet agents. ASA                            Grade Assessment: II - A patient with mild systemic                            disease. After reviewing the risks and benefits,                            the patient was deemed in satisfactory condition to                            undergo the procedure.  After obtaining informed consent, the colonoscope                            was passed under direct vision. Throughout the                            procedure, the patient's blood pressure, pulse, and                            oxygen saturations were monitored continuously. The                            Olympus Scope [id number] was introduced through the                            anus and advanced to the  the cecum, identified by                            appendiceal orifice and ileocecal valve. The                            colonoscopy was performed without difficulty. The                            patient tolerated the procedure well. The quality                            of the bowel preparation was good. The ileocecal                            valve, appendiceal orifice, and rectum were                            photographed. Scope In: 7:59:47 AM Scope Out: 8:22:30 AM Scope Withdrawal Time: 0 hours 13 minutes 31 seconds  Total Procedure Duration: 0 hours 22 minutes 43 seconds  Findings:                 The perianal and digital rectal examinations were                            normal.                           Six sessile polyps were found in the rectum and                            recto-sigmoid colon. The polyps were 2 to 3 mm in                            size. These polyps were removed with a cold snare.                            Resection and retrieval were complete. Estimated  blood loss was minimal.                           The exam was otherwise normal throughout the                            remainder of the colon.                           Non-bleeding internal hemorrhoids were found during                            retroflexion. The hemorrhoids were small. Complications:            No immediate complications. Estimated Blood Loss:     Estimated blood loss was minimal. Impression:               - Six 2 to 3 mm polyps in the rectum and at the                            recto-sigmoid colon, removed with a cold snare.                            Resected and retrieved.                           - Non-bleeding internal hemorrhoids. Recommendation:           - Patient has a contact number available for                            emergencies. The signs and symptoms of potential                            delayed complications were discussed  with the                            patient. Return to normal activities tomorrow.                            Written discharge instructions were provided to the                            patient.                           - Resume previous diet.                           - Continue present medications.                           - Await pathology results.                           - Repeat colonoscopy for surveillance based on  pathology results.                           - Return to GI clinic PRN. Doristine Locks, MD 11/07/2022 8:26:39 AM

## 2022-11-07 NOTE — Progress Notes (Signed)
GASTROENTEROLOGY PROCEDURE H&P NOTE   Primary Care Physician: Donell Beers, FNP    Reason for Procedure:  Colon Cancer screening  Plan:    Colonoscopy  Patient is appropriate for endoscopic procedure(s) in the ambulatory ([facility]) setting.  The nature of the procedure, as well as the risks, benefits, and alternatives were carefully and thoroughly reviewed with the patient. Ample time for discussion and questions allowed. The patient understood, was satisfied, and agreed to proceed.     HPI: Bill Nilo. is a 62 y.o. male who presents for colonoscopy for routine Colon Cancer screening.  No active GI symptoms.  Fhx notable for mother and father and sister with polyps.  No known family history of colon cancer.  Patient is otherwise without complaints or active issues today.  Past Medical History:  Diagnosis Date   Alcohol abuse with intoxication (HCC)    Anxiety    on meds   Arthritis    generalized   Blood transfusion without reported diagnosis 2023   Depression    on meds   GERD (gastroesophageal reflux disease)    OTC PRN meds/with certain foods   Insomnia    Mood disorder (HCC)    Seasonal allergies    Substance abuse (HCC)    TBI (traumatic brain injury) (HCC)    TBI (traumatic brain injury) (HCC)     Past Surgical History:  Procedure Laterality Date   IR ANGIOGRAM VISCERAL SELECTIVE  11/13/2021   IR ANGIOGRAM VISCERAL SELECTIVE  11/13/2021   IR ANGIOGRAM VISCERAL SELECTIVE  11/13/2021   IR EMBO ART  VEN HEMORR LYMPH EXTRAV  INC GUIDE ROADMAPPING  11/13/2021   IR GASTR TUBE CONVERT GASTR-JEJ PER W/FL MOD SED  12/08/2021   IR GASTR TUBE CONVERT GASTR-JEJ PER W/FL MOD SED  12/19/2021   IR RADIOLOGIST EVAL & MGMT  12/26/2021   IR US GUIDE VASC ACCESS RIGHT  11/13/2021   ORIF CLAVICULAR FRACTURE Right 11/20/2021   Procedure: OPEN REDUCTION INTERNAL FIXATION (ORIF) CLAVICULAR FRACTURE;  Surgeon: Myrene Galas, MD;  Location: [facility] OR;  Service: Orthopedics;   Laterality: Right;   ORIF HUMERUS FRACTURE Right 11/20/2021   Procedure: OPEN REDUCTION INTERNAL FIXATION (ORIF) HUMERAL SHAFT FRACTURE;  Surgeon: Myrene Galas, MD;  Location: [facility] OR;  Service: Orthopedics;  Laterality: Right;   ORIF SHOULDER FRACTURE Left 11/20/2021   Procedure: OPEN REDUCTION INTERNAL FIXATION (ORIF) FRACTURE OF THE LEFT CHOROCOID PROCESS;  Surgeon: Myrene Galas, MD;  Location: [facility] OR;  Service: Orthopedics;  Laterality: Left;   ORIF SHOULDER FRACTURE Left 11/20/2021   Procedure: OPEN REDUCTION INTERNAL FIXATION (ORIF) LEFT ACROMIUM;  Surgeon: Myrene Galas, MD;  Location: [facility] OR;  Service: Orthopedics;  Laterality: Left;   PEG PLACEMENT N/A 11/30/2021   Procedure: PERCUTANEOUS ENDOSCOPIC GASTROSTOMY (PEG) PLACEMENT;  Surgeon: Violeta Gelinas, MD;  Location: [facility] OR;  Service: General;  Laterality: N/A;   RADIOLOGY WITH ANESTHESIA N/A 11/13/2021   Procedure: IR WITH ANESTHESIA;  Surgeon: Roanna Banning, MD;  Location: [facility] OR;  Service: Radiology;  Laterality: N/A;   TRACHEOSTOMY TUBE PLACEMENT N/A 11/30/2021   Procedure: TRACHEOSTOMY;  Surgeon: Violeta Gelinas, MD;  Location: [facility] OR;  Service: General;  Laterality: N/A;    Prior to Admission medications   Medication Sig Start Date End Date Taking? Authorizing Provider  propranolol (INDERAL) 10 MG tablet Take 1 tablet (10 mg total) by mouth 2 (two) times daily. 09/27/22  Yes Paseda, Baird Kay, FNP  QUEtiapine (SEROQUEL) 200 MG tablet Take 1 tablet (200 mg total)  by mouth at bedtime. 05/23/22  Yes Raulkar, Drema Pry, MD  tamsulosin (FLOMAX) 0.4 MG CAPS capsule Take 1 capsule (0.4 mg total) by mouth daily after supper. 09/27/22  Yes Paseda, Baird Kay, FNP  thiamine (VITAMIN B1) 100 MG tablet Take 1 tablet (100 mg total) by mouth daily. 09/28/22  Yes Paseda, Baird Kay, FNP  traZODone (DESYREL) 100 MG tablet Take 1 tablet (100 mg total) by mouth at bedtime. 04/06/22  Yes Paseda, Baird Kay, FNP  vitamin D3 (CHOLECALCIFEROL) 25 MCG tablet  Take 1 tablet (1,000 Units total) by mouth daily. 08/10/22  Yes Paseda, Baird Kay, FNP  acetaminophen (TYLENOL) 325 MG tablet Take 1-2 tablets (325-650 mg total) by mouth every 6 (six) hours as needed for mild pain. 08/01/22   Paseda, Baird Kay, FNP  albuterol (VENTOLIN HFA) 108 (90 Base) MCG/ACT inhaler Inhale 2 puffs into the lungs every 6 (six) hours as needed for wheezing or shortness of breath. 10/29/22   Paseda, Baird Kay, FNP  diclofenac Sodium (VOLTAREN) 1 % GEL Apply 4 g topically 4 (four) times daily. 08/10/22   Donell Beers, FNP  divalproex (DEPAKOTE) 250 MG DR tablet Take 1 tablet (250 mg total) by mouth every 12 (twelve) hours. Patient not taking: Reported on 11/07/2022 05/04/22   Donell Beers, FNP  famotidine (PEPCID) 20 MG tablet Take 1 tablet (20 mg total) by mouth 2 (two) times daily. Patient not taking: Reported on 11/07/2022 08/10/22   Donell Beers, FNP  fluticasone (FLONASE) 50 MCG/ACT nasal spray Place 2 sprays into both nostrils daily. Patient not taking: Reported on 11/07/2022 08/10/22   Donell Beers, FNP  folic acid (FOLVITE) 1 MG tablet Take 1 tablet (1 mg total) by mouth daily. Patient not taking: Reported on 11/07/2022 05/04/22   Donell Beers, FNP  guaiFENesin (MUCINEX) 600 MG 12 hr tablet Take 1 tablet (600 mg total) by mouth 2 (two) times daily. Patient not taking: Reported on 11/07/2022 04/06/22   Horton Chin, MD  hydrocerin (EUCERIN) CREA Apply 1 Application topically 2 (two) times daily. Patient not taking: Reported on 11/07/2022 10/26/22   Donell Beers, FNP  lidocaine (LIDODERM) 5 % Place 1 patch onto the skin daily. Remove & Discard patch within 12 hours or as directed by MD Patient not taking: Reported on 08/10/2022 05/17/22   Ivonne Andrew, NP  magnesium oxide (MAG-OX) 400 (240 Mg) MG tablet Take 1 tablet (400 mg total) by mouth daily. Take 1/2 tablet by mouth daily Patient not taking: Reported on 11/07/2022 08/28/22   Horton Chin,  MD  meloxicam (MOBIC) 15 MG tablet Take 1 tablet (15 mg total) by mouth daily. Patient not taking: Reported on 11/07/2022 09/28/22 09/28/23  Donell Beers, FNP  methocarbamol (ROBAXIN) 500 MG tablet Take 1 tablet (500 mg total) by mouth every 8 (eight) hours as needed for muscle spasms. Patient not taking: Reported on 11/07/2022 10/26/22   Donell Beers, FNP  Multiple Vitamin (MULTIVITAMIN WITH MINERALS) TABS tablet Take 1 tablet by mouth daily. Patient not taking: Reported on 08/10/2022 03/27/22   Love, Evlyn Kanner, PA-C  Multiple Vitamins-Minerals (CERTAVITE/ANTIOXIDANTS) TABS Take 1 tablet by mouth daily. Take one tablet by mouth daily Patient not taking: Reported on 10/29/2022    Horton Chin, MD  mupirocin ointment (BACTROBAN) 2 % Apply 1 Application topically 2 (two) times daily. Patient not taking: Reported on 11/07/2022 05/17/22   Ivonne Andrew, NP  polyethylene glycol powder (GLYCOLAX/MIRALAX) 17 GM/SCOOP powder  Take 17 g by mouth daily. Patient not taking: Reported on 11/07/2022 03/27/22   Love, Evlyn Kanner, PA-C  senna-docusate (SENOKOT-S) 8.6-50 MG tablet Take 2 tablets by mouth daily at 6 (six) AM. 10/12/22   Donell Beers, FNP    Current Outpatient Medications  Medication Sig Dispense Refill   propranolol (INDERAL) 10 MG tablet Take 1 tablet (10 mg total) by mouth 2 (two) times daily. 180 tablet 0   QUEtiapine (SEROQUEL) 200 MG tablet Take 1 tablet (200 mg total) by mouth at bedtime. 90 tablet 0   tamsulosin (FLOMAX) 0.4 MG CAPS capsule Take 1 capsule (0.4 mg total) by mouth daily after supper. 90 capsule 0   thiamine (VITAMIN B1) 100 MG tablet Take 1 tablet (100 mg total) by mouth daily. 90 tablet 0   traZODone (DESYREL) 100 MG tablet Take 1 tablet (100 mg total) by mouth at bedtime. 90 tablet 0   vitamin D3 (CHOLECALCIFEROL) 25 MCG tablet Take 1 tablet (1,000 Units total) by mouth daily. 30 tablet 0   acetaminophen (TYLENOL) 325 MG tablet Take 1-2 tablets (325-650 mg total)  by mouth every 6 (six) hours as needed for mild pain. 100 tablet 0   albuterol (VENTOLIN HFA) 108 (90 Base) MCG/ACT inhaler Inhale 2 puffs into the lungs every 6 (six) hours as needed for wheezing or shortness of breath. 8 g 2   diclofenac Sodium (VOLTAREN) 1 % GEL Apply 4 g topically 4 (four) times daily. 400 g 0   divalproex (DEPAKOTE) 250 MG DR tablet Take 1 tablet (250 mg total) by mouth every 12 (twelve) hours. (Patient not taking: Reported on 11/07/2022) 180 tablet 0   famotidine (PEPCID) 20 MG tablet Take 1 tablet (20 mg total) by mouth 2 (two) times daily. (Patient not taking: Reported on 11/07/2022) 60 tablet 3   fluticasone (FLONASE) 50 MCG/ACT nasal spray Place 2 sprays into both nostrils daily. (Patient not taking: Reported on 11/07/2022) 16 g 6   folic acid (FOLVITE) 1 MG tablet Take 1 tablet (1 mg total) by mouth daily. (Patient not taking: Reported on 11/07/2022) 90 tablet 0   guaiFENesin (MUCINEX) 600 MG 12 hr tablet Take 1 tablet (600 mg total) by mouth 2 (two) times daily. (Patient not taking: Reported on 11/07/2022) 60 tablet 3   hydrocerin (EUCERIN) CREA Apply 1 Application topically 2 (two) times daily. (Patient not taking: Reported on 11/07/2022) 454 g 0   lidocaine (LIDODERM) 5 % Place 1 patch onto the skin daily. Remove & Discard patch within 12 hours or as directed by MD (Patient not taking: Reported on 08/10/2022) 30 patch 0   magnesium oxide (MAG-OX) 400 (240 Mg) MG tablet Take 1 tablet (400 mg total) by mouth daily. Take 1/2 tablet by mouth daily (Patient not taking: Reported on 11/07/2022) 45 tablet 3   meloxicam (MOBIC) 15 MG tablet Take 1 tablet (15 mg total) by mouth daily. (Patient not taking: Reported on 11/07/2022) 30 tablet 2   methocarbamol (ROBAXIN) 500 MG tablet Take 1 tablet (500 mg total) by mouth every 8 (eight) hours as needed for muscle spasms. (Patient not taking: Reported on 11/07/2022) 45 tablet 0   Multiple Vitamin (MULTIVITAMIN WITH MINERALS) TABS tablet Take 1 tablet by  mouth daily. (Patient not taking: Reported on 08/10/2022) 30 tablet 0   Multiple Vitamins-Minerals (CERTAVITE/ANTIOXIDANTS) TABS Take 1 tablet by mouth daily. Take one tablet by mouth daily (Patient not taking: Reported on 10/29/2022)     mupirocin ointment (BACTROBAN) 2 % Apply 1  Application topically 2 (two) times daily. (Patient not taking: Reported on 11/07/2022) 22 g 0   polyethylene glycol powder (GLYCOLAX/MIRALAX) 17 GM/SCOOP powder Take 17 g by mouth daily. (Patient not taking: Reported on 11/07/2022) 238 g 0   senna-docusate (SENOKOT-S) 8.6-50 MG tablet Take 2 tablets by mouth daily at 6 (six) AM. 180 tablet 0   Current Facility-Administered Medications  Medication Dose Route Frequency Provider Last Rate Last Admin   0.9 %  sodium chloride infusion  500 mL Intravenous Once Bill Warwick V, DO        Allergies as of 11/07/2022 - Review Complete 11/07/2022  Allergen Reaction Noted   Seasonal ic [octacosanol]  05/17/2022    Family History  Problem Relation Age of Onset   Colon polyps Mother 33   Colon polyps Father 12   Diabetes Father    Colon polyps Sister 71   Diabetes Sister    Colon cancer Neg Hx    Esophageal cancer Neg Hx    Rectal cancer Neg Hx    Stomach cancer Neg Hx     Social History   Socioeconomic History   Marital status: Single    Spouse name: Not on file   Number of children: Not on file   Years of education: Not on file   Highest education level: Not on file  Occupational History   Not on file  Tobacco Use   Smoking status: Every Day    Current packs/day: 0.25    Types: Cigarettes   Smokeless tobacco: Never  Vaping Use   Vaping status: Never Used  Substance and Sexual Activity   Alcohol use: Yes    Alcohol/week: 4.0 standard drinks of alcohol    Types: 4 Cans of beer per week    Comment: one beer   Drug use: Not Currently    Types: Cocaine   Sexual activity: Not on file  Other Topics Concern   Not on file  Social History Narrative   Lives  with his sister.    Social Determinants of Health   Financial Resource Strain: Not on file  Food Insecurity: Not on file  Transportation Needs: Not on file  Physical Activity: Not on file  Stress: Not on file  Social Connections: Not on file  Intimate Partner Violence: Not on file    Physical Exam: Vital signs in last 24 hours: @BP  126/81   Pulse 64   Temp 98 F (36.7 C)   Ht 6\' 1"  (1.854 m)   Wt 196 lb (88.9 kg)   SpO2 97%   BMI 25.86 kg/m  GEN: NAD EYE: Sclerae anicteric ENT: MMM CV: Non-tachycardic Pulm: CTA b/l GI: Soft, NT/ND NEURO:  Alert & Oriented x 3   Doristine Locks, DO [facility]   11/07/2022 7:40 AM

## 2022-11-07 NOTE — Progress Notes (Signed)
Pt's states no medical or surgical changes since previsit or office visit. 

## 2022-11-07 NOTE — Progress Notes (Signed)
Called to room to assist during endoscopic procedure.  Patient ID and intended procedure confirmed with present staff. Received instructions for my participation in the procedure from the performing physician.  

## 2022-11-08 ENCOUNTER — Telehealth: Payer: Self-pay

## 2022-11-08 NOTE — Telephone Encounter (Signed)
Follow up call to pt, lm for pt to call if having any difficulty with normal activities or eating and drinking.  Also to call if any other questions or concerns.  

## 2022-11-21 ENCOUNTER — Other Ambulatory Visit: Payer: Self-pay | Admitting: Nurse Practitioner

## 2022-11-21 ENCOUNTER — Other Ambulatory Visit: Payer: Self-pay

## 2022-11-21 DIAGNOSIS — M25512 Pain in left shoulder: Secondary | ICD-10-CM

## 2022-11-21 MED ORDER — METHOCARBAMOL 500 MG PO TABS: 500.0000 mg | ORAL_TABLET | Freq: Three times a day (TID) | ORAL | 0 refills | Status: DC | PRN

## 2022-12-10 ENCOUNTER — Encounter: Payer: Self-pay | Admitting: Nurse Practitioner

## 2022-12-10 ENCOUNTER — Ambulatory Visit: Payer: Medicaid Other | Admitting: Nurse Practitioner

## 2022-12-10 VITALS — BP 126/78 | HR 51 | Temp 97.5°F | Ht 74.0 in | Wt 196.4 lb

## 2022-12-10 DIAGNOSIS — R7303 Prediabetes: Secondary | ICD-10-CM | POA: Insufficient documentation

## 2022-12-10 DIAGNOSIS — Z23 Encounter for immunization: Secondary | ICD-10-CM | POA: Insufficient documentation

## 2022-12-10 DIAGNOSIS — J439 Emphysema, unspecified: Secondary | ICD-10-CM

## 2022-12-10 DIAGNOSIS — E785 Hyperlipidemia, unspecified: Secondary | ICD-10-CM | POA: Insufficient documentation

## 2022-12-10 DIAGNOSIS — M62838 Other muscle spasm: Secondary | ICD-10-CM | POA: Diagnosis not present

## 2022-12-10 DIAGNOSIS — E781 Pure hyperglyceridemia: Secondary | ICD-10-CM | POA: Insufficient documentation

## 2022-12-10 DIAGNOSIS — M5441 Lumbago with sciatica, right side: Secondary | ICD-10-CM

## 2022-12-10 DIAGNOSIS — F331 Major depressive disorder, recurrent, moderate: Secondary | ICD-10-CM

## 2022-12-10 DIAGNOSIS — Z720 Tobacco use: Secondary | ICD-10-CM

## 2022-12-10 DIAGNOSIS — G8929 Other chronic pain: Secondary | ICD-10-CM | POA: Insufficient documentation

## 2022-12-10 DIAGNOSIS — F39 Unspecified mood [affective] disorder: Secondary | ICD-10-CM

## 2022-12-10 DIAGNOSIS — R251 Tremor, unspecified: Secondary | ICD-10-CM

## 2022-12-10 MED ORDER — CYCLOBENZAPRINE HCL 5 MG PO TABS
5.0000 mg | ORAL_TABLET | Freq: Three times a day (TID) | ORAL | 1 refills | Status: DC | PRN
Start: 2022-12-10 — End: 2023-01-25

## 2022-12-10 NOTE — Assessment & Plan Note (Signed)
Smoking cessation encouraged Continue albuterol inhaler 2 puffs every 6 hours as needed

## 2022-12-10 NOTE — Assessment & Plan Note (Signed)
Flowsheet Row Office Visit from 08/10/2022 in [facility]  PHQ-9 Total Score 7     Continue trazodone and Seroquel as ordered

## 2022-12-10 NOTE — Assessment & Plan Note (Signed)
1 pack of cigarettes lasts 3 to 4 days , not ready to quit but working on cutting back ,need to quit smoking cigarettes discussed

## 2022-12-10 NOTE — Assessment & Plan Note (Addendum)
Well-controlled on propranolol Continue propranolol 10 mg twice daily

## 2022-12-10 NOTE — Patient Instructions (Signed)
1. Need for influenza vaccination  - Flu vaccine trivalent PF, 6mos and older(Flulaval,Afluria,Fluarix,Fluzone)   Muscle spasm cyclobenzaprine (FLEXERIL) 5 MG tablet; Take 1 tablet (5 mg total) by mouth 3 (three) times daily as needed for muscle spasms.  Dispense: 30 tablet; Refill: 1 - Basic Metabolic Panel   . 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  Dispense: 30 tablet; Refill: 1 - Basic Metabolic Panel    It is important that you exercise regularly at least 30 minutes 5 times a week as tolerated  Think about what you will eat, plan ahead. Choose " clean, green, fresh or frozen" over canned, processed or packaged foods which are more sugary, salty and fatty. 70 to 75% of food eaten should be vegetables and fruit. Three meals at set times with snacks allowed between meals, but they must be fruit or vegetables. Aim to eat over a 12 hour period , example 7 am to 7 pm, and STOP after  your last meal of the day. Drink water,generally about 64 ounces per day, no other drink is as healthy. Fruit juice is best enjoyed in a healthy way, by EATING the fruit.  [facility] for choosing [facility] we consider it a privelige to serve you.

## 2022-12-10 NOTE — Progress Notes (Signed)
Established Patient Office Visit  Subjective:  Patient ID: Bill Keil., male    DOB: 01-Nov-1960  Age: 62 y.o. MRN: [id number]  CC:  Chief Complaint  Patient presents with   Medical Management of Chronic Issues    HPI Bill Kem. is a 62 y.o. male  has a past medical history of Alcohol abuse with intoxication (HCC), Anxiety, Arthritis, Blood transfusion without reported diagnosis (2023), Depression, GERD (gastroesophageal reflux disease), Insomnia, Mood disorder (HCC), Seasonal allergies, Substance abuse (HCC), TBI (traumatic brain injury) (HCC), and TBI (traumatic brain injury) (HCC).   Patient presents for follow-up for his chronic medical conditions.  He is accompanied by his sister  Please see assessment and plan section for full HPI He has upcoming appointment this week with Dr. Dione Booze -ophthalmologist     Lab Results  Component Value Date   TRIG 600 (H) 12/05/2021    Lab Results  Component Value Date   HGBA1C 6.1 (A) 05/17/2022     Last vitamin D Lab Results  Component Value Date   VD25OH 65.32 02/06/2022     Past Medical History:  Diagnosis Date   Alcohol abuse with intoxication (HCC)    Anxiety    on meds   Arthritis    generalized   Blood transfusion without reported diagnosis 2023   Depression    on meds   GERD (gastroesophageal reflux disease)    OTC PRN meds/with certain foods   Insomnia    Mood disorder (HCC)    Seasonal allergies    Substance abuse (HCC)    TBI (traumatic brain injury) (HCC)    TBI (traumatic brain injury) (HCC)     Past Surgical History:  Procedure Laterality Date   IR ANGIOGRAM VISCERAL SELECTIVE  11/13/2021   IR ANGIOGRAM VISCERAL SELECTIVE  11/13/2021   IR ANGIOGRAM VISCERAL SELECTIVE  11/13/2021   IR EMBO ART  VEN HEMORR LYMPH EXTRAV  INC GUIDE ROADMAPPING  11/13/2021   IR GASTR TUBE CONVERT GASTR-JEJ PER W/FL MOD SED  12/08/2021   IR GASTR TUBE CONVERT GASTR-JEJ PER W/FL MOD SED  12/19/2021   IR RADIOLOGIST EVAL &  MGMT  12/26/2021   IR US GUIDE VASC ACCESS RIGHT  11/13/2021   ORIF CLAVICULAR FRACTURE Right 11/20/2021   Procedure: OPEN REDUCTION INTERNAL FIXATION (ORIF) CLAVICULAR FRACTURE;  Surgeon: Myrene Galas, MD;  Location: [facility] OR;  Service: Orthopedics;  Laterality: Right;   ORIF HUMERUS FRACTURE Right 11/20/2021   Procedure: OPEN REDUCTION INTERNAL FIXATION (ORIF) HUMERAL SHAFT FRACTURE;  Surgeon: Myrene Galas, MD;  Location: [facility] OR;  Service: Orthopedics;  Laterality: Right;   ORIF SHOULDER FRACTURE Left 11/20/2021   Procedure: OPEN REDUCTION INTERNAL FIXATION (ORIF) FRACTURE OF THE LEFT CHOROCOID PROCESS;  Surgeon: Myrene Galas, MD;  Location: [facility] OR;  Service: Orthopedics;  Laterality: Left;   ORIF SHOULDER FRACTURE Left 11/20/2021   Procedure: OPEN REDUCTION INTERNAL FIXATION (ORIF) LEFT ACROMIUM;  Surgeon: Myrene Galas, MD;  Location: [facility] OR;  Service: Orthopedics;  Laterality: Left;   PEG PLACEMENT N/A 11/30/2021   Procedure: PERCUTANEOUS ENDOSCOPIC GASTROSTOMY (PEG) PLACEMENT;  Surgeon: Violeta Gelinas, MD;  Location: [facility] OR;  Service: General;  Laterality: N/A;   RADIOLOGY WITH ANESTHESIA N/A 11/13/2021   Procedure: IR WITH ANESTHESIA;  Surgeon: Roanna Banning, MD;  [facility] OR;  Service: Radiology;  Laterality: N/A;   TRACHEOSTOMY TUBE PLACEMENT N/A 11/30/2021   Procedure: TRACHEOSTOMY;  Surgeon: Violeta Gelinas, MD;  Location: [facility] OR;  Service: General;  Laterality: N/A;  Family History  Problem Relation Age of Onset   Colon polyps Mother 43   Colon polyps Father 29   Diabetes Father    Colon polyps Sister 46   Diabetes Sister    Colon cancer Neg Hx    Esophageal cancer Neg Hx    Rectal cancer Neg Hx    Stomach cancer Neg Hx     Social History   Socioeconomic History   Marital status: Single    Spouse name: Not on file   Number of children: Not on file   Years of education: Not on file   Highest education level: Not on file  Occupational History   Not on file  Tobacco Use    Smoking status: Every Day    Current packs/day: 0.25    Types: Cigarettes   Smokeless tobacco: Never  Vaping Use   Vaping status: Never Used  Substance and Sexual Activity   Alcohol use: Yes    Alcohol/week: 4.0 standard drinks of alcohol    Types: 4 Cans of beer per week    Comment: one beer   Drug use: Not Currently    Types: Cocaine   Sexual activity: Not on file  Other Topics Concern   Not on file  Social History Narrative   Lives with his sister.    Social Determinants of Health   Financial Resource Strain: Not on file  Food Insecurity: Not on file  Transportation Needs: Not on file  Physical Activity: Not on file  Stress: Not on file  Social Connections: Not on file  Intimate Partner Violence: Not on file    Outpatient Medications Prior to Visit  Medication Sig Dispense Refill   acetaminophen (TYLENOL) 325 MG tablet Take 1-2 tablets (325-650 mg total) by mouth every 6 (six) hours as needed for mild pain. 100 tablet 0   albuterol (VENTOLIN HFA) 108 (90 Base) MCG/ACT inhaler Inhale 2 puffs into the lungs every 6 (six) hours as needed for wheezing or shortness of breath. 8 g 2   diclofenac Sodium (VOLTAREN) 1 % GEL Apply 4 g topically 4 (four) times daily. 400 g 0   famotidine (PEPCID) 20 MG tablet Take 1 tablet (20 mg total) by mouth 2 (two) times daily. 60 tablet 3   fluticasone (FLONASE) 50 MCG/ACT nasal spray Place 2 sprays into both nostrils daily. 16 g 6   folic acid (FOLVITE) 1 MG tablet Take 1 tablet (1 mg total) by mouth daily. 90 tablet 0   hydrocerin (EUCERIN) CREA Apply 1 Application topically 2 (two) times daily. 454 g 0   lidocaine (LIDODERM) 5 % Place 1 patch onto the skin daily. Remove & Discard patch within 12 hours or as directed by MD 30 patch 0   meloxicam (MOBIC) 15 MG tablet Take 1 tablet (15 mg total) by mouth daily. 30 tablet 2   Multiple Vitamin (MULTIVITAMIN WITH MINERALS) TABS tablet Take 1 tablet by mouth daily. 30 tablet 0   polyethylene  glycol powder (GLYCOLAX/MIRALAX) 17 GM/SCOOP powder Take 17 g by mouth daily. 238 g 0   propranolol (INDERAL) 10 MG tablet Take 1 tablet (10 mg total) by mouth 2 (two) times daily. 180 tablet 0   QUEtiapine (SEROQUEL) 200 MG tablet TAKE ONE TABLET BY MOUTH AT BEDTIME 90 tablet 0   senna-docusate (SENOKOT-S) 8.6-50 MG tablet Take 2 tablets by mouth daily at 6 (six) AM. 180 tablet 0   tamsulosin (FLOMAX) 0.4 MG CAPS capsule Take 1 capsule (0.4 mg total) by  mouth daily after supper. 90 capsule 0   thiamine (VITAMIN B1) 100 MG tablet Take 1 tablet (100 mg total) by mouth daily. 90 tablet 0   traZODone (DESYREL) 100 MG tablet Take 1 tablet (100 mg total) by mouth at bedtime. 90 tablet 0   vitamin D3 (CHOLECALCIFEROL) 25 MCG tablet Take 1 tablet (1,000 Units total) by mouth daily. 30 tablet 0   methocarbamol (ROBAXIN) 500 MG tablet TAKE ONE TABLET BY MOUTH EVERY 8 HOURS AS NEEDED FOR MUSCLE SPASMS 45 tablet 0   methocarbamol (ROBAXIN) 500 MG tablet Take 1 tablet (500 mg total) by mouth every 8 (eight) hours as needed for muscle spasms. 45 tablet 0   divalproex (DEPAKOTE) 250 MG DR tablet Take 1 tablet (250 mg total) by mouth every 12 (twelve) hours. (Patient not taking: Reported on 11/07/2022) 180 tablet 0   guaiFENesin (MUCINEX) 600 MG 12 hr tablet Take 1 tablet (600 mg total) by mouth 2 (two) times daily. (Patient not taking: Reported on 11/07/2022) 60 tablet 3   magnesium oxide (MAG-OX) 400 (240 Mg) MG tablet Take 1 tablet (400 mg total) by mouth daily. Take 1/2 tablet by mouth daily (Patient not taking: Reported on 12/10/2022) 45 tablet 3   Multiple Vitamins-Minerals (CERTAVITE/ANTIOXIDANTS) TABS Take 1 tablet by mouth daily. Take one tablet by mouth daily (Patient not taking: Reported on 10/29/2022)     mupirocin ointment (BACTROBAN) 2 % Apply 1 Application topically 2 (two) times daily. (Patient not taking: Reported on 12/10/2022) 22 g 0   No facility-administered medications prior to visit.    Allergies   Allergen Reactions   Seasonal Ic [Octacosanol]     ROS Review of Systems  Constitutional:  Negative for appetite change, chills, fatigue and fever.  HENT:  Negative for congestion, postnasal drip, rhinorrhea and sneezing.   Respiratory:  Negative for cough, shortness of breath and wheezing.   Cardiovascular:  Negative for chest pain, palpitations and leg swelling.  Gastrointestinal:  Negative for abdominal pain, constipation, nausea and vomiting.  Genitourinary:  Negative for difficulty urinating, dysuria, flank pain and frequency.  Musculoskeletal:  Positive for back pain. Negative for arthralgias, joint swelling and myalgias.  Skin:  Negative for color change, pallor, rash and wound.  Neurological:  Negative for dizziness, facial asymmetry, weakness, numbness and headaches.  Psychiatric/Behavioral:  Negative for behavioral problems, confusion, self-injury and suicidal ideas.       Objective:    Physical Exam Vitals and nursing note reviewed.  Constitutional:      General: He is not in acute distress.    Appearance: Normal appearance. He is not ill-appearing, toxic-appearing or diaphoretic.  HENT:     Mouth/Throat:     Mouth: Mucous membranes are moist.     Pharynx: Oropharynx is clear. No oropharyngeal exudate or posterior oropharyngeal erythema.  Eyes:     General: No scleral icterus.       Right eye: No discharge.        Left eye: No discharge.     Extraocular Movements: Extraocular movements intact.     Conjunctiva/sclera: Conjunctivae normal.  Cardiovascular:     Rate and Rhythm: Normal rate and regular rhythm.     Pulses: Normal pulses.     Heart sounds: Normal heart sounds. No murmur heard.    No friction rub. No gallop.  Pulmonary:     Effort: Pulmonary effort is normal. No respiratory distress.     Breath sounds: Normal breath sounds. No stridor. No wheezing, rhonchi or rales.  Chest:     Chest wall: No tenderness.  Abdominal:     General: There is no  distension.     Palpations: Abdomen is soft.     Tenderness: There is no abdominal tenderness. There is no right CVA tenderness, left CVA tenderness or guarding.  Musculoskeletal:        General: No swelling, tenderness, deformity or signs of injury.     Right lower leg: No edema.     Left lower leg: No edema.  Skin:    General: Skin is warm and dry.     Capillary Refill: Capillary refill takes less than 2 seconds.     Coloration: Skin is not jaundiced or pale.     Findings: No bruising, erythema or lesion.  Neurological:     Mental Status: He is alert and oriented to person, place, and time.     Motor: No weakness.     Coordination: Coordination normal.     Gait: Gait normal.  Psychiatric:        Mood and Affect: Mood normal.        Behavior: Behavior normal.        Thought Content: Thought content normal.        Judgment: Judgment normal.     BP 126/78   Pulse (!) 51   Temp (!) 97.5 F (36.4 C)   Ht 6\' 2"  (1.88 m)   Wt 196 lb 6.4 oz (89.1 kg)   SpO2 97%   BMI 25.22 kg/m  Wt Readings from Last 3 Encounters:  12/10/22 196 lb 6.4 oz (89.1 kg)  11/07/22 196 lb (88.9 kg)  10/29/22 196 lb (88.9 kg)    Lab Results  Component Value Date   TSH 2.190 02/28/2022   Lab Results  Component Value Date   WBC 8.3 08/10/2022   HGB 13.1 08/10/2022   HCT 40.2 08/10/2022   MCV 85 08/10/2022   PLT 312 08/10/2022   Lab Results  Component Value Date   NA 137 03/26/2022   K 4.0 03/26/2022   CO2 24 03/26/2022   GLUCOSE 92 03/26/2022   BUN 21 03/26/2022   CREATININE 0.94 03/26/2022   BILITOT 0.3 03/19/2022   ALKPHOS 65 03/19/2022   AST 14 (L) 03/19/2022   ALT 22 03/19/2022   PROT 6.6 03/19/2022   ALBUMIN 2.9 (L) 03/19/2022   CALCIUM 9.1 03/26/2022   ANIONGAP 8 03/26/2022   No results found for: "CHOL" No results found for: "HDL" No results found for: "LDLCALC" Lab Results  Component Value Date   TRIG 600 (H) 12/05/2021   No results found for: "CHOLHDL" Lab Results   Component Value Date   HGBA1C 6.1 (A) 05/17/2022      Assessment & Plan:   Problem List Items Addressed This Visit       Respiratory   Emphysema of lung (HCC)    Smoking cessation encouraged Continue albuterol inhaler 2 puffs every 6 hours as needed        Nervous and Auditory   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    Continue meloxicam 15 mg daily as needed, Tylenol 650 mg every 6 hours as needed Stated that Robaxin is not helping his muscle spasm Robaxin discontinued start Flexeril 5 mg 3 times daily as needed Continue lidocaine 1 patch every 24 hours as needed Application of heat, ice encouraged      Relevant Medications   cyclobenzaprine (FLEXERIL) 5 MG tablet   Other Relevant Orders  Basic Metabolic Panel     Other   Occasional tremors    Well-controlled on propranolol Continue propranolol 10 mg twice daily      Mood disorder (HCC) - Primary    He has stopped taking Depakote And Seroquel 200 mg daily at bedtime, trazodone 100 mg daily at bedtime Patient was referred to neuropsychiatrist Dr. Jannifer Franklin but he has not been seen We will follow-up on this referral       Tobacco abuse    1 pack of cigarettes lasts 3 to 4 days , not ready to quit but working on cutting back ,need to quit smoking cigarettes discussed      Current moderate episode of major depressive disorder (HCC)    Flowsheet Row Office Visit from 08/10/2022 in [facility]  PHQ-9 Total Score 7     Continue trazodone and Seroquel as ordered      Need for influenza vaccination    Patient educated on CDC recommendation for the vaccine. Verbal consent was obtained from the patient, vaccine administered by nurse, no sign of adverse reactions noted at this time. Patient education on arm soreness and use of tylenol   was discussed. Patient educated on the signs and symptoms of adverse effect and advise to contact the office if they occur. Vaccine information  sheet given to patient.        Relevant Orders   Flu vaccine trivalent PF, 6mos and older(Flulaval,Afluria,Fluarix,Fluzone) (Completed)   Hypertriglyceridemia    Lab Results  Component Value Date   TRIG 600 (H) 12/05/2021  Checking lipid panel today      Relevant Orders   Lipid panel   Prediabetes    Lab Results  Component Value Date   HGBA1C 6.1 (A) 05/17/2022  Checking A1c today Avoid sugar sweets soda      Relevant Orders   Hemoglobin A1c   Muscle spasm    Start Flexeril 5 mg 3 times daily as needed Stretching exercises application of heat or ice encouraged Robaxin discontinued Checking vitamin D, BMP and magnesium levels      Relevant Orders   VITAMIN D 25 Hydroxy (Vit-D Deficiency, Fractures)   Magnesium    Meds ordered this encounter  Medications   cyclobenzaprine (FLEXERIL) 5 MG tablet    Sig: Take 1 tablet (5 mg total) by mouth 3 (three) times daily as needed for muscle spasms.    Dispense:  30 tablet    Refill:  1    Follow-up: Return in about 6 months (around 06/10/2023) for CPE.    Bill Beers, FNP

## 2022-12-10 NOTE — Assessment & Plan Note (Signed)
Continue meloxicam 15 mg daily as needed, Tylenol 650 mg every 6 hours as needed Stated that Robaxin is not helping his muscle spasm Robaxin discontinued start Flexeril 5 mg 3 times daily as needed Continue lidocaine 1 patch every 24 hours as needed Application of heat, ice encouraged

## 2022-12-10 NOTE — Assessment & Plan Note (Signed)
Lab Results  Component Value Date   TRIG 600 (H) 12/05/2021  Checking lipid panel today

## 2022-12-10 NOTE — Assessment & Plan Note (Addendum)
Start Flexeril 5 mg 3 times daily as needed Stretching exercises application of heat or ice encouraged Robaxin discontinued Checking vitamin D, BMP and magnesium levels

## 2022-12-10 NOTE — Assessment & Plan Note (Signed)
He has stopped taking Depakote And Seroquel 200 mg daily at bedtime, trazodone 100 mg daily at bedtime Patient was referred to neuropsychiatrist Dr. Jannifer Franklin but he has not been seen We will follow-up on this referral

## 2022-12-10 NOTE — Assessment & Plan Note (Signed)
Patient educated on CDC recommendation for the vaccine. Verbal consent was obtained from the patient, vaccine administered by nurse, no sign of adverse reactions noted at this time. Patient education on arm soreness and use of tylenol   was discussed. Patient educated on the signs and symptoms of adverse effect and advise to contact the office if they occur. Vaccine information sheet given to patient.

## 2022-12-10 NOTE — Assessment & Plan Note (Addendum)
Lab Results  Component Value Date   HGBA1C 6.1 (A) 05/17/2022  Checking A1c today Avoid sugar sweets soda

## 2022-12-11 ENCOUNTER — Other Ambulatory Visit: Payer: Self-pay | Admitting: Nurse Practitioner

## 2022-12-11 DIAGNOSIS — E785 Hyperlipidemia, unspecified: Secondary | ICD-10-CM

## 2022-12-11 DIAGNOSIS — I700 Atherosclerosis of aorta: Secondary | ICD-10-CM | POA: Insufficient documentation

## 2022-12-11 DIAGNOSIS — N179 Acute kidney failure, unspecified: Secondary | ICD-10-CM

## 2022-12-11 LAB — LIPID PANEL
Chol/HDL Ratio: 4.2 {ratio} (ref 0.0–5.0)
Cholesterol, Total: 225 mg/dL — ABNORMAL HIGH (ref 100–199)
HDL: 53 mg/dL (ref >39)
LDL Chol Calc (NIH): 144 mg/dL — ABNORMAL HIGH (ref 0–99)
Triglycerides: 156 mg/dL — ABNORMAL HIGH (ref 0–149)
VLDL Cholesterol Cal: 28 mg/dL (ref 5–40)

## 2022-12-11 LAB — BASIC METABOLIC PANEL
BUN/Creatinine Ratio: 13 (ref 10–24)
BUN: 18 mg/dL (ref 8–27)
CO2: 19 mmol/L — ABNORMAL LOW (ref 20–29)
Calcium: 9 mg/dL (ref 8.6–10.2)
Chloride: 106 mmol/L (ref 96–106)
Creatinine, Ser: 1.37 mg/dL — ABNORMAL HIGH (ref 0.76–1.27)
Glucose: 104 mg/dL — ABNORMAL HIGH (ref 70–99)
Potassium: 4.7 mmol/L (ref 3.5–5.2)
Sodium: 140 mmol/L (ref 134–144)
eGFR: 58 mL/min/{1.73_m2} — ABNORMAL LOW (ref >59)

## 2022-12-11 LAB — MAGNESIUM: Magnesium: 2.2 mg/dL (ref 1.6–2.3)

## 2022-12-11 LAB — HEMOGLOBIN A1C
Est. average glucose Bld gHb Est-mCnc: 126 mg/dL
Hgb A1c MFr Bld: 6 % — ABNORMAL HIGH (ref 4.8–5.6)

## 2022-12-11 LAB — VITAMIN D 25 HYDROXY (VIT D DEFICIENCY, FRACTURES): Vit D, 25-Hydroxy: 24.9 ng/mL — ABNORMAL LOW (ref 30.0–100.0)

## 2022-12-11 MED ORDER — ATORVASTATIN CALCIUM 10 MG PO TABS
10.0000 mg | ORAL_TABLET | Freq: Every day | ORAL | 1 refills | Status: DC
Start: 2022-12-11 — End: 2023-06-18

## 2022-12-18 ENCOUNTER — Other Ambulatory Visit: Payer: Self-pay | Admitting: Nurse Practitioner

## 2022-12-18 DIAGNOSIS — T7840XA Allergy, unspecified, initial encounter: Secondary | ICD-10-CM

## 2022-12-19 NOTE — Telephone Encounter (Signed)
Please advise Kh 

## 2022-12-20 ENCOUNTER — Other Ambulatory Visit: Payer: Self-pay

## 2022-12-20 NOTE — Telephone Encounter (Signed)
Please add the amount of days that this is needed. Script will need to be resent. Thank You.   Renelda Loma RMA

## 2022-12-21 ENCOUNTER — Other Ambulatory Visit: Payer: Self-pay | Admitting: Nurse Practitioner

## 2022-12-21 MED ORDER — MINERIN CREME EX CREA: 1.0000 | TOPICAL_CREAM | Freq: Two times a day (BID) | CUTANEOUS | 1 refills | Status: DC

## 2022-12-24 NOTE — Telephone Encounter (Signed)
Please advise KH 

## 2022-12-27 ENCOUNTER — Other Ambulatory Visit: Payer: Self-pay | Admitting: Nurse Practitioner

## 2022-12-28 ENCOUNTER — Other Ambulatory Visit: Payer: Self-pay

## 2022-12-28 ENCOUNTER — Other Ambulatory Visit: Payer: Self-pay | Admitting: Nurse Practitioner

## 2022-12-28 MED ORDER — MINERIN CREME EX CREA: 1.0000 | TOPICAL_CREAM | Freq: Two times a day (BID) | CUTANEOUS | 1 refills | Status: DC

## 2022-12-28 NOTE — Telephone Encounter (Signed)
Pharmacy called back to find out how many days would pt be using the cream. Kh

## 2022-12-28 NOTE — Telephone Encounter (Signed)
Please advise KH 

## 2023-01-22 ENCOUNTER — Other Ambulatory Visit: Payer: Self-pay | Admitting: Nurse Practitioner

## 2023-01-25 ENCOUNTER — Other Ambulatory Visit (HOSPITAL_COMMUNITY): Payer: Self-pay | Admitting: Nurse Practitioner

## 2023-01-25 ENCOUNTER — Ambulatory Visit: Payer: Self-pay | Admitting: Nurse Practitioner

## 2023-01-25 DIAGNOSIS — G8929 Other chronic pain: Secondary | ICD-10-CM

## 2023-01-25 MED ORDER — CYCLOBENZAPRINE HCL 5 MG PO TABS
5.0000 mg | ORAL_TABLET | Freq: Three times a day (TID) | ORAL | 1 refills | Status: DC | PRN
Start: 2023-01-25 — End: 2023-11-22

## 2023-01-25 NOTE — Telephone Encounter (Signed)
Copied from CRM [id number]. Topic: Clinical - Prescription Issue >> Jan 25, 2023 12:54 PM Hector Shade B wrote: Reason for [id number] concerns about flex and methocarbonal, should he be taking both is one expired or both recent; Christy  Additional Notes:    Advice only call: [facility] called to review patient's medications.  Stated she had refills left for patient on Methocarbamol and one for Flexeril.  She did not want to fill both.  Medications reviewed and Flexeril noted on medication list.  Methocarbamol was not on list.  Pharmacy states will fill the Flexeril only.   Additional Notes:

## 2023-01-25 NOTE — Telephone Encounter (Signed)
Reason for Disposition . Health Information question, no triage required and triager able to answer question  Answer Assessment - Initial Assessment Questions 1. REASON FOR CALL or QUESTION: "What is your reason for calling today?" or "How can I best help you?" or "What question do you have that I can help answer?"     Advice only call: [facility] called to review patient's medications.  Stated she had refills left for patient on Methocarbamol and one for Flexeril.  She did not want to fill both.  Medications reviewed and Flexeril noted on medication list.  Methocarbamol was not on list.  Pharmacy states will fill the Flexeril only.  Protocols used: Information Only Call - No Triage-A-AH

## 2023-01-28 NOTE — Telephone Encounter (Signed)
Lvm for pt . KH

## 2023-06-17 ENCOUNTER — Encounter: Payer: Self-pay | Admitting: Nurse Practitioner

## 2023-06-17 ENCOUNTER — Ambulatory Visit (INDEPENDENT_AMBULATORY_CARE_PROVIDER_SITE_OTHER): Payer: Self-pay | Admitting: Nurse Practitioner

## 2023-06-17 VITALS — BP 105/76 | HR 71 | Temp 97.0°F | Wt 195.0 lb

## 2023-06-17 DIAGNOSIS — F1090 Alcohol use, unspecified, uncomplicated: Secondary | ICD-10-CM | POA: Insufficient documentation

## 2023-06-17 DIAGNOSIS — M5441 Lumbago with sciatica, right side: Secondary | ICD-10-CM

## 2023-06-17 DIAGNOSIS — Z0000 Encounter for general adult medical examination without abnormal findings: Secondary | ICD-10-CM | POA: Diagnosis not present

## 2023-06-17 DIAGNOSIS — R7303 Prediabetes: Secondary | ICD-10-CM

## 2023-06-17 DIAGNOSIS — G8929 Other chronic pain: Secondary | ICD-10-CM | POA: Diagnosis not present

## 2023-06-17 DIAGNOSIS — E785 Hyperlipidemia, unspecified: Secondary | ICD-10-CM

## 2023-06-17 DIAGNOSIS — J302 Other seasonal allergic rhinitis: Secondary | ICD-10-CM | POA: Insufficient documentation

## 2023-06-17 DIAGNOSIS — Z122 Encounter for screening for malignant neoplasm of respiratory organs: Secondary | ICD-10-CM

## 2023-06-17 DIAGNOSIS — Z13228 Encounter for screening for other metabolic disorders: Secondary | ICD-10-CM

## 2023-06-17 DIAGNOSIS — Z720 Tobacco use: Secondary | ICD-10-CM

## 2023-06-17 DIAGNOSIS — E559 Vitamin D deficiency, unspecified: Secondary | ICD-10-CM | POA: Insufficient documentation

## 2023-06-17 DIAGNOSIS — F39 Unspecified mood [affective] disorder: Secondary | ICD-10-CM

## 2023-06-17 DIAGNOSIS — J439 Emphysema, unspecified: Secondary | ICD-10-CM | POA: Diagnosis not present

## 2023-06-17 DIAGNOSIS — F1910 Other psychoactive substance abuse, uncomplicated: Secondary | ICD-10-CM

## 2023-06-17 DIAGNOSIS — Z1321 Encounter for screening for nutritional disorder: Secondary | ICD-10-CM

## 2023-06-17 DIAGNOSIS — G4700 Insomnia, unspecified: Secondary | ICD-10-CM

## 2023-06-17 DIAGNOSIS — Z1329 Encounter for screening for other suspected endocrine disorder: Secondary | ICD-10-CM

## 2023-06-17 DIAGNOSIS — Z130 Encounter for screening for diseases of the blood and blood-forming organs and certain disorders involving the immune mechanism: Secondary | ICD-10-CM

## 2023-06-17 LAB — POCT GLYCOSYLATED HEMOGLOBIN (HGB A1C): Hemoglobin A1C: 5.6 % (ref 4.0–5.6)

## 2023-06-17 MED ORDER — METHYLPREDNISOLONE NA SUC (PF) 40 MG IJ SOLR
40.0000 mg | Freq: Once | INTRAMUSCULAR | Status: AC
  Administered 2023-06-17: 40 mg via INTRAMUSCULAR

## 2023-06-17 MED ORDER — KETOROLAC TROMETHAMINE 30 MG/ML IJ SOLN
30.0000 mg | Freq: Once | INTRAMUSCULAR | Status: AC
  Administered 2023-06-17: 30 mg via INTRAMUSCULAR

## 2023-06-17 MED ORDER — METHYLPREDNISOLONE 4 MG PO TBPK: ORAL_TABLET | ORAL | 0 refills | Status: DC

## 2023-06-17 MED ORDER — LORATADINE 10 MG PO TABS
10.0000 mg | ORAL_TABLET | Freq: Every day | ORAL | 5 refills | Status: DC
Start: 2023-06-17 — End: 2023-11-22

## 2023-06-17 NOTE — Assessment & Plan Note (Signed)
 Annual exam as documented.  Counseling done include healthy lifestyle involving committing to 150 minutes of exercise per week, heart healthy diet, and attaining healthy weight. The importance of adequate sleep also discussed.  Regular use of seat belt and home safety were also discussed . Changes in health habits are decided on by patient with goals and time frames set for achieving them. Immunization and cancer screening  needs are specifically addressed at this visit.    Encouraged to get pneumococcal vaccine and shingles vaccine at the pharmacy

## 2023-06-17 NOTE — Assessment & Plan Note (Signed)
 Lab Results  Component Value Date   HGBA1C 5.6 06/17/2023

## 2023-06-17 NOTE — Assessment & Plan Note (Signed)
 Need to avoid use of illicit drugs discussed We discussed referral for substance abuse counseling but the patient declined

## 2023-06-17 NOTE — Assessment & Plan Note (Signed)
 Lab Results  Component Value Date   CHOL 225 (H) 12/10/2022   HDL 53 12/10/2022   LDLCALC 144 (H) 12/10/2022   TRIG 156 (H) 12/10/2022   CHOLHDL 4.2 12/10/2022  Currently on atorvastatin 10 mg daily Checking labs Avoid fatty fried foods

## 2023-06-17 NOTE — Progress Notes (Signed)
 Complete physical exam  Patient: Bill Brooks.   DOB: 07/06/1960   63 y.o. Male  MRN: [id number]  Subjective:    Chief Complaint  Patient presents with   Annual Exam    Fasting     Bill Bolds. is a 63 y.o. male  has a past medical history of Alcohol abuse with intoxication (HCC), Anxiety, Arthritis, Blood transfusion without reported diagnosis (2023), Depression, GERD (gastroesophageal reflux disease), Insomnia, Mood disorder (HCC), Seasonal allergies, Substance abuse (HCC), TBI (traumatic brain injury) (HCC), and TBI (traumatic brain injury) (HCC). who presents today for a complete physical exam. He reports consuming a general diet. Rides bike  for exercises  He generally feels well. He reports sleeping poorly.   Insomnia.  Patient reports trouble falling asleep, states that he gets about 4 hours of sleep nightly, naps during the day ,drinks alcohol at nighttime, denies drinking caffeinated drinks.  He also reported snoring. Takes Seroquel 200 mg at bedtime  Tobacco use disorder.  Was smoking 2 packs of cigarettes daily since age 35, he had cut down to less than half pack of cigarettes daily since last year.  He denies cough, shortness of breath, wheezing  Polysubstance abuse, continues to smoke marijuana and cocaine sometimes, he has been avoiding people that use drugs and this has helped him with avoiding drugs.    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  Currently has pain rated 6/10 located to his mid low back area.  States that Flexeril, diclofenac sodium gel  does not help       Most recent fall risk assessment:    06/17/2023   10:16 AM  Fall Risk   Falls in the past year? 0  Number falls in past yr: 0  Injury with Fall? 0  Risk for fall due to : No Fall Risks  Follow up Falls evaluation completed     Most recent depression screenings:    06/17/2023   10:16 AM 12/10/2022   10:23 AM  PHQ 2/9 Scores  PHQ - 2 Score 0 0  PHQ- 9 Score 3          Patient Care Team: Donell Beers, FNP as PCP - General (Nurse Practitioner) Patient, No Pcp Per (General Practice)   Outpatient Medications Prior to Visit  Medication Sig Note   acetaminophen (TYLENOL) 325 MG tablet Take 1-2 tablets (325-650 mg total) by mouth every 6 (six) hours as needed for mild pain.    atorvastatin (LIPITOR) 10 MG tablet Take 1 tablet (10 mg total) by mouth daily.    cyclobenzaprine (FLEXERIL) 5 MG tablet Take 1 tablet (5 mg total) by mouth 3 (three) times daily as needed for muscle spasms.    diclofenac Sodium (VOLTAREN) 1 % GEL Apply 4 g topically 4 (four) times daily.    fluticasone (FLONASE) 50 MCG/ACT nasal spray INSTILL 2 SPRAYS IN EACH NOSTRIL DAILY    guaiFENesin (MUCINEX) 600 MG 12 hr tablet Take 1 tablet (600 mg total) by mouth 2 (two) times daily.    Multiple Vitamin (MULTIVITAMIN WITH MINERALS) TABS tablet Take 1 tablet by mouth daily.    polyethylene glycol powder (GLYCOLAX/MIRALAX) 17 GM/SCOOP powder Take 17 g by mouth daily. 06/17/2023: Takes as needed   QUEtiapine (SEROQUEL) 200 MG tablet TAKE ONE TABLET BY MOUTH AT BEDTIME    senna-docusate (SENOKOT-S) 8.6-50 MG tablet Take 2 tablets by mouth daily at 6 (six) AM. 06/17/2023: Takes as needed   Skin Protectants, Misc. (MINERIN CREME)  CREA APPLY 1 APPLICATION TOPICALLY TWICE DAILY    tamsulosin (FLOMAX) 0.4 MG CAPS capsule Take 1 capsule (0.4 mg total) by mouth daily after supper.    thiamine (VITAMIN B1) 100 MG tablet Take 1 tablet (100 mg total) by mouth daily.    VENTOLIN HFA 108 (90 Base) MCG/ACT inhaler INHALE TWO PUFFS BY MOUTH EVERY 6 HOURS AS NEEDED FOR WHEEZING OR SHORTNESS OF BREATH    vitamin D3 (CHOLECALCIFEROL) 25 MCG tablet Take 1 tablet (1,000 Units total) by mouth daily.    famotidine (PEPCID) 20 MG tablet Take 1 tablet (20 mg total) by mouth 2 (two) times daily. (Patient not taking: Reported on 06/17/2023)    folic acid (FOLVITE) 1 MG tablet Take 1 tablet (1 mg total) by  mouth daily. (Patient not taking: Reported on 06/17/2023)    lidocaine (LIDODERM) 5 % Place 1 patch onto the skin daily. Remove & Discard patch within 12 hours or as directed by MD (Patient not taking: Reported on 06/17/2023)    magnesium oxide (MAG-OX) 400 (240 Mg) MG tablet Take 1 tablet (400 mg total) by mouth daily. Take 1/2 tablet by mouth daily (Patient not taking: Reported on 06/17/2023)    Multiple Vitamins-Minerals (CERTAVITE/ANTIOXIDANTS) TABS Take 1 tablet by mouth daily. Take one tablet by mouth daily (Patient not taking: Reported on 06/17/2023)    propranolol (INDERAL) 10 MG tablet Take 1 tablet (10 mg total) by mouth 2 (two) times daily. (Patient not taking: Reported on 06/17/2023)    [DISCONTINUED] divalproex (DEPAKOTE) 250 MG DR tablet Take 1 tablet (250 mg total) by mouth every 12 (twelve) hours. (Patient not taking: Reported on 06/17/2023)    [DISCONTINUED] traZODone (DESYREL) 100 MG tablet Take 1 tablet (100 mg total) by mouth at bedtime. (Patient not taking: Reported on 06/17/2023)    No facility-administered medications prior to visit.    Review of Systems  Constitutional:  Negative for appetite change, chills, fatigue and fever.  HENT:  Positive for congestion, rhinorrhea and sneezing. Negative for postnasal drip.   Eyes:  Negative for pain, discharge and itching.  Respiratory:  Negative for cough, shortness of breath and wheezing.   Cardiovascular:  Negative for chest pain, palpitations and leg swelling.  Gastrointestinal:  Negative for abdominal pain, constipation, nausea and vomiting.  Endocrine: Negative for cold intolerance, heat intolerance and polydipsia.  Genitourinary:  Negative for difficulty urinating, dysuria, flank pain and frequency.  Musculoskeletal:  Positive for arthralgias and back pain. Negative for joint swelling and myalgias.  Skin:  Negative for color change, pallor, rash and wound.  Allergic/Immunologic: Positive for environmental allergies. Negative for  immunocompromised state.  Neurological:  Negative for dizziness, facial asymmetry, weakness, numbness and headaches.  Psychiatric/Behavioral:  Positive for sleep disturbance. Negative for behavioral problems, confusion, self-injury and suicidal ideas.        Objective:     BP 105/76   Pulse 71   Temp (!) 97 F (36.1 C)   Wt 195 lb (88.5 kg)   SpO2 100%   BMI 25.04 kg/m    Physical Exam Vitals and nursing note reviewed.  Constitutional:      General: He is not in acute distress.    Appearance: Normal appearance. He is not ill-appearing, toxic-appearing or diaphoretic.  HENT:     Right Ear: Tympanic membrane, ear canal and external ear normal. There is no impacted cerumen.     Left Ear: Tympanic membrane, ear canal and external ear normal. There is no impacted cerumen.  Nose: Nose normal. No congestion or rhinorrhea.     Mouth/Throat:     Mouth: Mucous membranes are moist.     Pharynx: Oropharynx is clear. No oropharyngeal exudate or posterior oropharyngeal erythema.  Eyes:     General: No scleral icterus.       Right eye: No discharge.        Left eye: No discharge.     Extraocular Movements: Extraocular movements intact.     Conjunctiva/sclera: Conjunctivae normal.  Neck:     Vascular: No carotid bruit.  Cardiovascular:     Rate and Rhythm: Normal rate and regular rhythm.     Pulses: Normal pulses.     Heart sounds: Normal heart sounds. No murmur heard.    No friction rub. No gallop.  Pulmonary:     Effort: Pulmonary effort is normal. No respiratory distress.     Breath sounds: Normal breath sounds. No stridor. No wheezing, rhonchi or rales.  Chest:     Chest wall: No tenderness.  Abdominal:     General: Bowel sounds are normal. There is no distension.     Palpations: Abdomen is soft. There is no mass.     Tenderness: There is no abdominal tenderness. There is no right CVA tenderness, left CVA tenderness, guarding or rebound.     Hernia: No hernia is present.   Musculoskeletal:        General: Tenderness present. No swelling, deformity or signs of injury.     Cervical back: Normal range of motion and neck supple. No rigidity or tenderness.     Right lower leg: No edema.     Left lower leg: No edema.     Comments: Tenderness on palpation of low mid back,  Patient able to ambulate independently.  Has palpable pedal pulses  Lymphadenopathy:     Cervical: No cervical adenopathy.  Skin:    General: Skin is warm and dry.     Capillary Refill: Capillary refill takes less than 2 seconds.     Coloration: Skin is not jaundiced or pale.     Findings: No bruising, erythema, lesion or rash.  Neurological:     Mental Status: He is alert and oriented to person, place, and time.     Cranial Nerves: No cranial nerve deficit.     Sensory: No sensory deficit.     Motor: No weakness.     Coordination: Coordination normal.     Gait: Gait normal.     Deep Tendon Reflexes: Reflexes normal.  Psychiatric:        Mood and Affect: Mood normal.        Behavior: Behavior normal.        Thought Content: Thought content normal.        Judgment: Judgment normal.     Results for orders placed or performed in visit on 06/17/23  POCT glycosylated hemoglobin (Hb A1C)  Result Value Ref Range   Hemoglobin A1C 5.6 4.0 - 5.6 %   HbA1c POC (<> result, manual entry)     HbA1c, POC (prediabetic range)     HbA1c, POC (controlled diabetic range)         Assessment & Plan:    Routine Health Maintenance and Physical Exam  Immunization History  Administered Date(s) Administered   Influenza, Seasonal, Injecte, Preservative Fre 12/10/2022   Tdap 01/08/2020, 11/13/2021    Health Maintenance  Topic Date Due   Pneumococcal Vaccine 70-32 Years old (1 of 2 - PCV) Never done  Zoster Vaccines- Shingrix (1 of 2) Never done   COVID-19 Vaccine (1 - 2024-25 season) Never done   INFLUENZA VACCINE  10/04/2023   COLON CANCER SCREENING ANNUAL FOBT  11/07/2023   DTaP/Tdap/Td (3 -  Td or Tdap) 11/14/2031   Colonoscopy  11/06/2032   Hepatitis C Screening  Completed   HIV Screening  Completed   HPV VACCINES  Aged Out   Meningococcal B Vaccine  Aged Out    Discussed health benefits of physical activity, and encouraged him to engage in regular exercise appropriate for his age and condition.  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   Solu-Medrol 40 mg injection and Toradol 30 mg injection one-time dose given in the office today Short course of methylprednisolone ordered, he was encouraged to take medication with food Has a prescription for lidocaine patches but he never used the medication We discussed referral for physical therapy, referral placed Encouraged to take Tylenol 650 mg every 6 hours as needed      Relevant Medications   methylPREDNISolone (MEDROL DOSEPAK) 4 MG TBPK tablet   Other Relevant Orders   Ambulatory referral to Physical Therapy     Other   Insomnia   Home sleep study ordered Continue Seroquel 200 mg daily at bedtime he has stopped taking trazodone Sleep hygiene discussed Avoid alcohol at nights      Relevant Orders   Home sleep test   Polysubstance abuse (HCC)   Need to avoid use of illicit drugs discussed We discussed referral for substance abuse counseling but the patient declined      Mood disorder (HCC)   Has stopped taking depakote and trazaodone,  States that ''I am doing fine'' he is not interested in seeing a neuropsychiatrist at this time      Tobacco abuse   Smokes about  less than 0.5 pack/day  Asked about quitting: confirms that he/she currently smokes cigarettes Advise to  quit smoking: Educated about QUITTING to reduce the risk of cancer, cardio and cerebrovascular disease. Assess willingness: Unwilling to quit at this time, but is working on cutting back. Assist with counseling and pharmacotherapy: Counseled for 5 minutes and literature provided. Arrange for follow up: follow up in 6 months and continue to offer help.       Hyperlipidemia   Lab Results  Component Value Date   CHOL 225 (H) 12/10/2022   HDL 53 12/10/2022   LDLCALC 144 (H) 12/10/2022   TRIG 156 (H) 12/10/2022   CHOLHDL 4.2 12/10/2022  Currently on atorvastatin 10 mg daily Checking labs Avoid fatty fried foods      Relevant Orders   Lipid panel   Prediabetes   Lab Results  Component Value Date   HGBA1C 5.6 06/17/2023         Relevant Orders   POCT glycosylated hemoglobin (Hb A1C) (Completed)   Vitamin D deficiency   Relevant Orders   VITAMIN D 25 Hydroxy (Vit-D Deficiency, Fractures)   Seasonal allergies   Patient complains of sneezing, stuffy nose, runny nose Uses Flonase nasal spray 2 spray into both nostrils as needed Continue Flonase nasal  spray Start Claritin 10 mg daily Avoid allergens      Relevant Medications   loratadine (CLARITIN) 10 MG tablet   Hypomagnesemia   Was on magnesium supplement but not currently Checking labs      Relevant Orders   Magnesium   Annual physical exam - Primary   Annual exam as documented.  Counseling done include healthy lifestyle involving committing to 150 minutes of exercise per week, heart healthy diet, and attaining healthy weight. The importance of adequate sleep also discussed.  Regular use of seat belt and home safety were also discussed . Changes in health habits are decided on by patient with goals and time frames set for achieving them. Immunization and cancer screening  needs are specifically addressed at this visit.    Encouraged to get pneumococcal vaccine and shingles vaccine at the pharmacy       Relevant  Orders   CMP14+EGFR   CBC   PSA   Alcohol use   Patient encouraged to limit alcohol to no more than 2 drinks a day and even better to avoid alcohol.      Other Visit Diagnoses       Screening for endocrine, nutritional, metabolic and immunity disorder       Relevant Orders   CMP14+EGFR   CBC   PSA     Screening for lung cancer       Relevant Orders   CT CHEST LUNG CA SCREEN LOW DOSE W/O CM      Return in about 4 months (around 10/17/2023).     Esteen Delpriore R Tiari Andringa, FNP

## 2023-06-17 NOTE — Assessment & Plan Note (Signed)
Smokes about less than 0.5 pack/day  Asked about quitting: confirms that he/she currently smokes cigarettes Advise to quit smoking: Educated about QUITTING to reduce the risk of cancer, cardio and cerebrovascular disease. Assess willingness: Unwilling to quit at this time, but is working on cutting back. Assist with counseling and pharmacotherapy: Counseled for 5 minutes and literature provided. Arrange for follow up: follow up in 6 months and continue to offer help.

## 2023-06-17 NOTE — Assessment & Plan Note (Signed)
 Patient encouraged to limit alcohol to no more than 2 drinks a day and even better to avoid alcohol.

## 2023-06-17 NOTE — Assessment & Plan Note (Signed)
Smoking cessation encouraged Continue albuterol inhaler 2 puffs every 6 hours as needed

## 2023-06-17 NOTE — Assessment & Plan Note (Addendum)
 Home sleep study ordered Continue Seroquel 200 mg daily at bedtime he has stopped taking trazodone Sleep hygiene discussed Avoid alcohol at nights

## 2023-06-17 NOTE — Patient Instructions (Addendum)
 Please consider getting Shingrix and pneumococcal  vaccine at local pharmacy.   You were given Toradol 30 mg injection and Solu-Medrol 40 mg injection in the office today.  Please take methylprednisolone as instructed on the packaging.  Take medication with food.  I have referred you for physical therapy.  It is okay to take Tylenol 650 mg every 6 hours as needed, also encourage use of heating pad    . Seasonal allergies  - loratadine (CLARITIN) 10 MG tablet; Take 1 tablet (10 mg total) by mouth daily.  Dispense: 30 tablet; Refill: 5  . 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 - Maintain dark and non-noisy environment in the bedroom. - Please use the bedroom for sleep and sexual activity only. - Do not use electronic devices in the bedroom. - Please take dinner at least 2 hours before bedtime. - Please avoid caffeinated products in the evening, including coffee, soft drinks. - Please try to maintain the regular sleep-wake cycle - Go to bed and wake up at the same time.    It is important that you exercise regularly at least 30 minutes 5 times a week as tolerated  Think about what you will eat, plan ahead. Choose " clean, green, fresh or frozen" over canned, processed or packaged foods which are more sugary, salty and fatty. 70 to 75% of food eaten should be vegetables and fruit. Three meals at set times with snacks allowed between meals, but they must be fruit or vegetables. Aim to eat over a 12 hour period , example 7 am to 7 pm, and STOP after  your last meal of the day. Drink water,generally about 64 ounces per day, no other  drink is as healthy. Fruit juice is best enjoyed in a healthy way, by EATING the fruit.  Thanks for choosing [facility] we consider it a privelige to serve you.

## 2023-06-17 NOTE — Assessment & Plan Note (Signed)
 Patient complains of sneezing, stuffy nose, runny nose Uses Flonase nasal spray 2 spray into both nostrils as needed Continue Flonase nasal spray Start Claritin 10 mg daily Avoid allergens

## 2023-06-17 NOTE — Assessment & Plan Note (Signed)
 Was on magnesium supplement but not currently Checking labs

## 2023-06-17 NOTE — Assessment & Plan Note (Signed)
 Solu-Medrol 40 mg injection and Toradol 30 mg injection one-time dose given in the office today Short course of methylprednisolone ordered, he was encouraged to take medication with food Has a prescription for lidocaine patches but he never used the medication We discussed referral for physical therapy, referral placed Encouraged to take Tylenol 650 mg every 6 hours as needed

## 2023-06-17 NOTE — Assessment & Plan Note (Signed)
 Has stopped taking depakote and trazaodone,  States that ''I am doing fine'' he is not interested in seeing a neuropsychiatrist at this time

## 2023-06-18 ENCOUNTER — Other Ambulatory Visit: Payer: Self-pay | Admitting: Nurse Practitioner

## 2023-06-18 DIAGNOSIS — E785 Hyperlipidemia, unspecified: Secondary | ICD-10-CM

## 2023-06-18 LAB — CBC
Hematocrit: 44.5 % (ref 37.5–51.0)
Hemoglobin: 14 g/dL (ref 13.0–17.7)
MCH: 27.5 pg (ref 26.6–33.0)
MCHC: 31.5 g/dL (ref 31.5–35.7)
MCV: 87 fL (ref 79–97)
Platelets: 285 10*3/uL (ref 150–450)
RBC: 5.09 x10E6/uL (ref 4.14–5.80)
RDW: 14.3 % (ref 11.6–15.4)
WBC: 6.7 10*3/uL (ref 3.4–10.8)

## 2023-06-18 LAB — PSA: Prostate Specific Ag, Serum: 0.7 ng/mL (ref 0.0–4.0)

## 2023-06-18 LAB — CMP14+EGFR
ALT: 22 IU/L (ref 0–44)
AST: 32 IU/L (ref 0–40)
Albumin: 4.5 g/dL (ref 3.9–4.9)
Alkaline Phosphatase: 92 IU/L (ref 44–121)
BUN/Creatinine Ratio: 14 (ref 10–24)
BUN: 19 mg/dL (ref 8–27)
Bilirubin Total: 0.2 mg/dL (ref 0.0–1.2)
CO2: 21 mmol/L (ref 20–29)
Calcium: 9.6 mg/dL (ref 8.6–10.2)
Chloride: 102 mmol/L (ref 96–106)
Creatinine, Ser: 1.38 mg/dL — ABNORMAL HIGH (ref 0.76–1.27)
Globulin, Total: 3.4 g/dL (ref 1.5–4.5)
Glucose: 87 mg/dL (ref 70–99)
Potassium: 4.9 mmol/L (ref 3.5–5.2)
Sodium: 137 mmol/L (ref 134–144)
Total Protein: 7.9 g/dL (ref 6.0–8.5)
eGFR: 57 mL/min/{1.73_m2} — ABNORMAL LOW (ref >59)

## 2023-06-18 LAB — MAGNESIUM: Magnesium: 2.3 mg/dL (ref 1.6–2.3)

## 2023-06-18 LAB — LIPID PANEL
Chol/HDL Ratio: 3 ratio (ref 0.0–5.0)
Cholesterol, Total: 208 mg/dL — ABNORMAL HIGH (ref 100–199)
HDL: 69 mg/dL (ref >39)
LDL Chol Calc (NIH): 124 mg/dL — ABNORMAL HIGH (ref 0–99)
Triglycerides: 84 mg/dL (ref 0–149)
VLDL Cholesterol Cal: 15 mg/dL (ref 5–40)

## 2023-06-18 LAB — VITAMIN D 25 HYDROXY (VIT D DEFICIENCY, FRACTURES): Vit D, 25-Hydroxy: 16.3 ng/mL — ABNORMAL LOW (ref 30.0–100.0)

## 2023-06-18 MED ORDER — MINERIN CREME EX CREA: 1.0000 | TOPICAL_CREAM | Freq: Every day | CUTANEOUS | 10 refills | Status: DC

## 2023-06-18 MED ORDER — ATORVASTATIN CALCIUM 20 MG PO TABS: 20.0000 mg | ORAL_TABLET | Freq: Every day | ORAL | 1 refills | Status: DC

## 2023-07-02 ENCOUNTER — Ambulatory Visit: Admitting: Physician Assistant

## 2023-07-03 ENCOUNTER — Ambulatory Visit: Admitting: Physician Assistant

## 2023-07-10 ENCOUNTER — Ambulatory Visit: Admitting: Physician Assistant

## 2023-08-13 ENCOUNTER — Ambulatory Visit (HOSPITAL_BASED_OUTPATIENT_CLINIC_OR_DEPARTMENT_OTHER): Admitting: Internal Medicine

## 2023-08-20 ENCOUNTER — Ambulatory Visit (HOSPITAL_BASED_OUTPATIENT_CLINIC_OR_DEPARTMENT_OTHER): Attending: Nurse Practitioner | Admitting: Internal Medicine

## 2023-08-20 DIAGNOSIS — G4733 Obstructive sleep apnea (adult) (pediatric): Secondary | ICD-10-CM | POA: Insufficient documentation

## 2023-08-20 DIAGNOSIS — G4700 Insomnia, unspecified: Secondary | ICD-10-CM | POA: Insufficient documentation

## 2023-08-21 ENCOUNTER — Encounter (HOSPITAL_BASED_OUTPATIENT_CLINIC_OR_DEPARTMENT_OTHER): Admitting: Internal Medicine

## 2023-08-24 DIAGNOSIS — G4700 Insomnia, unspecified: Secondary | ICD-10-CM

## 2023-08-24 NOTE — Procedures (Signed)
 [facility] [address] [place], [place] [address] Tel: [phone]   Fax: [phone]  Home Sleep Test Interpretation  Patient Name: Bill Brooks, Bill Brooks. Study Date: 08/20/2023  Date of Birth: 20-Jul-1960 Study Type: HST  Age: 63 year MRN #: [id number]  Sex: Male Interpreting Physician: NEYSA RAMA, [id number]  Height: 6' 2 Referring Physician: Foleshade R. Paseda, FNP  Weight: 195.0 lbs Recording Tech: Holly Neeriemer RPSGT RST  BMI: 25.0 Scoring Tech: Will Poet RRT RPSGT RST  ESS: 13 Neck Size: 15.75   Indications for Polysomnography The patient is a 63 year-old Male who is 6' 2 and weighs 195.0 lbs. His BMI equals 25.0.  A home sleep apnea test was performed to evaluate for -.  Medication  No Data.   Polysomnogram Data A home sleep test recorded the standard physiologic parameters including EKG, nasal and oral airflow.  Respiratory parameters of chest and abdominal movements were recorded with Respiratory Inductance Plethysmography belts.  Oxygen saturation was recorded by pulse oximetry.   Study Architecture The total recording time of the polysomnogram was 375.0 minutes.  The total monitoring time was 375.5 minutes.  Time spent in Supine position was 84.5 minutes.   Respiratory Events The study revealed a presence of 25 obstructive, - central, and - mixed apneas resulting in an Apnea index of 4.0 events per hour.  There were 60 hypopneas (>=3% desaturation and/or arousal) resulting in an Apnea\Hypopnea Index (AHI >=3% desaturation and/or arousal) of 13.6 events per hour.  There were 45 hypopneas (>=4% desaturation) resulting in an Apnea\Hypopnea Index (AHI >=4% desaturation) of 11.2 events per hour.  There were - Respiratory Effort Related Arousals resulting in a RERA index of - events per hour. The Respiratory Disturbance Index is 13.6 events per hour.  The snore index was - events per hour.  Mean oxygen saturation was 94.8%.  The  lowest oxygen saturation during monitoring time was 76.0%.  Time spent <=88% oxygen saturation was 12.6 minutes (3.5%).  Cardiac Summary The average pulse rate was 81.5 bpm.  The minimum pulse rate was 62.0 bpm while the maximum pulse rate was 106.0 bpm.  Cardiac rhythm was normal.  Comment: Mild obstructive sleep apnea, AHI (4%) 11.2/hr. Snoring with oxygen desaturation to a nadir of 76  %, mean 94.8%.  Diagnosis: Obstructive sleep apnea  Recommendations:  Suggest Autopap, CPAP titration sleep study or fitted oral appliance.   This study was personally reviewed and electronically signed by: RAMA NEYSA, MD Accredited Board Certified in Sleep Medicine Date/Time: 08/24/23 11:12   Study Overview  Recording Time: 560.7 min. Monitoring Time: 375.5 min.  Analysis Start:  11:28:40 PM Supine Time: 84.5 min.  Analysis Stop:  05:43:41 AM     Study Summary   Count Index Longest Event Duration  Apneas & Hypopneas: 85 13.6  Apneas: 37.3 sec.     Hypopneas: 62.0 sec.  RERAs: - - - sec.  Desaturations: 73 11.7 67.0 sec.  Snores: - - - sec.    Minimum Oxygen Saturation: 76.0%    Respiratory Summary   Total Duration Supine Non-Supine   Count Index Average Longest Count Index Count Index  Obstructive Apnea 25 4.0 20.6 37.3 5 3.6 20 4.1   Mixed Apnea - - - - - - - -   Central Apnea - - - - - - - -   Total Apneas 25 4.0 20.6 37.3 5 3.6 20 4.1  Hypopneas 3% 60 9.6 N.A. N.A. 31 22.0 29 6.0   Apneas & Hyp. 3% 85 13.6 N.A. N.A. 36 25.6 49 10.1            Hypopneas 4% 45 7.2 N.A. N.A. 31 22.0 14 2.9  Apneas & Hyp. 4% 70 11.2 N.A. N.A. 36 25.6 34 7.0             RERAs - - - - - - - -  RDI 85 13.6 N.A. N.A. 36 25.6 49 10.1   Oxygen Saturation Summary   Total Supine Non-Supine  Average SpO2 94.8% 92.4% 95.5%  Minimum SpO2 76.0% 76.0% 78.0%   Maximum SpO2 100.0% 99.0% 100.0%   Oxygen Saturation Distribution  Range (%) Time in range (min) Time in range (%)  90.0 - 100.0  344.3 95.0%  80.0 - 90.0 16.5 4.6%  70.0 - 80.0 1.8 0.5%  60.0 - 70.0 - -  50.0 - 60.0 - -  0.0 - 50.0 - -  Time Spent <=88% SpO2  Range (%) Time in range (min) Time in range (%)  0.0 - 88.0 12.6 3.5%  Cardiac Summary   Total Supine Non-Supine  Average Pulse Rate (BPM) 81.5 79.0 82.3  Minimum Pulse Rate (BPM) 62.0 62.0 64.0  Maximum Pulse Rate (BPM) 106.0 99.0 106.0                      Technologist Comments  -                        Reggy Neysa Bateman, Biomedical engineer of Sleep Medicine  ELECTRONICALLY SIGNED ON:  08/24/2023, 11:13 AM [facility] PH: [phone]   FX: [phone] ACCREDITED BY THE AMERICAN ACADEMY OF SLEEP MEDICINE

## 2023-08-29 ENCOUNTER — Other Ambulatory Visit: Payer: Self-pay

## 2023-08-29 DIAGNOSIS — G4700 Insomnia, unspecified: Secondary | ICD-10-CM

## 2023-08-29 NOTE — Progress Notes (Signed)
 Sent to [facility]. KH

## 2023-10-18 ENCOUNTER — Ambulatory Visit: Payer: Self-pay | Admitting: Nurse Practitioner

## 2023-11-14 ENCOUNTER — Other Ambulatory Visit: Payer: Self-pay

## 2023-11-14 ENCOUNTER — Telehealth: Payer: Self-pay

## 2023-11-14 NOTE — Telephone Encounter (Signed)
 Copied from CRM [id number]. Topic: Clinical - Medication Question >> Nov 14, 2023  9:17 AM Leonette SQUIBB wrote: Reason for CRM: pts sister Loel Betancur called asking if a nurse would call her back about her brothers medications.    [phone]  Pt was made an appt. Kh

## 2023-11-14 NOTE — Telephone Encounter (Signed)
Pt was made an appt. Kh

## 2023-11-15 ENCOUNTER — Ambulatory Visit: Payer: Self-pay | Admitting: Nurse Practitioner

## 2023-11-22 ENCOUNTER — Encounter: Payer: Self-pay | Admitting: Nurse Practitioner

## 2023-11-22 ENCOUNTER — Ambulatory Visit (INDEPENDENT_AMBULATORY_CARE_PROVIDER_SITE_OTHER): Payer: Self-pay | Admitting: Nurse Practitioner

## 2023-11-22 VITALS — BP 111/77 | HR 80 | Wt 187.0 lb

## 2023-11-22 DIAGNOSIS — F1090 Alcohol use, unspecified, uncomplicated: Secondary | ICD-10-CM | POA: Diagnosis not present

## 2023-11-22 DIAGNOSIS — M5441 Lumbago with sciatica, right side: Secondary | ICD-10-CM

## 2023-11-22 DIAGNOSIS — G8929 Other chronic pain: Secondary | ICD-10-CM

## 2023-11-22 DIAGNOSIS — G4733 Obstructive sleep apnea (adult) (pediatric): Secondary | ICD-10-CM

## 2023-11-22 DIAGNOSIS — G4700 Insomnia, unspecified: Secondary | ICD-10-CM

## 2023-11-22 DIAGNOSIS — Z720 Tobacco use: Secondary | ICD-10-CM

## 2023-11-22 DIAGNOSIS — E785 Hyperlipidemia, unspecified: Secondary | ICD-10-CM

## 2023-11-22 DIAGNOSIS — J302 Other seasonal allergic rhinitis: Secondary | ICD-10-CM

## 2023-11-22 DIAGNOSIS — K219 Gastro-esophageal reflux disease without esophagitis: Secondary | ICD-10-CM

## 2023-11-22 DIAGNOSIS — J439 Emphysema, unspecified: Secondary | ICD-10-CM | POA: Diagnosis not present

## 2023-11-22 MED ORDER — NICOTINE 14 MG/24HR TD PT24: 14.0000 mg | MEDICATED_PATCH | Freq: Every day | TRANSDERMAL | 0 refills | Status: DC

## 2023-11-22 MED ORDER — FAMOTIDINE 20 MG PO TABS: 20.0000 mg | ORAL_TABLET | Freq: Two times a day (BID) | ORAL | 3 refills | Status: AC

## 2023-11-22 MED ORDER — KETOROLAC TROMETHAMINE 30 MG/ML IJ SOLN
30.0000 mg | Freq: Once | INTRAMUSCULAR | Status: AC
  Administered 2023-11-22: 30 mg via INTRAMUSCULAR

## 2023-11-22 MED ORDER — VENTOLIN HFA 108 (90 BASE) MCG/ACT IN AERS: 2.0000 | INHALATION_SPRAY | Freq: Four times a day (QID) | RESPIRATORY_TRACT | 5 refills | Status: DC | PRN

## 2023-11-22 MED ORDER — METHYLPREDNISOLONE 4 MG PO TBPK: ORAL_TABLET | ORAL | 0 refills | Status: DC

## 2023-11-22 MED ORDER — ATORVASTATIN CALCIUM 20 MG PO TABS: 20.0000 mg | ORAL_TABLET | Freq: Every day | ORAL | 1 refills | Status: DC

## 2023-11-22 MED ORDER — VENTOLIN HFA 108 (90 BASE) MCG/ACT IN AERS: 2.0000 | INHALATION_SPRAY | Freq: Four times a day (QID) | RESPIRATORY_TRACT | 5 refills | Status: AC | PRN

## 2023-11-22 MED ORDER — QUETIAPINE FUMARATE 200 MG PO TABS: 200.0000 mg | ORAL_TABLET | Freq: Every day | ORAL | 10 refills | Status: DC

## 2023-11-22 MED ORDER — METHYLPREDNISOLONE NA SUC (PF) 40 MG IJ SOLR
40.0000 mg | Freq: Once | INTRAMUSCULAR | Status: AC
  Administered 2023-11-22: 40 mg via INTRAMUSCULAR

## 2023-11-22 MED ORDER — LORATADINE 10 MG PO TABS: 10.0000 mg | ORAL_TABLET | Freq: Every day | ORAL | 5 refills | Status: AC

## 2023-11-22 MED ORDER — TIZANIDINE HCL 2 MG PO CAPS: 2.0000 mg | ORAL_CAPSULE | Freq: Three times a day (TID) | ORAL | 1 refills | Status: DC | PRN

## 2023-11-22 MED ORDER — TIZANIDINE HCL 2 MG PO TABS
2.0000 mg | ORAL_TABLET | Freq: Three times a day (TID) | ORAL | 0 refills | Status: AC | PRN
Start: 2023-11-22 — End: ?

## 2023-11-22 NOTE — Assessment & Plan Note (Signed)
 Emphysema Chronic cough and wheezing likely due to emphysema and tobacco use. No signs of acute infection. - Refill Ventolin  inhaler.  2 puffs every 6 hours as needed refilled Smoking cessation encouraged - Order chest CT scan but has not been completed, advised to follow-up on the order

## 2023-11-22 NOTE — Assessment & Plan Note (Signed)
 Smokes about 0.5 pack/day  Asked about quitting: confirms that he/ currently smokes cigarettes Advise to quit smoking: Educated about QUITTING to reduce the risk of cancer, cardio and cerebrovascular disease. Assess willingness: willing to quit at this time,  Assist with counseling and pharmacotherapy: Counseled for 5 minutes and literature provided. Arrange for follow up: follow up in 6 weeks and continue to offer help.  - Order nicotine  patches (14 mg).

## 2023-11-22 NOTE — Patient Instructions (Addendum)
 You were given Toradol  30 mg injection and Solu-Medrol  40 mg injection in the office today for your chronic back pain Please take Medrol  dose pack as prescribed, take with breakfast.  Do not use Aleve  or ibuprofen  while taking this medication  Take tizanidine  2 mg 3 times daily as needed muscle spasm, continue Tylenol  650 mg every 6 hours as needed.  I have referred you to orthopedics  Please come fasting for your appointment in 6 weeks   1. Allergy, initial encounter   2. Gastroesophageal reflux disease, unspecified whether esophagitis present  - famotidine  (PEPCID ) 20 MG tablet; Take 1 tablet (20 mg total) by mouth 2 (two) times daily.  Dispense: 60 tablet; Refill: 3  3. Pulmonary emphysema, unspecified emphysema type (HCC) (Primary)   4. Alcohol use   5. 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  Dispense: 30 capsule; Refill: 1 - methylPREDNISolone  (MEDROL  DOSEPAK) 4 MG TBPK tablet; Take as instructed on the packaging  Dispense: 1 each; Refill: 0 - Ambulatory referral to Orthopedic Surgery  6. OSA (obstructive sleep apnea)   7. Insomnia, unspecified type  - QUEtiapine  (SEROQUEL ) 200 MG tablet; Take 1 tablet (200 mg total) by mouth at bedtime.  Dispense: 30 tablet; Refill: 10  8. Tobacco abuse  - nicotine  (NICODERM CQ  - DOSED IN MG/24 HOURS) 14 mg/24hr patch; Place 1 patch (14 mg total) onto the skin daily.  Dispense: 42 patch; Refill: 0  9. Dyslipidemia, goal LDL below 100  - Basic Metabolic Panel; Future - Lipid panel; Future - atorvastatin  (LIPITOR) 20 MG tablet; Take 1 tablet (20 mg total) by mouth daily.  Dispense: 90 tablet; Refill: 1     It is important that you exercise regularly at least 30 minutes 5 times a week as tolerated  Think about what you will eat, plan ahead. Choose  clean, green, fresh or frozen over  canned, processed or packaged foods which are more sugary, salty and fatty. 70 to 75% of food eaten should be vegetables and fruit. Three meals at set times with snacks allowed between meals, but they must be fruit or vegetables. Aim to eat over a 12 hour period , example 7 am to 7 pm, and STOP after  your last meal of the day. Drink water ,generally about 64 ounces per day, no other drink is as healthy. Fruit juice is best enjoyed in a healthy way, by EATING the fruit.  Thanks for choosing [facility] we consider it a privelige to serve you.

## 2023-11-22 NOTE — Assessment & Plan Note (Signed)
 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 Persistent pain disrupts sleep. Previous treatments insufficient. - Switch to tizanidine  2 mg 3 times daily as needed due to age-related side effects of Flexeril . - Administer Solu-Medro 40 mg injection l and Toradol  30 mg injection in the office today  - Refer to orthopedics. - Recommend heating pad.diclofenac  gel and lidocaine  patch, get medication over-the-counter

## 2023-11-22 NOTE — Progress Notes (Signed)
 Established Patient Office Visit  Subjective:  Patient ID: Bill Nichelson., male    DOB: 02/24/1961  Age: 63 y.o. MRN: [id number]  CC:  Chief Complaint  Patient presents with  . Medical Management of Chronic Issues    HPI  Discussed the use of AI scribe software for clinical note transcription with the patient, who gave verbal consent to proceed.  History of Present Illness Bill Hollibaugh. is a 63 year old male  has a past medical history of Alcohol abuse with intoxication (HCC), Anxiety, Arthritis, Blood transfusion without reported diagnosis (2023), Depression, GERD (gastroesophageal reflux disease), Insomnia, Mood disorder (HCC), Seasonal allergies, Substance abuse (HCC), TBI (traumatic brain injury) (HCC), and TBI (traumatic brain injury) (HCC).  who presents for medication refills and management of chronic back pain and allergies. He is accompanied by his sister.  He experiences chronic back pain, rated as 9 out of 10 in severity, which disrupts his sleep and wakes him at night. Tylenol  provides insufficient relief, and he has been out of his prescribed muscle relaxant, Flexeril , and diclofenac  gel. Previous injections provided minimal relief.  He has a history of allergies and uses loratadine  10 mg daily and Flonase  nasal spray, both of which he has run out of. He reports a head cold and chronic cough that started with the change in weather, accompanied by sniffles and occasional wheezing. No fever, chills, chest pain, or shortness of breath. He has been out of his Ventolin  inhaler, which he uses for wheezing.  Recently diagnosed with obstructive sleep apnea, needs a CPAP ,he reports difficulty sleeping and has been out of Seroquel , which he uses for sleep.  He has a history of low vitamin D  levels and has recently purchased vitamin D  supplements but has not been taking them regularly. He also takes Senokot as needed for constipation and Flomax .  He drinks about two beers a day and  has reduced his smoking to about half a pack every three to four days. He started smoking at age 63 or 58 and has previously used nicotine  patches to aid smoking cessation. He has not been taking thiamine , which was previously prescribed due to a history of heavy alcohol use, which he has since reduced.  Assessment & Plan       Past Medical History:  Diagnosis Date  . Alcohol abuse with intoxication (HCC)   . Anxiety    on meds  . Arthritis    generalized  . Blood transfusion without reported diagnosis 2023  . Depression    on meds  . GERD (gastroesophageal reflux disease)    OTC PRN meds/with certain foods  . Insomnia   . Mood disorder (HCC)   . Seasonal allergies   . Substance abuse (HCC)   . TBI (traumatic brain injury) (HCC)   . TBI (traumatic brain injury) [facility])     Past Surgical History:  Procedure Laterality Date  . IR ANGIOGRAM VISCERAL SELECTIVE  11/13/2021  . IR ANGIOGRAM VISCERAL SELECTIVE  11/13/2021  . IR ANGIOGRAM VISCERAL SELECTIVE  11/13/2021  . IR EMBO ART  VEN HEMORR LYMPH EXTRAV  INC GUIDE ROADMAPPING  11/13/2021  . IR GASTR TUBE CONVERT GASTR-JEJ PER W/FL MOD SED  12/08/2021  . IR GASTR TUBE CONVERT GASTR-JEJ PER W/FL MOD SED  12/19/2021  . IR RADIOLOGIST EVAL & MGMT  12/26/2021  . IR US  GUIDE VASC ACCESS RIGHT  11/13/2021  . ORIF CLAVICULAR FRACTURE Right 11/20/2021   Procedure: OPEN REDUCTION INTERNAL FIXATION (ORIF) CLAVICULAR  FRACTURE;  Surgeon: Celena Sharper, MD;  Location: [facility] OR;  Service: Orthopedics;  Laterality: Right;  . ORIF HUMERUS FRACTURE Right 11/20/2021   Procedure: OPEN REDUCTION INTERNAL FIXATION (ORIF) HUMERAL SHAFT FRACTURE;  Surgeon: Celena Sharper, MD;  Location: [facility] OR;  Service: Orthopedics;  Laterality: Right;  . ORIF SHOULDER FRACTURE Left 11/20/2021   Procedure: OPEN REDUCTION INTERNAL FIXATION (ORIF) FRACTURE OF THE LEFT CHOROCOID PROCESS;  Surgeon: Celena Sharper, MD;  Location: [facility] OR;  Service: Orthopedics;  Laterality: Left;  .  ORIF SHOULDER FRACTURE Left 11/20/2021   Procedure: OPEN REDUCTION INTERNAL FIXATION (ORIF) LEFT ACROMIUM;  Surgeon: Celena Sharper, MD;  Location: [facility] OR;  Service: Orthopedics;  Laterality: Left;  . PEG PLACEMENT N/A 11/30/2021   Procedure: PERCUTANEOUS ENDOSCOPIC GASTROSTOMY (PEG) PLACEMENT;  Surgeon: Sebastian Moles, MD;  Location: [facility] OR;  Service: General;  Laterality: N/A;  . RADIOLOGY WITH ANESTHESIA N/A 11/13/2021   Procedure: IR WITH ANESTHESIA;  Surgeon: Hughes Simmonds, MD;  Location: [facility] OR;  Service: Radiology;  Laterality: N/A;  . TRACHEOSTOMY TUBE PLACEMENT N/A 11/30/2021   Procedure: TRACHEOSTOMY;  Surgeon: Sebastian Moles, MD;  Location: [facility] OR;  Service: General;  Laterality: N/A;    Family History  Problem Relation Age of Onset  . Colon polyps Mother 7  . Colon polyps Father 62  . Diabetes Father   . Colon polyps Sister 47  . Diabetes Sister   . Colon cancer Neg Hx   . Esophageal cancer Neg Hx   . Rectal cancer Neg Hx   . Stomach cancer Neg Hx     Social History   Socioeconomic History  . Marital status: Single    Spouse name: Not on file  . Number of children: Not on file  . Years of education: Not on file  . Highest education level: Not on file  Occupational History  . Not on file  Tobacco Use  . Smoking status: Every Day    Current packs/day: 0.25    Types: Cigarettes  . Smokeless tobacco: Never  Vaping Use  . Vaping status: Never Used  Substance and Sexual Activity  . Alcohol use: Yes    Alcohol/week: 4.0 standard drinks of alcohol    Types: 4 Cans of beer per week    Comment: 2 beer daily , one drink of liquor daily  . Drug use: Yes    Types: Cocaine   . Sexual activity: Yes  Other Topics Concern  . Not on file  Social History Narrative   Lives with his sister.    Social Drivers of Corporate investment banker Strain: Not on file  Food Insecurity: No Food Insecurity (11/22/2023)   Hunger Vital Sign   . Worried About Programme researcher, broadcasting/film/video in the Last  Year: Never true   . Ran Out of Food in the Last Year: Never true  Transportation Needs: No Transportation Needs (11/22/2023)   PRAPARE - Transportation   . Lack of Transportation (Medical): No   . Lack of Transportation (Non-Medical): No  Physical Activity: Not on file  Stress: Not on file  Social Connections: Not on file  Intimate Partner Violence: Not At Risk (11/22/2023)   Humiliation, Afraid, Rape, and Kick questionnaire   . Fear of Current or Ex-Partner: No   . Emotionally Abused: No   . Physically Abused: No   . Sexually Abused: No    Outpatient Medications Prior to Visit  Medication Sig Dispense Refill  . acetaminophen  (TYLENOL ) 325 MG tablet Take 1-2 tablets (325-650  mg total) by mouth every 6 (six) hours as needed for mild pain. 100 tablet 0  . diclofenac  Sodium (VOLTAREN ) 1 % GEL Apply 4 g topically 4 (four) times daily. 400 g 0  . fluticasone  (FLONASE ) 50 MCG/ACT nasal spray INSTILL 2 SPRAYS IN EACH NOSTRIL DAILY 16 g 10  . guaiFENesin  (MUCINEX ) 600 MG 12 hr tablet Take 1 tablet (600 mg total) by mouth 2 (two) times daily. 60 tablet 3  . loratadine  (CLARITIN ) 10 MG tablet Take 1 tablet (10 mg total) by mouth daily. 30 tablet 5  . polyethylene glycol powder (GLYCOLAX /MIRALAX ) 17 GM/SCOOP powder Take 17 g by mouth daily. 238 g 0  . senna-docusate (SENOKOT-S) 8.6-50 MG tablet Take 2 tablets by mouth daily at 6 (six) AM. 180 tablet 0  . Skin Protectants, Misc. (MINERIN CREME) CREA Apply 1 Application topically daily. 113 g 10  . tamsulosin  (FLOMAX ) 0.4 MG CAPS capsule Take 1 capsule (0.4 mg total) by mouth daily after supper. 90 capsule 0  . thiamine  (VITAMIN B1) 100 MG tablet Take 1 tablet (100 mg total) by mouth daily. 90 tablet 0  . atorvastatin  (LIPITOR) 20 MG tablet Take 1 tablet (20 mg total) by mouth daily. 60 tablet 1  . cyclobenzaprine  (FLEXERIL ) 5 MG tablet Take 1 tablet (5 mg total) by mouth 3 (three) times daily as needed for muscle spasms. 30 tablet 1  . folic acid   (FOLVITE ) 1 MG tablet Take 1 tablet (1 mg total) by mouth daily. (Patient not taking: Reported on 11/22/2023) 90 tablet 0  . lidocaine  (LIDODERM ) 5 % Place 1 patch onto the skin daily. Remove & Discard patch within 12 hours or as directed by MD (Patient not taking: Reported on 11/22/2023) 30 patch 0  . propranolol  (INDERAL ) 10 MG tablet Take 1 tablet (10 mg total) by mouth 2 (two) times daily. (Patient not taking: Reported on 11/22/2023) 180 tablet 0  . vitamin D3 (CHOLECALCIFEROL ) 25 MCG tablet Take 1 tablet (1,000 Units total) by mouth daily. (Patient not taking: Reported on 11/22/2023) 30 tablet 0  . famotidine  (PEPCID ) 20 MG tablet Take 1 tablet (20 mg total) by mouth 2 (two) times daily. (Patient not taking: Reported on 06/17/2023) 60 tablet 3  . methylPREDNISolone  (MEDROL  DOSEPAK) 4 MG TBPK tablet Take as instructed on the packaging (Patient not taking: Reported on 11/22/2023) 1 each 0  . Multiple Vitamin (MULTIVITAMIN WITH MINERALS) TABS tablet Take 1 tablet by mouth daily. (Patient not taking: Reported on 11/22/2023) 30 tablet 0  . Multiple Vitamins-Minerals (CERTAVITE/ANTIOXIDANTS) TABS Take 1 tablet by mouth daily. Take one tablet by mouth daily (Patient not taking: Reported on 11/22/2023)    . QUEtiapine  (SEROQUEL ) 200 MG tablet TAKE ONE TABLET BY MOUTH AT BEDTIME (Patient not taking: Reported on 11/22/2023) 30 tablet 10  . VENTOLIN  HFA 108 (90 Base) MCG/ACT inhaler INHALE TWO PUFFS BY MOUTH EVERY 6 HOURS AS NEEDED FOR WHEEZING OR SHORTNESS OF BREATH (Patient not taking: Reported on 11/22/2023) 18 g 10   No facility-administered medications prior to visit.    Allergies  Allergen Reactions  . Seasonal Ic [Octacosanol]     ROS Review of Systems  Constitutional:  Negative for appetite change, chills, fatigue and fever.  HENT:  Negative for congestion, postnasal drip, rhinorrhea and sneezing.   Respiratory:  Positive for cough. Negative for shortness of breath and wheezing.   Cardiovascular:   Negative for chest pain, palpitations and leg swelling.  Gastrointestinal:  Negative for abdominal pain, constipation, nausea and vomiting.  Genitourinary:  Negative for difficulty urinating, dysuria, flank pain and frequency.  Musculoskeletal:  Positive for arthralgias and back pain. Negative for joint swelling and myalgias.  Skin:  Negative for color change, pallor, rash and wound.  Neurological:  Negative for dizziness, facial asymmetry, weakness, numbness and headaches.  Psychiatric/Behavioral:  Negative for behavioral problems, confusion, self-injury and suicidal ideas.       Objective:    Physical Exam Vitals and nursing note reviewed.  Constitutional:      General: He is not in acute distress.    Appearance: Normal appearance. He is not ill-appearing, toxic-appearing or diaphoretic.  HENT:     Mouth/Throat:     Mouth: Mucous membranes are moist.     Pharynx: Oropharynx is clear. No oropharyngeal exudate or posterior oropharyngeal erythema.  Eyes:     General: No scleral icterus.       Right eye: No discharge.        Left eye: No discharge.     Extraocular Movements: Extraocular movements intact.     Conjunctiva/sclera: Conjunctivae normal.  Cardiovascular:     Rate and Rhythm: Normal rate and regular rhythm.     Pulses: Normal pulses.     Heart sounds: Normal heart sounds. No murmur heard.    No friction rub. No gallop.  Pulmonary:     Effort: Pulmonary effort is normal. No respiratory distress.     Breath sounds: No stridor. No wheezing, rhonchi or rales.     Comments: Diminished lung sounds bilaterally Chest:     Chest wall: No tenderness.  Abdominal:     General: There is no distension.     Palpations: Abdomen is soft.     Tenderness: There is no abdominal tenderness. There is no right CVA tenderness, left CVA tenderness or guarding.  Musculoskeletal:        General: Tenderness present. No swelling, deformity or signs of injury.     Right lower leg: No edema.      Left lower leg: No edema.     Comments: Tenderness on palpation of lower back  Skin:    General: Skin is warm and dry.     Capillary Refill: Capillary refill takes less than 2 seconds.     Coloration: Skin is not jaundiced or pale.     Findings: No bruising, erythema or lesion.  Neurological:     Mental Status: He is alert and oriented to person, place, and time.     Motor: No weakness.     Gait: Gait normal.  Psychiatric:        Mood and Affect: Mood normal.        Behavior: Behavior normal.        Thought Content: Thought content normal.        Judgment: Judgment normal.     BP 111/77   Pulse 80   Wt 187 lb (84.8 kg)   SpO2 98%   BMI 24.01 kg/m  Wt Readings from Last 3 Encounters:  11/22/23 187 lb (84.8 kg)  06/17/23 195 lb (88.5 kg)  12/10/22 196 lb 6.4 oz (89.1 kg)    Lab Results  Component Value Date   TSH 2.190 02/28/2022   Lab Results  Component Value Date   WBC 6.7 06/17/2023   HGB 14.0 06/17/2023   HCT 44.5 06/17/2023   MCV 87 06/17/2023   PLT 285 06/17/2023   Lab Results  Component Value Date   NA 137 06/17/2023   K 4.9 06/17/2023  CO2 21 06/17/2023   GLUCOSE 87 06/17/2023   BUN 19 06/17/2023   CREATININE 1.38 (H) 06/17/2023   BILITOT 0.2 06/17/2023   ALKPHOS 92 06/17/2023   AST 32 06/17/2023   ALT 22 06/17/2023   PROT 7.9 06/17/2023   ALBUMIN  4.5 06/17/2023   CALCIUM  9.6 06/17/2023   ANIONGAP 8 03/26/2022   EGFR 57 (L) 06/17/2023   Lab Results  Component Value Date   CHOL 208 (H) 06/17/2023   Lab Results  Component Value Date   HDL 69 06/17/2023   Lab Results  Component Value Date   LDLCALC 124 (H) 06/17/2023   Lab Results  Component Value Date   TRIG 84 06/17/2023   Lab Results  Component Value Date   CHOLHDL 3.0 06/17/2023   Lab Results  Component Value Date   HGBA1C 5.6 06/17/2023      Assessment & Plan:   Problem List Items Addressed This Visit       Respiratory   Emphysema of lung (HCC) - Primary    Emphysema Chronic cough and wheezing likely due to emphysema and tobacco use. No signs of acute infection. - Refill Ventolin  inhaler.  2 puffs every 6 hours as needed refilled Smoking cessation encouraged - Order chest CT scan but has not been completed, advised to follow-up on the order       Relevant Medications   nicotine  (NICODERM CQ  - DOSED IN MG/24 HOURS) 14 mg/24hr patch   methylPREDNISolone  (MEDROL  DOSEPAK) 4 MG TBPK tablet (Start on 11/23/2023)   VENTOLIN  HFA 108 (90 Base) MCG/ACT inhaler   OSA (obstructive sleep apnea)    Mild obstructive sleep apnea diagnosed via sleep study. - Order CPAP machine.       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 Persistent pain disrupts sleep. Previous treatments insufficient. - Switch to tizanidine  2 mg 3 times daily as needed due to age-related side effects of Flexeril . - Administer Solu-Medro 40 mg injection l and Toradol  30 mg injection in the office today  - Refer to orthopedics. - Recommend heating pad.diclofenac  gel and lidocaine  patch, get medication over-the-counter       Relevant Medications   QUEtiapine  (SEROQUEL ) 200 MG tablet   nicotine  (NICODERM CQ  - DOSED IN MG/24 HOURS) 14 mg/24hr patch   methylPREDNISolone  (MEDROL  DOSEPAK) 4 MG TBPK tablet (Start on 11/23/2023)   tiZANidine  (ZANAFLEX ) 2 MG tablet   Other Relevant Orders   Ambulatory referral to Orthopedic Surgery     Other   Insomnia   Insomnia related to chronic pain and possibly sleep apnea. Previously managed with quetiapine . - Refill quetiapine  200 mg at bedtime as needed CPAP ordered for sleep apnea        Relevant Medications   QUEtiapine  (SEROQUEL ) 200 MG tablet   Tobacco abuse   Smokes about 0.5 pack/day  Asked about quitting: confirms that he/ currently smokes cigarettes Advise to quit smoking: Educated about QUITTING to reduce the risk of cancer, cardio and cerebrovascular disease. Assess willingness: willing to quit at this time,  Assist with counseling and pharmacotherapy: Counseled for 5 minutes and literature provided. Arrange for follow up: follow  up in 6 weeks and continue to offer help.  - Order nicotine  patches (14 mg).      Relevant Medications   nicotine  (NICODERM CQ  - DOSED IN MG/24 HOURS) 14 mg/24hr patch   Dyslipidemia, goal LDL below 100   Prescribed atorvastatin  20 mg daily but not taking medication Medication refilled Follow-up in 6 weeks, will repeat labs at next visit Lab Results  Component Value Date   CHOL 208 (H) 06/17/2023   HDL 69 06/17/2023   LDLCALC 124 (H) 06/17/2023   TRIG 84 06/17/2023   CHOLHDL 3.0 06/17/2023         Relevant Medications   atorvastatin  (LIPITOR) 20 MG tablet   Seasonal allergies    Exacerbated by weather changes. Symptoms include nasal congestion and cough, possibly worsened by smoking. - Refill Flonase  nasal spray and loratadine  10 mg daily - Encourage smoking cessation.      Alcohol use    Reduced to two beers per day. Advised to further reduce intake to prevent liver damage.        Meds ordered this encounter  Medications  . famotidine  (PEPCID ) 20 MG tablet    Sig: Take 1 tablet (20 mg total) by mouth 2 (two) times daily.    Dispense:  60 tablet    Refill:  3  . DISCONTD: VENTOLIN  HFA 108 (90 Base) MCG/ACT inhaler    Sig: Inhale 2 puffs into the lungs every 6 (six) hours as needed for wheezing or shortness of breath.    Dispense:  18 g    Refill:  5  . QUEtiapine  (SEROQUEL ) 200 MG tablet    Sig: Take 1 tablet (200 mg total) by mouth at bedtime.    Dispense:  30 tablet    Refill:  10  . DISCONTD:  tizanidine  (ZANAFLEX ) 2 MG capsule    Sig: Take 1 capsule (2 mg total) by mouth 3 (three) times daily as needed for muscle spasms.    Dispense:  30 capsule    Refill:  1  . nicotine  (NICODERM CQ  - DOSED IN MG/24 HOURS) 14 mg/24hr patch    Sig: Place 1 patch (14 mg total) onto the skin daily.    Dispense:  42 patch    Refill:  0  . methylPREDNISolone  (MEDROL  DOSEPAK) 4 MG TBPK tablet    Sig: Take as instructed on the packaging    Dispense:  1 each    Refill:  0  . atorvastatin  (LIPITOR) 20 MG tablet    Sig: Take 1 tablet (20 mg total) by mouth daily.    Dispense:  90 tablet    Refill:  1  . VENTOLIN  HFA 108 (90 Base) MCG/ACT inhaler    Sig: Inhale 2 puffs into the lungs every 6 (six) hours as needed for wheezing or shortness of breath.    Dispense:  18 g    Refill:  5  . tiZANidine  (ZANAFLEX ) 2 MG tablet    Sig: Take 1 tablet (2 mg total) by mouth every 8 (eight) hours as needed for muscle spasms.    Dispense:  30 tablet    Refill:  0  . ketorolac  (TORADOL ) 30 MG/ML injection 30 mg  . methylPREDNISolone  sodium succinate (SOLU-MEDROL ) 40 MG injection 40 mg    Follow-up: Return in about 6 weeks (around 01/03/2024) for HYPERLIPIDEMIA , tobacco use disorder.    Bill Grey R Wreatha Sturgeon, FNP

## 2023-11-22 NOTE — Assessment & Plan Note (Addendum)
  Reduced to two beers per day. Advised to further reduce intake to prevent liver damage.

## 2023-11-22 NOTE — Assessment & Plan Note (Signed)
  Exacerbated by weather changes. Symptoms include nasal congestion and cough, possibly worsened by smoking. - Refill Flonase  nasal spray and loratadine  10 mg daily - Encourage smoking cessation.

## 2023-11-22 NOTE — Assessment & Plan Note (Signed)
 Insomnia related to chronic pain and possibly sleep apnea. Previously managed with quetiapine . - Refill quetiapine  200 mg at bedtime as needed CPAP ordered for sleep apnea

## 2023-11-22 NOTE — Assessment & Plan Note (Addendum)
 Prescribed atorvastatin  20 mg daily but not taking medication Medication refilled Follow-up in 6 weeks, will repeat labs at next visit Lab Results  Component Value Date   CHOL 208 (H) 06/17/2023   HDL 69 06/17/2023   LDLCALC 124 (H) 06/17/2023   TRIG 84 06/17/2023   CHOLHDL 3.0 06/17/2023

## 2023-11-22 NOTE — Assessment & Plan Note (Addendum)
  Mild obstructive sleep apnea diagnosed via sleep study. - Order CPAP machine.

## 2023-11-25 ENCOUNTER — Other Ambulatory Visit: Payer: Self-pay

## 2023-12-04 ENCOUNTER — Other Ambulatory Visit: Payer: Self-pay

## 2023-12-16 ENCOUNTER — Other Ambulatory Visit: Payer: Self-pay

## 2024-01-03 ENCOUNTER — Ambulatory Visit: Payer: Self-pay | Admitting: Nurse Practitioner

## 2024-01-06 ENCOUNTER — Encounter: Payer: Self-pay | Admitting: Radiology

## 2024-01-17 ENCOUNTER — Ambulatory Visit: Payer: Self-pay | Admitting: Nurse Practitioner

## 2024-01-17 ENCOUNTER — Encounter: Payer: Self-pay | Admitting: Nurse Practitioner

## 2024-01-17 VITALS — BP 124/83 | HR 77 | Wt 198.0 lb

## 2024-01-17 DIAGNOSIS — I700 Atherosclerosis of aorta: Secondary | ICD-10-CM

## 2024-01-17 DIAGNOSIS — Z23 Encounter for immunization: Secondary | ICD-10-CM | POA: Diagnosis not present

## 2024-01-17 DIAGNOSIS — G4700 Insomnia, unspecified: Secondary | ICD-10-CM | POA: Diagnosis not present

## 2024-01-17 DIAGNOSIS — E785 Hyperlipidemia, unspecified: Secondary | ICD-10-CM | POA: Diagnosis not present

## 2024-01-17 DIAGNOSIS — J439 Emphysema, unspecified: Secondary | ICD-10-CM

## 2024-01-17 DIAGNOSIS — F39 Unspecified mood [affective] disorder: Secondary | ICD-10-CM

## 2024-01-17 DIAGNOSIS — F1090 Alcohol use, unspecified, uncomplicated: Secondary | ICD-10-CM

## 2024-01-17 DIAGNOSIS — F17200 Nicotine dependence, unspecified, uncomplicated: Secondary | ICD-10-CM

## 2024-01-17 MED ORDER — QUETIAPINE FUMARATE 100 MG PO TABS
100.0000 mg | ORAL_TABLET | Freq: Every day | ORAL | 1 refills | Status: AC
Start: 2024-01-17 — End: ?

## 2024-01-17 MED ORDER — ATORVASTATIN CALCIUM 20 MG PO TABS: 20.0000 mg | ORAL_TABLET | Freq: Every day | ORAL | 1 refills | Status: DC

## 2024-01-17 NOTE — Assessment & Plan Note (Signed)
 Not taking Seroquel  consistently will decrease dose to 100 mg at bedtime Will Follow-up on CPAP ordered

## 2024-01-17 NOTE — Progress Notes (Signed)
 Established Patient Office Visit  Subjective:  Patient ID: Bill Millon., male    DOB: 19-Jul-1960  Age: 63 y.o. MRN: [id number]  CC:  Chief Complaint  Patient presents with   Hyperlipidemia    HPI    Discussed the use of AI scribe software for clinical note transcription with the patient, who gave verbal consent to proceed.  History of Present Illness Bill Velazquez. is a 63 year old male  has a past medical history of Alcohol abuse with intoxication, Anxiety, Arthritis, Blood transfusion without reported diagnosis (2023), Depression, GERD (gastroesophageal reflux disease), Insomnia, Mood disorder, Seasonal allergies, Substance abuse (HCC), TBI (traumatic brain injury) (HCC), and TBI (traumatic brain injury) (HCC).   who presents for follow-up for hyperlipidemia. He is accompanied by his sister, who assists with his care.  He has hyperlipidemia and is currently not taking his prescribed atorvastatin . He has not started the medication and requires assistance with organizing his pills. His sister, who helps manage his medications, states that he needs a case worker to assist with adherence.  He consumes three to four beers daily. He also smokes three to five cigarettes daily, despite having emphysema. He has reduced his smoking but continues to use cigarettes. He uses a Ventolin  inhaler as needed for his respiratory condition.  He has not received a CPAP machine for his sleep apnea, and his sister confirms that he has not been contacted regarding this. He also takes Seroquel  as needed, which his sister insists is necessary due to sleep disturbances.  Sister is also suggesting that the patient sees a psychiatrist  He takes vitamin B and vitamin D  supplements, following the instructions provided on a paper he brought to the pharmacy. He has received his flu shot recently and had a colonoscopy last year, although there is some confusion about the need for a repeat procedure.   Had  colonoscopy done in September 2024 benign hyperplastic polyps removed, polyps are not precancerous and have been no malignancy potation [facility] there was no specific follow-up recommended  Assessment & Plan     Past Medical History:  Diagnosis Date   Alcohol abuse with intoxication    Anxiety    on meds   Arthritis    generalized   Blood transfusion without reported diagnosis 2023   Depression    on meds   GERD (gastroesophageal reflux disease)    OTC PRN meds/with certain foods   Insomnia    Mood disorder    Seasonal allergies    Substance abuse (HCC)    TBI (traumatic brain injury) (HCC)    TBI (traumatic brain injury) (HCC)     Past Surgical History:  Procedure Laterality Date   IR ANGIOGRAM VISCERAL SELECTIVE  11/13/2021   IR ANGIOGRAM VISCERAL SELECTIVE  11/13/2021   IR ANGIOGRAM VISCERAL SELECTIVE  11/13/2021   IR EMBO ART  VEN HEMORR LYMPH EXTRAV  INC GUIDE ROADMAPPING  11/13/2021   IR GASTR TUBE CONVERT GASTR-JEJ PER W/FL MOD SED  12/08/2021   IR GASTR TUBE CONVERT GASTR-JEJ PER W/FL MOD SED  12/19/2021   IR RADIOLOGIST EVAL & MGMT  12/26/2021   IR US  GUIDE VASC ACCESS RIGHT  11/13/2021   ORIF CLAVICULAR FRACTURE Right 11/20/2021   Procedure: OPEN REDUCTION INTERNAL FIXATION (ORIF) CLAVICULAR FRACTURE;  Surgeon: Celena Sharper, MD;  Location: [facility] OR;  Service: Orthopedics;  Laterality: Right;   ORIF HUMERUS FRACTURE Right 11/20/2021   Procedure: OPEN REDUCTION INTERNAL FIXATION (ORIF) HUMERAL SHAFT FRACTURE;  Surgeon:  Celena Sharper, MD;  Location: [facility] OR;  Service: Orthopedics;  Laterality: Right;   ORIF SHOULDER FRACTURE Left 11/20/2021   Procedure: OPEN REDUCTION INTERNAL FIXATION (ORIF) FRACTURE OF THE LEFT CHOROCOID PROCESS;  Surgeon: Celena Sharper, MD;  Location: [facility] OR;  Service: Orthopedics;  Laterality: Left;   ORIF SHOULDER FRACTURE Left 11/20/2021   Procedure: OPEN REDUCTION INTERNAL FIXATION (ORIF) LEFT ACROMIUM;  Surgeon: Celena Sharper, MD;  Location: [facility] OR;   Service: Orthopedics;  Laterality: Left;   PEG PLACEMENT N/A 11/30/2021   Procedure: PERCUTANEOUS ENDOSCOPIC GASTROSTOMY (PEG) PLACEMENT;  Surgeon: Sebastian Moles, MD;  Location: [facility] OR;  Service: General;  Laterality: N/A;   RADIOLOGY WITH ANESTHESIA N/A 11/13/2021   Procedure: IR WITH ANESTHESIA;  Surgeon: Hughes Simmonds, MD;  Location: [facility] OR;  Service: Radiology;  Laterality: N/A;   TRACHEOSTOMY TUBE PLACEMENT N/A 11/30/2021   Procedure: TRACHEOSTOMY;  Surgeon: Sebastian Moles, MD;  Location: [facility] OR;  Service: General;  Laterality: N/A;    Family History  Problem Relation Age of Onset   Colon polyps Mother 41   Colon polyps Father 4   Diabetes Father    Colon polyps Sister 79   Diabetes Sister    Colon cancer Neg Hx    Esophageal cancer Neg Hx    Rectal cancer Neg Hx    Stomach cancer Neg Hx     Social History   Socioeconomic History   Marital status: Single    Spouse name: Not on file   Number of children: Not on file   Years of education: Not on file   Highest education level: Not on file  Occupational History   Not on file  Tobacco Use   Smoking status: Every Day    Current packs/day: 0.25    Types: Cigarettes   Smokeless tobacco: Never  Vaping Use   Vaping status: Never Used  Substance and Sexual Activity   Alcohol use: Yes    Alcohol/week: 4.0 standard drinks of alcohol    Types: 4 Cans of beer per week    Comment: 2 beer daily , one drink of liquor daily   Drug use: Yes    Types: Cocaine    Sexual activity: Yes  Other Topics Concern   Not on file  Social History Narrative   Lives with his sister.    Social Drivers of Corporate Investment Banker Strain: Not on file  Food Insecurity: No Food Insecurity (11/22/2023)   Hunger Vital Sign    Worried About Running Out of Food in the Last Year: Never true    Ran Out of Food in the Last Year: Never true  Transportation Needs: No Transportation Needs (11/22/2023)   PRAPARE - Administrator, Civil Service  (Medical): No    Lack of Transportation (Non-Medical): No  Physical Activity: Not on file  Stress: Not on file  Social Connections: Not on file  Intimate Partner Violence: Not At Risk (11/22/2023)   Humiliation, Afraid, Rape, and Kick questionnaire    Fear of Current or Ex-Partner: No    Emotionally Abused: No    Physically Abused: No    Sexually Abused: No    Outpatient Medications Prior to Visit  Medication Sig Dispense Refill   acetaminophen  (TYLENOL ) 325 MG tablet Take 1-2 tablets (325-650 mg total) by mouth every 6 (six) hours as needed for mild pain. 100 tablet 0   diclofenac  Sodium (VOLTAREN ) 1 % GEL Apply 4 g topically 4 (four) times daily. 400 g 0  fluticasone  (FLONASE ) 50 MCG/ACT nasal spray INSTILL 2 SPRAYS IN EACH NOSTRIL DAILY 16 g 10   loratadine  (CLARITIN ) 10 MG tablet Take 1 tablet (10 mg total) by mouth daily. 30 tablet 5   polyethylene glycol powder (GLYCOLAX /MIRALAX ) 17 GM/SCOOP powder Take 17 g by mouth daily. 238 g 0   thiamine  (VITAMIN B1) 100 MG tablet Take 1 tablet (100 mg total) by mouth daily. 90 tablet 0   tiZANidine  (ZANAFLEX ) 2 MG tablet Take 1 tablet (2 mg total) by mouth every 8 (eight) hours as needed for muscle spasms. 30 tablet 0   vitamin D3 (CHOLECALCIFEROL ) 25 MCG tablet Take 1 tablet (1,000 Units total) by mouth daily. 30 tablet 0   guaiFENesin  (MUCINEX ) 600 MG 12 hr tablet Take 1 tablet (600 mg total) by mouth 2 (two) times daily. 60 tablet 3   famotidine  (PEPCID ) 20 MG tablet Take 1 tablet (20 mg total) by mouth 2 (two) times daily. (Patient not taking: Reported on 01/17/2024) 60 tablet 3   folic acid  (FOLVITE ) 1 MG tablet Take 1 tablet (1 mg total) by mouth daily. (Patient not taking: Reported on 01/17/2024) 90 tablet 0   lidocaine  (LIDODERM ) 5 % Place 1 patch onto the skin daily. Remove & Discard patch within 12 hours or as directed by MD (Patient not taking: Reported on 01/17/2024) 30 patch 0   Skin Protectants, Misc. (MINERIN CREME) CREA Apply 1  Application topically daily. (Patient not taking: Reported on 01/17/2024) 113 g 10   VENTOLIN  HFA 108 (90 Base) MCG/ACT inhaler Inhale 2 puffs into the lungs every 6 (six) hours as needed for wheezing or shortness of breath. (Patient not taking: Reported on 01/17/2024) 18 g 5   atorvastatin  (LIPITOR) 20 MG tablet Take 1 tablet (20 mg total) by mouth daily. (Patient not taking: Reported on 01/17/2024) 90 tablet 1   methylPREDNISolone  (MEDROL  DOSEPAK) 4 MG TBPK tablet Take as instructed on the packaging (Patient not taking: Reported on 01/17/2024) 1 each 0   nicotine  (NICODERM CQ  - DOSED IN MG/24 HOURS) 14 mg/24hr patch Place 1 patch (14 mg total) onto the skin daily. (Patient not taking: Reported on 01/17/2024) 42 patch 0   propranolol  (INDERAL ) 10 MG tablet Take 1 tablet (10 mg total) by mouth 2 (two) times daily. (Patient not taking: Reported on 01/17/2024) 180 tablet 0   QUEtiapine  (SEROQUEL ) 200 MG tablet Take 1 tablet (200 mg total) by mouth at bedtime. (Patient not taking: Reported on 01/17/2024) 30 tablet 10   senna-docusate (SENOKOT-S) 8.6-50 MG tablet Take 2 tablets by mouth daily at 6 (six) AM. (Patient not taking: Reported on 01/17/2024) 180 tablet 0   tamsulosin  (FLOMAX ) 0.4 MG CAPS capsule Take 1 capsule (0.4 mg total) by mouth daily after supper. (Patient not taking: Reported on 01/17/2024) 90 capsule 0   No facility-administered medications prior to visit.    Allergies  Allergen Reactions   Seasonal Ic [Octacosanol]     ROS Review of Systems  Constitutional:  Negative for appetite change, chills, fatigue and fever.  HENT:  Negative for congestion, postnasal drip, rhinorrhea and sneezing.   Respiratory:  Negative for cough, shortness of breath and wheezing.   Cardiovascular:  Negative for chest pain, palpitations and leg swelling.  Gastrointestinal:  Negative for abdominal pain, constipation, nausea and vomiting.  Genitourinary:  Negative for difficulty urinating, dysuria, flank  pain and frequency.  Musculoskeletal:  Negative for arthralgias, back pain, joint swelling and myalgias.  Skin:  Negative for color change, pallor, rash and  wound.  Neurological:  Negative for dizziness, facial asymmetry, weakness, numbness and headaches.  Psychiatric/Behavioral:  Negative for behavioral problems, confusion, self-injury and suicidal ideas.       Objective:    Physical Exam Vitals and nursing note reviewed.  Constitutional:      General: He is not in acute distress.    Appearance: Normal appearance. He is not ill-appearing, toxic-appearing or diaphoretic.  Eyes:     General: No scleral icterus.       Right eye: No discharge.        Left eye: No discharge.     Extraocular Movements: Extraocular movements intact.     Conjunctiva/sclera: Conjunctivae normal.  Cardiovascular:     Rate and Rhythm: Normal rate and regular rhythm.     Pulses: Normal pulses.     Heart sounds: Normal heart sounds. No murmur heard.    No friction rub. No gallop.  Pulmonary:     Effort: Pulmonary effort is normal. No respiratory distress.     Breath sounds: Normal breath sounds. No stridor. No wheezing, rhonchi or rales.  Chest:     Chest wall: No tenderness.  Abdominal:     General: There is no distension.     Palpations: Abdomen is soft.     Tenderness: There is no abdominal tenderness. There is no right CVA tenderness, left CVA tenderness or guarding.  Musculoskeletal:        General: No swelling, tenderness, deformity or signs of injury.     Right lower leg: No edema.     Left lower leg: No edema.  Skin:    General: Skin is warm and dry.     Capillary Refill: Capillary refill takes less than 2 seconds.     Coloration: Skin is not jaundiced or pale.     Findings: No bruising, erythema or lesion.  Neurological:     Mental Status: He is alert and oriented to person, place, and time.     Motor: No weakness.     Gait: Gait normal.  Psychiatric:        Mood and Affect: Mood normal.         Behavior: Behavior normal.        Thought Content: Thought content normal.        Judgment: Judgment normal.     BP 124/83   Pulse 77   Wt 198 lb (89.8 kg)   SpO2 100%   BMI 25.42 kg/m  Wt Readings from Last 3 Encounters:  01/17/24 198 lb (89.8 kg)  11/22/23 187 lb (84.8 kg)  06/17/23 195 lb (88.5 kg)    Lab Results  Component Value Date   TSH 2.190 02/28/2022   Lab Results  Component Value Date   WBC 6.7 06/17/2023   HGB 14.0 06/17/2023   HCT 44.5 06/17/2023   MCV 87 06/17/2023   PLT 285 06/17/2023   Lab Results  Component Value Date   NA 137 06/17/2023   K 4.9 06/17/2023   CO2 21 06/17/2023   GLUCOSE 87 06/17/2023   BUN 19 06/17/2023   CREATININE 1.38 (H) 06/17/2023   BILITOT 0.2 06/17/2023   ALKPHOS 92 06/17/2023   AST 32 06/17/2023   ALT 22 06/17/2023   PROT 7.9 06/17/2023   ALBUMIN  4.5 06/17/2023   CALCIUM  9.6 06/17/2023   ANIONGAP 8 03/26/2022   EGFR 57 (L) 06/17/2023   Lab Results  Component Value Date   CHOL 208 (H) 06/17/2023   Lab Results  Component  Value Date   HDL 69 06/17/2023   Lab Results  Component Value Date   LDLCALC 124 (H) 06/17/2023   Lab Results  Component Value Date   TRIG 84 06/17/2023   Lab Results  Component Value Date   CHOLHDL 3.0 06/17/2023   Lab Results  Component Value Date   HGBA1C 5.6 06/17/2023      Assessment & Plan:   Problem List Items Addressed This Visit       Cardiovascular and Mediastinum   Aortic atherosclerosis   Nonadherence to atorvastatin  Patient encouraged to take medication daily as ordered Smoking cessation encouraged The 10-year ASCVD risk score (Arnett DK, et al., 2019) is: 14%       Relevant Medications   atorvastatin  (LIPITOR) 20 MG tablet   Other Relevant Orders   AMB Referral VBCI Care Management     Respiratory   Emphysema of lung (HCC)   No recent lung cancer screening.- Ordered chest CT for lung cancer screening but not completed - Encouraged smoking  cessation.          Other   Insomnia   Not taking Seroquel  consistently will decrease dose to 100 mg at bedtime Will Follow-up on CPAP ordered      Relevant Medications   QUEtiapine  (SEROQUEL ) 100 MG tablet   Mood disorder - Primary   He currently denies depression Patient states that he would like the patient to be evaluated by a psychiatrist, referral placed       Relevant Medications   QUEtiapine  (SEROQUEL ) 100 MG tablet   Other Relevant Orders   Ambulatory referral to Psychiatry   Tobacco use disorder   Smokes about less than one pack/day  Asked about quitting: confirms that he currently smokes cigarettes Advise to quit smoking: Educated about QUITTING to reduce the risk of cancer, cardio and cerebrovascular disease. Assess willingness: Unwilling to quit at this time, but is working on cutting back. Assist with counseling and pharmacotherapy: Counseled for 5 minutes and literature provided. Arrange for follow up: follow up in 2months and continue to offer help.       Need for influenza vaccination   Relevant Orders   Flu vaccine trivalent PF, 6mos and older(Flulaval,Afluria,Fluarix,Fluzone) (Completed)   Dyslipidemia, goal LDL below 100   Lab Results  Component Value Date   CHOL 208 (H) 06/17/2023   HDL 69 06/17/2023   LDLCALC 124 (H) 06/17/2023   TRIG 84 06/17/2023   CHOLHDL 3.0 06/17/2023   Non-adherence to atorvastatin  likely causing elevated cholesterol. - Encouraged adherence to atorvastatin  20 mg daily. - Ordered direct LDL - Referred to pharmacy for adherence support.      Relevant Medications   atorvastatin  (LIPITOR) 20 MG tablet   Other Relevant Orders   Direct LDL   AMB Referral VBCI Care Management   Alcohol use   - Advised limiting to two drinks per day. - Encouraged complete abstinence.       Meds ordered this encounter  Medications   QUEtiapine  (SEROQUEL ) 100 MG tablet    Sig: Take 1 tablet (100 mg total) by mouth at bedtime.     Dispense:  90 tablet    Refill:  1   atorvastatin  (LIPITOR) 20 MG tablet    Sig: Take 1 tablet (20 mg total) by mouth daily.    Dispense:  90 tablet    Refill:  1    Follow-up: Return in about 2 months (around 03/18/2024) for HYPERLIPIDEMIA.    Kael Keetch R Jhonnie Aliano, FNP

## 2024-01-17 NOTE — Patient Instructions (Signed)
 1. Hyperlipidemia, unspecified hyperlipidemia type (Primary)  - Direct LDL - atorvastatin  (LIPITOR) 20 MG tablet; Take 1 tablet (20 mg total) by mouth daily.  Dispense: 90 tablet; Refill: 1  . Insomnia, unspecified type  - QUEtiapine  (SEROQUEL ) 100 MG tablet; Take 1 tablet (100 mg total) by mouth at bedtime.  Dispense: 90 tablet; Refill: 1   Moderate episode of recurrent major depressive disorder (HCC)  - Ambulatory referral to Psychiatry - QUEtiapine  (SEROQUEL ) 100 MG tablet; Take 1 tablet (100 mg total) by mouth at bedtime.  Dispense: 90 tablet; Refill: 1    It is important that you exercise regularly at least 30 minutes 5 times a week as tolerated  Think about what you will eat, plan ahead. Choose  clean, green, fresh or frozen over canned, processed or packaged foods which are more sugary, salty and fatty. 70 to 75% of food eaten should be vegetables and fruit. Three meals at set times with snacks allowed between meals, but they must be fruit or vegetables. Aim to eat over a 12 hour period , example 7 am to 7 pm, and STOP after  your last meal of the day. Drink water ,generally about 64 ounces per day, no other drink is as healthy. Fruit juice is best enjoyed in a healthy way, by EATING the fruit.  Thanks for choosing [facility] we consider it a privelige to serve you.

## 2024-01-17 NOTE — Assessment & Plan Note (Signed)
 Nonadherence to atorvastatin  Patient encouraged to take medication daily as ordered Smoking cessation encouraged The 10-year ASCVD risk score (Arnett DK, et al., 2019) is: 14%

## 2024-01-17 NOTE — Assessment & Plan Note (Signed)
 Lab Results  Component Value Date   CHOL 208 (H) 06/17/2023   HDL 69 06/17/2023   LDLCALC 124 (H) 06/17/2023   TRIG 84 06/17/2023   CHOLHDL 3.0 06/17/2023   Non-adherence to atorvastatin  likely causing elevated cholesterol. - Encouraged adherence to atorvastatin  20 mg daily. - Ordered direct LDL - Referred to pharmacy for adherence support.

## 2024-01-17 NOTE — Assessment & Plan Note (Signed)
 He currently denies depression Patient states that he would like the patient to be evaluated by a psychiatrist, referral placed

## 2024-01-17 NOTE — Assessment & Plan Note (Signed)
-   Advised limiting to two drinks per day. - Encouraged complete abstinence.

## 2024-01-17 NOTE — Assessment & Plan Note (Deleted)
    01/17/2024    3:10 PM 11/22/2023    2:08 PM 06/17/2023   10:16 AM  Depression screen PHQ 2/9  Decreased Interest 0 0 0  Down, Depressed, Hopeless 0 0 0  PHQ - 2 Score 0 0 0  Altered sleeping 0 3 3  Tired, decreased energy 0 0 0  Change in appetite 0 0 0  Feeling bad or failure about yourself  0 0 0  Trouble concentrating 0 0 0  Moving slowly or fidgety/restless 0 0 0  Suicidal thoughts 0 0 0  PHQ-9 Score 0 3  3   Difficult doing work/chores Not difficult at all Not difficult at all Not difficult at all     Data saved with a previous flowsheet row definition  Denied depression but the patient's sister would like patient to be evaluated by a psychiatrist, referral placed

## 2024-01-17 NOTE — Assessment & Plan Note (Signed)
 No recent lung cancer screening.- Ordered chest CT for lung cancer screening but not completed - Encouraged smoking cessation.

## 2024-01-17 NOTE — Assessment & Plan Note (Signed)
 Smokes about less than one pack/day  Asked about quitting: confirms that he currently smokes cigarettes Advise to quit smoking: Educated about QUITTING to reduce the risk of cancer, cardio and cerebrovascular disease. Assess willingness: Unwilling to quit at this time, but is working on cutting back. Assist with counseling and pharmacotherapy: Counseled for 5 minutes and literature provided. Arrange for follow up: follow up in 2months and continue to offer help.

## 2024-01-18 LAB — LDL CHOLESTEROL, DIRECT: LDL Direct: 151 mg/dL — ABNORMAL HIGH (ref 0–99)

## 2024-01-20 ENCOUNTER — Ambulatory Visit: Payer: Self-pay | Admitting: Nurse Practitioner

## 2024-01-21 ENCOUNTER — Telehealth: Payer: Self-pay

## 2024-01-21 NOTE — Telephone Encounter (Signed)
 LVM for pt sister after confirming DPR. Pt will need to call [facility] to schedule at [phone] opt 1 and a message was sent to [facility] for cpap. KH

## 2024-01-21 NOTE — Telephone Encounter (Signed)
-----   Message from Folashade R Paseda sent at 01/17/2024  3:49 PM EST ----- Please follow up on low dose lung CT scan and CPAP machine. Thanks

## 2024-01-22 ENCOUNTER — Telehealth: Payer: Self-pay

## 2024-01-22 NOTE — Progress Notes (Signed)
 Care Guide Pharmacy Note  01/22/2024 Name: Bill Brooks. MRN: [id number] DOB: 1961-02-24  Referred By: Paseda, Folashade R, FNP Reason for referral: Complex Care Management and Call Attempt #1 (Successful initial outreach to schedule with PHARM D- Layna )   Bill Janine Raddle. is a 63 y.o. year old male who is a primary care patient of Paseda, Folashade R, FNP.  Bill Janine Raddle. was referred to the pharmacist for assistance related to: HTN  A second unsuccessful telephone outreach was attempted today to contact the patient who was referred to the pharmacy team for assistance with Disease management. Additional attempts will be made to contact the patient.  Bill Brooks [facility] Guide  Direct Dial: [phone]  Fax [phone]

## 2024-01-24 NOTE — Progress Notes (Signed)
 Care Guide Pharmacy Note  01/24/2024 Name: [name]. MRN: [id number] DOB: [birth year]  Referred By: Paseda, Folashade R, FNP Reason for referral: Complex Care Management, Call Attempt #1 (Successful initial outreach to schedule with PHARM DGLENWOOD Bun ), Call Attempt #2 (Successful initial outreach to schedule with PHARM DGLENWOOD Bun /), and Call Attempt #3 (Successful initial outreach to schedule with PHARM D- Layna /)   Bill Brooks. is a 63 y.o. year old male who is a primary care patient of Paseda, Folashade R, FNP.  Bill Brooks. was referred to the pharmacist for assistance related to: HTN  A third unsuccessful telephone outreach was attempted today to contact the patient who was referred to the pharmacy team for assistance with Disease management. The Population Health team is pleased to engage with this patient at any time in the future upon receipt of referral and should he/she be interested in assistance from the [facility] team.  Leotis Rase [facility]  [facility], [facility] Guide  Direct Dial: [phone]  Fax [phone]

## 2024-02-04 ENCOUNTER — Telehealth: Payer: Self-pay | Admitting: Nurse Practitioner

## 2024-02-04 NOTE — Telephone Encounter (Signed)
 Copied from CRM #[id number]. Topic: General - Other >> Feb 03, 2024  3:56 PM Antwanette L wrote: Reason for CRM: Roselie from [facility] called to ask if authorization is required for a CT chest lung cancer screening scheduled for 12/5. She can be reached at [phone] and requested that any documentation be faxed at [phone]

## 2024-02-06 ENCOUNTER — Encounter: Payer: Self-pay | Admitting: Nurse Practitioner

## 2024-02-06 NOTE — Telephone Encounter (Signed)
 Done and [facility] will call and make him a appt. Kh

## 2024-02-07 ENCOUNTER — Other Ambulatory Visit

## 2024-02-12 ENCOUNTER — Encounter: Payer: Self-pay | Admitting: Nurse Practitioner

## 2024-02-17 ENCOUNTER — Inpatient Hospital Stay: Admission: RE | Admit: 2024-02-17 | Discharge: 2024-02-17 | Attending: Nurse Practitioner

## 2024-02-17 DIAGNOSIS — Z122 Encounter for screening for malignant neoplasm of respiratory organs: Secondary | ICD-10-CM

## 2024-02-21 ENCOUNTER — Ambulatory Visit: Payer: Self-pay | Admitting: Nurse Practitioner

## 2024-02-21 DIAGNOSIS — I7121 Aneurysm of the ascending aorta, without rupture: Secondary | ICD-10-CM | POA: Insufficient documentation

## 2024-03-02 ENCOUNTER — Other Ambulatory Visit: Payer: Self-pay

## 2024-03-02 ENCOUNTER — Ambulatory Visit: Admitting: Physical Medicine and Rehabilitation

## 2024-03-02 ENCOUNTER — Encounter: Payer: Self-pay | Admitting: Physical Medicine and Rehabilitation

## 2024-03-02 DIAGNOSIS — G8929 Other chronic pain: Secondary | ICD-10-CM

## 2024-03-02 DIAGNOSIS — M5441 Lumbago with sciatica, right side: Secondary | ICD-10-CM | POA: Diagnosis not present

## 2024-03-02 DIAGNOSIS — M5416 Radiculopathy, lumbar region: Secondary | ICD-10-CM

## 2024-03-02 NOTE — Progress Notes (Unsigned)
 Pain Scale   Average Pain 4 Patient advising he has chronic lower back pain that increases when lying down or bending over.        +Driver, -BT, -Dye Allergies.
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
## 2024-03-20 ENCOUNTER — Encounter: Payer: Self-pay | Admitting: Nurse Practitioner

## 2024-03-20 ENCOUNTER — Ambulatory Visit: Payer: Self-pay | Admitting: Nurse Practitioner

## 2024-03-20 VITALS — BP 125/86 | HR 60 | Temp 97.9°F | Wt 195.0 lb

## 2024-03-20 DIAGNOSIS — I7121 Aneurysm of the ascending aorta, without rupture: Secondary | ICD-10-CM | POA: Diagnosis not present

## 2024-03-20 DIAGNOSIS — E785 Hyperlipidemia, unspecified: Secondary | ICD-10-CM | POA: Diagnosis not present

## 2024-03-20 DIAGNOSIS — M1990 Unspecified osteoarthritis, unspecified site: Secondary | ICD-10-CM | POA: Diagnosis not present

## 2024-03-20 DIAGNOSIS — F17200 Nicotine dependence, unspecified, uncomplicated: Secondary | ICD-10-CM

## 2024-03-20 DIAGNOSIS — G4733 Obstructive sleep apnea (adult) (pediatric): Secondary | ICD-10-CM | POA: Diagnosis not present

## 2024-03-20 MED ORDER — MINERIN CREME EX CREA: 1.0000 | TOPICAL_CREAM | Freq: Every day | CUTANEOUS | 10 refills | Status: AC

## 2024-03-20 MED ORDER — DICLOFENAC SODIUM 1 % EX GEL: 4.0000 g | Freq: Four times a day (QID) | CUTANEOUS | 0 refills | Status: AC

## 2024-03-20 NOTE — Assessment & Plan Note (Signed)
" °  Managed with atorvastatin  20 mg daily. Emphasized medication adherence and dietary modifications for cholesterol control. - Continue atorvastatin  20 mg daily. - Ordered fasting lipid panel next week. - Advised to avoid fatty and fried foods.  "

## 2024-03-20 NOTE — Assessment & Plan Note (Addendum)
 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

## 2024-03-20 NOTE — Patient Instructions (Signed)

## 2024-03-20 NOTE — Assessment & Plan Note (Signed)
" °  Voltaren  gel prescription provided. - Refilled Voltaren  gel prescription.  "

## 2024-03-20 NOTE — Progress Notes (Signed)
 "  Established Patient Office Visit  Subjective:  Patient ID: Bill Brooks., male    DOB: 06/28/1960  Age: 64 y.o. MRN: [id number]  CC:  Chief Complaint  Patient presents with   Follow-up    2 month.    HPI   Discussed the use of AI scribe software for clinical note transcription with the patient, who gave verbal consent to proceed.  History of Present Illness Bill Brooks. is a 64 year old male  has a past medical history of Alcohol abuse with intoxication, Anxiety, Arthritis, Blood transfusion without reported diagnosis (2023), Depression, GERD (gastroesophageal reflux disease), Insomnia, Mood disorder, Seasonal allergies, Substance abuse (HCC), TBI (traumatic brain injury) (HCC), and TBI (traumatic brain injury) (HCC). who presents for a follow-up visit.  He has reduced his cigarette consumption by two cigarettes, which has led to an improvement in his coughing. He is working on quitting smoking entirely.  He takes atorvastatin  20 mg, although not consistently every day, as he sometimes forgets. He recently received a pill box from his sister to help with medication management. He misplaced his vitamins but has resumed taking them.  He is currently using a CPAP machine regularly and reports it is working well for him. He has been sleeping well and has not needed Seroquel  for sleep.  No chest pain, shortness of breath, or vomiting. He stays active by working around the house, currently stripping his floor, which he considers part of his exercise routine.   Assessment & Plan .         Past Medical History:  Diagnosis Date   Alcohol abuse with intoxication    Anxiety    on meds   Arthritis    generalized   Blood transfusion without reported diagnosis 2023   Depression    on meds   GERD (gastroesophageal reflux disease)    OTC PRN meds/with certain foods   Insomnia    Mood disorder    Seasonal allergies    Substance abuse (HCC)    TBI (traumatic brain injury)  (HCC)    TBI (traumatic brain injury) (HCC)     Past Surgical History:  Procedure Laterality Date   IR ANGIOGRAM VISCERAL SELECTIVE  11/13/2021   IR ANGIOGRAM VISCERAL SELECTIVE  11/13/2021   IR ANGIOGRAM VISCERAL SELECTIVE  11/13/2021   IR EMBO ART  VEN HEMORR LYMPH EXTRAV  INC GUIDE ROADMAPPING  11/13/2021   IR GASTR TUBE CONVERT GASTR-JEJ PER W/FL MOD SED  12/08/2021   IR GASTR TUBE CONVERT GASTR-JEJ PER W/FL MOD SED  12/19/2021   IR RADIOLOGIST EVAL & MGMT  12/26/2021   IR US  GUIDE VASC ACCESS RIGHT  11/13/2021   ORIF CLAVICULAR FRACTURE Right 11/20/2021   Procedure: OPEN REDUCTION INTERNAL FIXATION (ORIF) CLAVICULAR FRACTURE;  Surgeon: Celena Sharper, MD;  Location: [facility] OR;  Service: Orthopedics;  Laterality: Right;   ORIF HUMERUS FRACTURE Right 11/20/2021   Procedure: OPEN REDUCTION INTERNAL FIXATION (ORIF) HUMERAL SHAFT FRACTURE;  Surgeon: Celena Sharper, MD;  Location: [facility] OR;  Service: Orthopedics;  Laterality: Right;   ORIF SHOULDER FRACTURE Left 11/20/2021   Procedure: OPEN REDUCTION INTERNAL FIXATION (ORIF) FRACTURE OF THE LEFT CHOROCOID PROCESS;  Surgeon: Celena Sharper, MD;  Location: [facility] OR;  Service: Orthopedics;  Laterality: Left;   ORIF SHOULDER FRACTURE Left 11/20/2021   Procedure: OPEN REDUCTION INTERNAL FIXATION (ORIF) LEFT ACROMIUM;  Surgeon: Celena Sharper, MD;  Location: [facility] OR;  Service: Orthopedics;  Laterality: Left;   PEG PLACEMENT N/A 11/30/2021   Procedure:  PERCUTANEOUS ENDOSCOPIC GASTROSTOMY (PEG) PLACEMENT;  Surgeon: Sebastian Moles, MD;  Location: [facility] OR;  Service: General;  Laterality: N/A;   RADIOLOGY WITH ANESTHESIA N/A 11/13/2021   Procedure: IR WITH ANESTHESIA;  Surgeon: Hughes Simmonds, MD;  Location: [facility] OR;  Service: Radiology;  Laterality: N/A;   TRACHEOSTOMY TUBE PLACEMENT N/A 11/30/2021   Procedure: TRACHEOSTOMY;  Surgeon: Sebastian Moles, MD;  Location: [facility] OR;  Service: General;  Laterality: N/A;    Family History  Problem Relation Age of Onset   Colon polyps  Mother 36   Colon polyps Father 33   Diabetes Father    Colon polyps Sister 22   Diabetes Sister    Colon cancer Neg Hx    Esophageal cancer Neg Hx    Rectal cancer Neg Hx    Stomach cancer Neg Hx     Social History   Socioeconomic History   Marital status: Single    Spouse name: Not on file   Number of children: Not on file   Years of education: Not on file   Highest education level: Not on file  Occupational History   Not on file  Tobacco Use   Smoking status: Every Day    Current packs/day: 0.25    Types: Cigarettes   Smokeless tobacco: Never  Vaping Use   Vaping status: Never Used  Substance and Sexual Activity   Alcohol use: Yes    Alcohol/week: 4.0 standard drinks of alcohol    Types: 4 Cans of beer per week    Comment: 2 beer daily , one drink of liquor daily   Drug use: Yes    Types: Cocaine    Sexual activity: Yes  Other Topics Concern   Not on file  Social History Narrative   Lives with his sister.    Social Drivers of Health   Tobacco Use: High Risk (03/20/2024)   Patient History    Smoking Tobacco Use: Every Day    Smokeless Tobacco Use: Never    Passive Exposure: Not on file  Financial Resource Strain: Not on file  Food Insecurity: No Food Insecurity (03/20/2024)   Epic    Worried About Programme Researcher, Broadcasting/film/video in the Last Year: Never true    Ran Out of Food in the Last Year: Never true  Transportation Needs: No Transportation Needs (03/20/2024)   Epic    Lack of Transportation (Medical): No    Lack of Transportation (Non-Medical): No  Physical Activity: Not on file  Stress: Not on file  Social Connections: Not on file  Intimate Partner Violence: Not At Risk (03/20/2024)   Epic    Fear of Current or Ex-Partner: No    Emotionally Abused: No    Physically Abused: No    Sexually Abused: No  Depression (PHQ2-9): Low Risk (03/20/2024)   Depression (PHQ2-9)    PHQ-2 Score: 0  Alcohol Screen: Not on file  Housing: Low Risk (03/20/2024)   Epic     Unable to Pay for Housing in the Last Year: No    Number of Times Moved in the Last Year: 0    Homeless in the Last Year: No  Utilities: Not At Risk (03/20/2024)   Epic    Threatened with loss of utilities: No  Health Literacy: Not on file    Outpatient Medications Prior to Visit  Medication Sig Dispense Refill   acetaminophen  (TYLENOL ) 325 MG tablet Take 1-2 tablets (325-650 mg total) by mouth every 6 (six) hours as needed for mild pain. 100  tablet 0   atorvastatin  (LIPITOR) 20 MG tablet Take 1 tablet (20 mg total) by mouth daily. 90 tablet 1   famotidine  (PEPCID ) 20 MG tablet Take 1 tablet (20 mg total) by mouth 2 (two) times daily. 60 tablet 3   folic acid  (FOLVITE ) 1 MG tablet Take 1 tablet (1 mg total) by mouth daily. 90 tablet 0   lidocaine  (LIDODERM ) 5 % Place 1 patch onto the skin daily. Remove & Discard patch within 12 hours or as directed by MD 30 patch 0   loratadine  (CLARITIN ) 10 MG tablet Take 1 tablet (10 mg total) by mouth daily. 30 tablet 5   polyethylene glycol powder (GLYCOLAX /MIRALAX ) 17 GM/SCOOP powder Take 17 g by mouth daily. 238 g 0   QUEtiapine  (SEROQUEL ) 100 MG tablet Take 1 tablet (100 mg total) by mouth at bedtime. 90 tablet 1   thiamine  (VITAMIN B1) 100 MG tablet Take 1 tablet (100 mg total) by mouth daily. 90 tablet 0   tiZANidine  (ZANAFLEX ) 2 MG tablet Take 1 tablet (2 mg total) by mouth every 8 (eight) hours as needed for muscle spasms. 30 tablet 0   VENTOLIN  HFA 108 (90 Base) MCG/ACT inhaler Inhale 2 puffs into the lungs every 6 (six) hours as needed for wheezing or shortness of breath. 18 g 5   vitamin D3 (CHOLECALCIFEROL ) 25 MCG tablet Take 1 tablet (1,000 Units total) by mouth daily. 30 tablet 0   fluticasone  (FLONASE ) 50 MCG/ACT nasal spray INSTILL 2 SPRAYS IN EACH NOSTRIL DAILY (Patient not taking: Reported on 03/20/2024) 16 g 10   diclofenac  Sodium (VOLTAREN ) 1 % GEL Apply 4 g topically 4 (four) times daily. (Patient not taking: Reported on 03/20/2024) 400 g  0   Skin Protectants, Misc. (MINERIN CREME) CREA Apply 1 Application topically daily. (Patient not taking: Reported on 01/17/2024) 113 g 10   No facility-administered medications prior to visit.    Allergies[1]  ROS Review of Systems  Constitutional:  Negative for appetite change, chills, fatigue and fever.  HENT:  Negative for congestion, postnasal drip, rhinorrhea and sneezing.   Respiratory:  Negative for cough, shortness of breath and wheezing.   Cardiovascular:  Negative for chest pain, palpitations and leg swelling.  Gastrointestinal:  Negative for abdominal pain, constipation, nausea and vomiting.  Genitourinary:  Negative for difficulty urinating, dysuria, flank pain and frequency.  Musculoskeletal:  Negative for arthralgias, back pain, joint swelling and myalgias.  Skin:  Negative for color change, pallor, rash and wound.  Neurological:  Negative for dizziness, facial asymmetry, weakness, numbness and headaches.  Psychiatric/Behavioral:  Negative for behavioral problems, confusion, self-injury and suicidal ideas.       Objective:    Physical Exam Vitals and nursing note reviewed.  Constitutional:      General: He is not in acute distress.    Appearance: Normal appearance. He is not ill-appearing, toxic-appearing or diaphoretic.  Eyes:     General: No scleral icterus.       Right eye: No discharge.        Left eye: No discharge.     Extraocular Movements: Extraocular movements intact.     Conjunctiva/sclera: Conjunctivae normal.  Cardiovascular:     Rate and Rhythm: Normal rate and regular rhythm.     Pulses: Normal pulses.     Heart sounds: Normal heart sounds. No murmur heard.    No friction rub. No gallop.  Pulmonary:     Effort: Pulmonary effort is normal. No respiratory distress.  Breath sounds: Normal breath sounds. No stridor. No wheezing, rhonchi or rales.  Chest:     Chest wall: No tenderness.  Abdominal:     General: There is no distension.      Palpations: Abdomen is soft.     Tenderness: There is no abdominal tenderness. There is no right CVA tenderness, left CVA tenderness or guarding.  Musculoskeletal:        General: No swelling, tenderness, deformity or signs of injury.     Right lower leg: No edema.     Left lower leg: No edema.  Skin:    General: Skin is warm and dry.     Capillary Refill: Capillary refill takes less than 2 seconds.     Coloration: Skin is not jaundiced or pale.     Findings: No bruising, erythema or lesion.  Neurological:     Mental Status: He is alert and oriented to person, place, and time.     Motor: No weakness.     Coordination: Coordination normal.     Gait: Gait normal.  Psychiatric:        Mood and Affect: Mood normal.        Behavior: Behavior normal.        Thought Content: Thought content normal.        Judgment: Judgment normal.     BP 125/86   Pulse 60   Temp 97.9 F (36.6 C) (Temporal)   Wt 195 lb (88.5 kg)   SpO2 100%   BMI 25.04 kg/m  Wt Readings from Last 3 Encounters:  03/20/24 195 lb (88.5 kg)  01/17/24 198 lb (89.8 kg)  11/22/23 187 lb (84.8 kg)    Lab Results  Component Value Date   TSH 2.190 02/28/2022   Lab Results  Component Value Date   WBC 6.7 06/17/2023   HGB 14.0 06/17/2023   HCT 44.5 06/17/2023   MCV 87 06/17/2023   PLT 285 06/17/2023   Lab Results  Component Value Date   NA 137 06/17/2023   K 4.9 06/17/2023   CO2 21 06/17/2023   GLUCOSE 87 06/17/2023   BUN 19 06/17/2023   CREATININE 1.38 (H) 06/17/2023   BILITOT 0.2 06/17/2023   ALKPHOS 92 06/17/2023   AST 32 06/17/2023   ALT 22 06/17/2023   PROT 7.9 06/17/2023   ALBUMIN  4.5 06/17/2023   CALCIUM  9.6 06/17/2023   ANIONGAP 8 03/26/2022   EGFR 57 (L) 06/17/2023   Lab Results  Component Value Date   CHOL 208 (H) 06/17/2023   Lab Results  Component Value Date   HDL 69 06/17/2023   Lab Results  Component Value Date   LDLCALC 124 (H) 06/17/2023   Lab Results  Component Value Date    TRIG 84 06/17/2023   Lab Results  Component Value Date   CHOLHDL 3.0 06/17/2023   Lab Results  Component Value Date   HGBA1C 5.6 06/17/2023      Assessment & Plan:   Problem List Items Addressed This Visit       Cardiovascular and Mediastinum   Aneurysm of ascending aorta without rupture   Atherosclerotic thoracic aorta with dilated 4.1 cm ascending thoracic aorta Has cut down to 2 cigarettes daily  Has upcoming appointment with vascular        Respiratory   OSA (obstructive sleep apnea)   Managed with CPAP therapy.nightly - Continue CPAP therapy.         Musculoskeletal and Integument   Arthritis  Voltaren  gel prescription provided. - Refilled Voltaren  gel prescription.       Relevant Medications   diclofenac  Sodium (VOLTAREN ) 1 % GEL     Other   Tobacco use disorder    Reduced smoking by two cigarettes. Reports improved respiratory symptoms. - Encouraged complete smoking cessation.        Dyslipidemia, goal LDL below 100 - Primary    Managed with atorvastatin  20 mg daily. Emphasized medication adherence and dietary modifications for cholesterol control. - Continue atorvastatin  20 mg daily. - Ordered fasting lipid panel next week. - Advised to avoid fatty and fried foods.       Relevant Orders   Lipid panel    Meds ordered this encounter  Medications   diclofenac  Sodium (VOLTAREN ) 1 % GEL    Sig: Apply 4 g topically 4 (four) times daily.    Dispense:  400 g    Refill:  0   Skin Protectants, Misc. (MINERIN CREME) CREA    Sig: Apply 1 Application topically daily.    Dispense:  113 g    Refill:  10    Follow-up: Return in about 4 months (around 07/18/2024) for CPE.    Noriko Macari R Shaunette Gassner, FNP     [1]  Allergies Allergen Reactions   Seasonal Ic [Octacosanol]    "

## 2024-03-20 NOTE — Assessment & Plan Note (Addendum)
" °  Reduced smoking by two cigarettes. Reports improved respiratory symptoms. - Encouraged complete smoking cessation.   "

## 2024-03-20 NOTE — Assessment & Plan Note (Signed)
 Managed with CPAP therapy.nightly - Continue CPAP therapy.

## 2024-03-23 ENCOUNTER — Ambulatory Visit

## 2024-03-23 VITALS — BP 116/70 | HR 73 | Resp 20 | Ht 73.0 in | Wt 197.0 lb

## 2024-03-23 DIAGNOSIS — I7121 Aneurysm of the ascending aorta, without rupture: Secondary | ICD-10-CM | POA: Diagnosis not present

## 2024-03-23 NOTE — Progress Notes (Unsigned)
 "      [address] [place] [address]             [phone]            Bill Brooks [id number] [birth year]   History of Present Illness:  Bill Brooks. Is a 64 year old man with medical history of aortic atherosclerosis, emphysema, OSA, GERD, TBI, chronic back pain, osteoarthritis, hyperlipidemia, prediabetes, alcohol use, and current tobacco user who presents for initial encounter of ascending thoracic aortic aneurysm. This was found incidentally on a low dose CT scan for lung cancer screening.  Ascending aorta measured 4.1 cm.  He presents to the clinic today and reports that he is doing well.  His blood pressure is well controlled without the use of medications. 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 He is a current smoker of cigarettes and smokes around 5 cigarettes a day.  He is currently attempting to quit by slowly decreasing the number smoked per day.  He is down from 1 pack per day to 5 cigarettes. He denies chest pain, shortness of breath and lower leg edema.    Medications Ordered Prior to Encounter[1]   ROS: Review of Systems  Constitutional:  Negative for malaise/fatigue and weight loss.  Respiratory: Negative.  Negative for cough, hemoptysis, shortness of breath and wheezing.   Cardiovascular: Negative.  Negative for chest pain, palpitations and leg swelling.     BP 116/70   Pulse 73   Resp 20   Ht 6' 1 (1.854 m)   Wt 197 lb (89.4 kg)   SpO2 96% Comment: RA  BMI 25.99 kg/m   Physical Exam Constitutional:      Appearance: Normal appearance.  HENT:     Head: Normocephalic and atraumatic.  Cardiovascular:     Rate and Rhythm: Normal rate and regular rhythm.     Heart sounds: Normal heart sounds, S1 normal and S2 normal.  Pulmonary:     Effort: Pulmonary effort is normal.     Breath sounds: Normal breath sounds.  Skin:    General: Skin is warm and dry.  Neurological:     General: No focal deficit  present.     Mental Status: He is alert and oriented to person, place, and time.      Imaging: CLINICAL DATA:  64 year old male current smoker with 20 pack-year smoking history   EXAM: CT CHEST WITHOUT CONTRAST LOW-DOSE FOR LUNG CANCER SCREENING   TECHNIQUE: Multidetector CT imaging of the chest was performed following the standard protocol without IV contrast.   RADIATION DOSE REDUCTION: This exam was performed according to the departmental dose-optimization program which includes automated exposure control, adjustment of the mA and/or kV according to patient size and/or use of iterative reconstruction technique.   COMPARISON:  11/16/2021 chest CT angiogram.   FINDINGS: Cardiovascular: Normal heart size. No significant pericardial effusion/thickening. Three-vessel coronary atherosclerosis. Atherosclerotic thoracic aorta with dilated 4.1 cm ascending thoracic aorta. Normal caliber pulmonary arteries.   Mediastinum/Nodes: No significant thyroid nodules. Unremarkable esophagus. No pathologically enlarged axillary, mediastinal or hilar lymph nodes, noting limited sensitivity for the detection of hilar adenopathy on this noncontrast study.   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 No pneumothorax. No pleural effusion. Moderate paraseptal and centrilobular emphysema with diffuse bronchial wall thickening. No acute consolidative airspace disease or lung masses. No significant pulmonary nodules.  Upper abdomen: No acute abnormality.   Musculoskeletal: No aggressive appearing focal osseous lesions. Moderate thoracic spondylosis. Partially visualized right clavicle shaft fixation hardware. Partially visualized screw in the upper left scapula. Healed deformities in numerous posterior ribs bilaterally.   IMPRESSION: 1. Lung-RADS 1, negative. Continue annual screening with low-dose chest CT  without contrast in 12 months. 2. Dilated 4.1 cm ascending thoracic aorta, which can be reassessed on follow-up screening chest CT in 12 months. 3. Three-vessel coronary atherosclerosis. 4. 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 5. Aortic Atherosclerosis (ICD10-I70.0) and Emphysema (ICD10-J43.9).     Electronically Signed   By: Selinda DELENA Blue M.D.   On: 02/21/2024 08:05     A/P: Aneurysm of ascending aorta without rupture -4.1 cm ascending thoracic aortic aneurysm on CT scan of chest -We discussed the natural history and and risk factors for growth of ascending aortic aneurysms. Discussed recommendations to minimize the risk of further expansion or dissection including careful blood pressure control, avoidance of contact sports and heavy lifting, attention to lipid management.  We covered the importance of smoking cessation.  The patient does not yet meet surgical criteria of >5.5cm. The patient is aware of signs and symptoms of aortic dissection and when to present to the emergency department   -Follow up in one year with CTA of chest or with low dose CT scan for lung cancer screening for continued aortic surveillance    Risk Modification:  Statin:  atorvastatin    Smoking cessation instruction/counseling given:  counseled patient on the dangers of tobacco use, advised patient to stop smoking, and reviewed strategies to maximize success  Patient was counseled on importance of Blood Pressure Control  They are instructed to contact their Primary Care Physician if they start to have blood pressure readings over 130s/90s. Do not ever stop blood pressure medications on your own, unless instructed by healthcare professional.  Please avoid use of Fluoroquinolones as this can potentially increase your risk of Aortic Rupture and/or Dissection  Patient educated on signs and symptoms of Aortic Dissection, handout also provided in AVS  Manuelita CHRISTELLA Rough,  PA-C 03/23/24     [1]  Current Outpatient Medications on File Prior to Visit  Medication Sig Dispense Refill   acetaminophen  (TYLENOL ) 325 MG tablet Take 1-2 tablets (325-650 mg total) by mouth every 6 (six) hours as needed for mild pain. 100 tablet 0   atorvastatin  (LIPITOR) 20 MG tablet Take 1 tablet (20 mg total) by mouth daily. 90 tablet 1   diclofenac  Sodium (VOLTAREN ) 1 % GEL Apply 4 g topically 4 (four) times daily. 400 g 0   famotidine  (PEPCID ) 20 MG tablet Take 1 tablet (20 mg total) by mouth 2 (two) times daily. 60 tablet 3   folic acid  (FOLVITE ) 1 MG tablet Take 1 tablet (1 mg total) by mouth daily. 90 tablet 0   lidocaine  (LIDODERM ) 5 % Place 1 patch onto the skin daily. Remove & Discard patch within 12 hours or as directed by MD 30 patch 0   loratadine  (CLARITIN ) 10 MG tablet Take 1 tablet (10 mg total) by mouth daily. 30 tablet 5   polyethylene glycol powder (GLYCOLAX /MIRALAX ) 17 GM/SCOOP powder Take 17 g by mouth daily. 238 g 0   QUEtiapine  (SEROQUEL ) 100 MG tablet Take 1 tablet (100 mg total) by mouth at bedtime. 90 tablet 1   Skin Protectants, Misc. (MINERIN CREME) CREA Apply 1 Application topically daily. 113 g 10   thiamine  (VITAMIN B1) 100 MG  tablet Take 1 tablet (100 mg total) by mouth daily. 90 tablet 0   tiZANidine  (ZANAFLEX ) 2 MG tablet Take 1 tablet (2 mg total) by mouth every 8 (eight) hours as needed for muscle spasms. 30 tablet 0   VENTOLIN  HFA 108 (90 Base) MCG/ACT inhaler Inhale 2 puffs into the lungs every 6 (six) hours as needed for wheezing or shortness of breath. 18 g 5   vitamin D3 (CHOLECALCIFEROL ) 25 MCG tablet Take 1 tablet (1,000 Units total) by mouth daily. 30 tablet 0   fluticasone  (FLONASE ) 50 MCG/ACT nasal spray INSTILL 2 SPRAYS IN EACH NOSTRIL DAILY (Patient not taking: Reported on 03/20/2024) 16 g 10   No current facility-administered medications on file prior to visit.   "

## 2024-03-23 NOTE — Patient Instructions (Signed)
 Risk Modification in those with ascending thoracic aortic aneurysm:   Continue control of blood pressure (prefer BP 130/80 or less)   2. Avoid fluoroquinolone antibiotics (I.e Ciprofloxacin, Avelox, Levofloxacin, Ofloxacin)   3.  Use of statin (to decrease cardiovascular risk)   4.  Exercise and activity limitations is individualized, but in general, contact sports are to be avoided and one should avoid heavy lifting (defined as half of ideal body weight) and exercises involving sustained Valsalva maneuver.   5.  Follow-up in one year with CT chest.  OK to use a non-contrast CT if you have had a recent study for surveillance of the lung nodule.

## 2024-03-26 ENCOUNTER — Other Ambulatory Visit: Payer: Self-pay

## 2024-03-26 DIAGNOSIS — E785 Hyperlipidemia, unspecified: Secondary | ICD-10-CM

## 2024-03-27 ENCOUNTER — Ambulatory Visit: Payer: Self-pay | Admitting: Nurse Practitioner

## 2024-03-27 ENCOUNTER — Other Ambulatory Visit: Payer: Self-pay | Admitting: Nurse Practitioner

## 2024-03-27 DIAGNOSIS — E785 Hyperlipidemia, unspecified: Secondary | ICD-10-CM

## 2024-03-27 LAB — LIPID PANEL
Chol/HDL Ratio: 3.1 ratio (ref 0.0–5.0)
Cholesterol, Total: 194 mg/dL (ref 100–199)
HDL: 62 mg/dL
LDL Chol Calc (NIH): 116 mg/dL — ABNORMAL HIGH (ref 0–99)
Triglycerides: 90 mg/dL (ref 0–149)
VLDL Cholesterol Cal: 16 mg/dL (ref 5–40)

## 2024-03-27 MED ORDER — ATORVASTATIN CALCIUM 40 MG PO TABS: 40.0000 mg | ORAL_TABLET | Freq: Every day | ORAL | 2 refills | Status: AC

## 2024-05-25 ENCOUNTER — Ambulatory Visit: Payer: Self-pay | Admitting: Nurse Practitioner

## 2024-07-21 ENCOUNTER — Encounter: Payer: Self-pay | Admitting: Nurse Practitioner
